# Patient Record
Sex: Female | Born: 1940 | Race: White | Hispanic: No | Smoking: Former smoker
Health system: Southern US, Community
[De-identification: ages and names within clinical notes are randomized; demographics above are authoritative.]

## PROBLEM LIST (undated history)

## (undated) DIAGNOSIS — M7501 Adhesive capsulitis of right shoulder: Secondary | ICD-10-CM

## (undated) DIAGNOSIS — E785 Hyperlipidemia, unspecified: Secondary | ICD-10-CM

## (undated) DIAGNOSIS — J302 Other seasonal allergic rhinitis: Secondary | ICD-10-CM

## (undated) DIAGNOSIS — G4730 Sleep apnea, unspecified: Secondary | ICD-10-CM

## (undated) DIAGNOSIS — I10 Essential (primary) hypertension: Secondary | ICD-10-CM

## (undated) DIAGNOSIS — E119 Type 2 diabetes mellitus without complications: Secondary | ICD-10-CM

## (undated) DIAGNOSIS — M75110 Incomplete rotator cuff tear or rupture of unspecified shoulder, not specified as traumatic: Secondary | ICD-10-CM

## (undated) DIAGNOSIS — E039 Hypothyroidism, unspecified: Secondary | ICD-10-CM

## (undated) DIAGNOSIS — R3915 Urgency of urination: Secondary | ICD-10-CM

## (undated) DIAGNOSIS — I739 Peripheral vascular disease, unspecified: Secondary | ICD-10-CM

## (undated) DIAGNOSIS — M1712 Unilateral primary osteoarthritis, left knee: Secondary | ICD-10-CM

## (undated) DIAGNOSIS — Z973 Presence of spectacles and contact lenses: Secondary | ICD-10-CM

## (undated) DIAGNOSIS — T4145XA Adverse effect of unspecified anesthetic, initial encounter: Secondary | ICD-10-CM

## (undated) DIAGNOSIS — M1990 Unspecified osteoarthritis, unspecified site: Secondary | ICD-10-CM

## (undated) DIAGNOSIS — G629 Polyneuropathy, unspecified: Secondary | ICD-10-CM

## (undated) DIAGNOSIS — K219 Gastro-esophageal reflux disease without esophagitis: Secondary | ICD-10-CM

## (undated) DIAGNOSIS — M8580 Other specified disorders of bone density and structure, unspecified site: Secondary | ICD-10-CM

## (undated) HISTORY — PX: ABDOMINAL HYSTERECTOMY: SHX81

## (undated) HISTORY — PX: NOSE SURGERY: SHX723

## (undated) HISTORY — PX: CARPAL TUNNEL RELEASE: SHX101

## (undated) HISTORY — PX: HEEL SPUR EXCISION: SHX1733

## (undated) HISTORY — DX: Type 2 diabetes mellitus without complications: E11.9

## (undated) HISTORY — DX: Incomplete rotator cuff tear or rupture of unspecified shoulder, not specified as traumatic: M75.110

## (undated) HISTORY — PX: TONSILLECTOMY: SUR1361

## (undated) HISTORY — DX: Adhesive capsulitis of right shoulder: M75.01

## (undated) HISTORY — PX: CERVICAL FUSION: SHX112

## (undated) HISTORY — DX: Unilateral primary osteoarthritis, left knee: M17.12

## (undated) HISTORY — PX: VARICOSE VEIN SURGERY: SHX832

## (undated) HISTORY — PX: KNEE ARTHROSCOPY: SUR90

## (undated) HISTORY — PX: COLONOSCOPY W/ POLYPECTOMY: SHX1380

---

## 2007-11-09 HISTORY — PX: HEMORRHOID SURGERY: SHX153

## 2007-11-09 HISTORY — PX: CHOLECYSTECTOMY: SHX55

## 2010-11-08 HISTORY — PX: EYE SURGERY: SHX253

## 2011-06-14 ENCOUNTER — Other Ambulatory Visit: Payer: Self-pay | Admitting: Neurological Surgery

## 2011-06-14 DIAGNOSIS — R200 Anesthesia of skin: Secondary | ICD-10-CM

## 2011-06-14 DIAGNOSIS — M549 Dorsalgia, unspecified: Secondary | ICD-10-CM

## 2011-06-14 DIAGNOSIS — M79605 Pain in left leg: Secondary | ICD-10-CM

## 2011-06-17 ENCOUNTER — Ambulatory Visit
Admission: RE | Admit: 2011-06-17 | Discharge: 2011-06-17 | Disposition: A | Discharge status: Home / Self Care | Payer: Medicare Other | Source: Ambulatory Visit | Attending: Neurological Surgery | Admitting: Neurological Surgery

## 2011-06-17 DIAGNOSIS — R200 Anesthesia of skin: Secondary | ICD-10-CM

## 2011-06-17 DIAGNOSIS — M549 Dorsalgia, unspecified: Secondary | ICD-10-CM

## 2011-06-17 DIAGNOSIS — M79605 Pain in left leg: Secondary | ICD-10-CM

## 2011-06-17 MED ORDER — DIAZEPAM 2 MG PO TABS: 5.0000 mg | ORAL_TABLET | Freq: Once | ORAL | Status: DC

## 2011-06-17 MED ORDER — ONDANSETRON HCL 4 MG/2ML IJ SOLN
4.0000 mg | Freq: Once | INTRAMUSCULAR | Status: AC
  Administered 2011-06-17: 4 mg via INTRAMUSCULAR

## 2011-06-17 MED ORDER — IOHEXOL 180 MG/ML  SOLN
17.0000 mL | Freq: Once | INTRAMUSCULAR | Status: AC | PRN
  Administered 2011-06-17: 17 mL via INTRATHECAL

## 2011-06-17 MED ORDER — SODIUM CHLORIDE 0.9 % IV SOLN: 4.0000 mg | Freq: Four times a day (QID) | INTRAVENOUS | Status: DC | PRN

## 2011-06-17 MED ORDER — MEPERIDINE HCL 100 MG/ML IJ SOLN
75.0000 mg | Freq: Once | INTRAMUSCULAR | Status: AC
  Administered 2011-06-17: 75 mg via INTRAMUSCULAR

## 2011-11-11 DIAGNOSIS — M542 Cervicalgia: Secondary | ICD-10-CM | POA: Diagnosis not present

## 2011-11-11 DIAGNOSIS — IMO0001 Reserved for inherently not codable concepts without codable children: Secondary | ICD-10-CM | POA: Diagnosis not present

## 2011-11-11 DIAGNOSIS — M5412 Radiculopathy, cervical region: Secondary | ICD-10-CM | POA: Diagnosis not present

## 2011-11-15 DIAGNOSIS — M5412 Radiculopathy, cervical region: Secondary | ICD-10-CM | POA: Diagnosis not present

## 2011-11-15 DIAGNOSIS — IMO0001 Reserved for inherently not codable concepts without codable children: Secondary | ICD-10-CM | POA: Diagnosis not present

## 2011-11-15 DIAGNOSIS — M542 Cervicalgia: Secondary | ICD-10-CM | POA: Diagnosis not present

## 2011-11-16 DIAGNOSIS — M5412 Radiculopathy, cervical region: Secondary | ICD-10-CM | POA: Diagnosis not present

## 2011-11-19 DIAGNOSIS — M259 Joint disorder, unspecified: Secondary | ICD-10-CM | POA: Diagnosis not present

## 2011-11-19 DIAGNOSIS — M4802 Spinal stenosis, cervical region: Secondary | ICD-10-CM | POA: Diagnosis not present

## 2011-11-19 DIAGNOSIS — M5412 Radiculopathy, cervical region: Secondary | ICD-10-CM | POA: Diagnosis not present

## 2011-11-19 DIAGNOSIS — Z981 Arthrodesis status: Secondary | ICD-10-CM | POA: Diagnosis not present

## 2011-11-23 DIAGNOSIS — M5412 Radiculopathy, cervical region: Secondary | ICD-10-CM | POA: Diagnosis not present

## 2011-12-17 DIAGNOSIS — M47817 Spondylosis without myelopathy or radiculopathy, lumbosacral region: Secondary | ICD-10-CM | POA: Diagnosis not present

## 2011-12-17 DIAGNOSIS — M5412 Radiculopathy, cervical region: Secondary | ICD-10-CM | POA: Diagnosis not present

## 2011-12-20 ENCOUNTER — Other Ambulatory Visit: Payer: Self-pay | Admitting: Neurological Surgery

## 2011-12-20 DIAGNOSIS — M5410 Radiculopathy, site unspecified: Secondary | ICD-10-CM

## 2011-12-27 ENCOUNTER — Ambulatory Visit
Admission: RE | Admit: 2011-12-27 | Discharge: 2011-12-27 | Disposition: A | Discharge status: Home / Self Care | Payer: Medicare Other | Source: Ambulatory Visit | Attending: Neurological Surgery | Admitting: Neurological Surgery

## 2011-12-27 DIAGNOSIS — E119 Type 2 diabetes mellitus without complications: Secondary | ICD-10-CM | POA: Diagnosis not present

## 2011-12-27 DIAGNOSIS — M5410 Radiculopathy, site unspecified: Secondary | ICD-10-CM

## 2011-12-27 DIAGNOSIS — M5020 Other cervical disc displacement, unspecified cervical region: Secondary | ICD-10-CM | POA: Diagnosis not present

## 2011-12-27 DIAGNOSIS — M47812 Spondylosis without myelopathy or radiculopathy, cervical region: Secondary | ICD-10-CM | POA: Diagnosis not present

## 2011-12-27 DIAGNOSIS — E785 Hyperlipidemia, unspecified: Secondary | ICD-10-CM | POA: Diagnosis not present

## 2011-12-27 DIAGNOSIS — E079 Disorder of thyroid, unspecified: Secondary | ICD-10-CM | POA: Diagnosis not present

## 2011-12-27 DIAGNOSIS — I10 Essential (primary) hypertension: Secondary | ICD-10-CM | POA: Diagnosis not present

## 2011-12-27 DIAGNOSIS — M5030 Other cervical disc degeneration, unspecified cervical region: Secondary | ICD-10-CM | POA: Diagnosis not present

## 2011-12-27 MED ORDER — ONDANSETRON HCL 4 MG/2ML IJ SOLN: 4.0000 mg | Freq: Four times a day (QID) | INTRAMUSCULAR | Status: DC | PRN

## 2011-12-27 MED ORDER — DIAZEPAM 5 MG PO TABS
5.0000 mg | ORAL_TABLET | Freq: Once | ORAL | Status: AC
  Administered 2011-12-27: 5 mg via ORAL

## 2011-12-27 NOTE — Discharge Instructions (Signed)

## 2012-01-12 DIAGNOSIS — R1013 Epigastric pain: Secondary | ICD-10-CM | POA: Diagnosis not present

## 2012-01-12 DIAGNOSIS — K219 Gastro-esophageal reflux disease without esophagitis: Secondary | ICD-10-CM | POA: Diagnosis not present

## 2012-01-12 DIAGNOSIS — R143 Flatulence: Secondary | ICD-10-CM | POA: Diagnosis not present

## 2012-01-12 DIAGNOSIS — K5900 Constipation, unspecified: Secondary | ICD-10-CM | POA: Diagnosis not present

## 2012-01-12 DIAGNOSIS — R109 Unspecified abdominal pain: Secondary | ICD-10-CM | POA: Diagnosis not present

## 2012-02-08 DIAGNOSIS — R1084 Generalized abdominal pain: Secondary | ICD-10-CM | POA: Diagnosis not present

## 2012-02-08 DIAGNOSIS — R141 Gas pain: Secondary | ICD-10-CM | POA: Diagnosis not present

## 2012-02-08 DIAGNOSIS — R142 Eructation: Secondary | ICD-10-CM | POA: Diagnosis not present

## 2012-02-08 DIAGNOSIS — K219 Gastro-esophageal reflux disease without esophagitis: Secondary | ICD-10-CM | POA: Diagnosis not present

## 2012-02-08 DIAGNOSIS — K589 Irritable bowel syndrome without diarrhea: Secondary | ICD-10-CM | POA: Diagnosis not present

## 2012-02-18 DIAGNOSIS — M779 Enthesopathy, unspecified: Secondary | ICD-10-CM | POA: Diagnosis not present

## 2012-02-18 DIAGNOSIS — I10 Essential (primary) hypertension: Secondary | ICD-10-CM | POA: Diagnosis not present

## 2012-02-21 DIAGNOSIS — M4802 Spinal stenosis, cervical region: Secondary | ICD-10-CM | POA: Diagnosis not present

## 2012-02-23 ENCOUNTER — Encounter (HOSPITAL_COMMUNITY): Payer: Self-pay | Admitting: Pharmacy Technician

## 2012-03-01 ENCOUNTER — Encounter (HOSPITAL_COMMUNITY)
Admission: RE | Admit: 2012-03-01 | Discharge: 2012-03-01 | Disposition: A | Discharge status: Home / Self Care | Payer: Medicare Other | Source: Ambulatory Visit | Attending: Anesthesiology | Admitting: Anesthesiology

## 2012-03-01 ENCOUNTER — Encounter (HOSPITAL_COMMUNITY)
Admission: RE | Admit: 2012-03-01 | Discharge: 2012-03-01 | Disposition: A | Discharge status: Home / Self Care | Payer: Medicare Other | Source: Ambulatory Visit | Attending: Neurological Surgery | Admitting: Neurological Surgery

## 2012-03-01 ENCOUNTER — Encounter (HOSPITAL_COMMUNITY): Payer: Self-pay

## 2012-03-01 DIAGNOSIS — M47812 Spondylosis without myelopathy or radiculopathy, cervical region: Secondary | ICD-10-CM | POA: Diagnosis not present

## 2012-03-01 DIAGNOSIS — Z79899 Other long term (current) drug therapy: Secondary | ICD-10-CM | POA: Diagnosis not present

## 2012-03-01 DIAGNOSIS — E119 Type 2 diabetes mellitus without complications: Secondary | ICD-10-CM | POA: Diagnosis not present

## 2012-03-01 DIAGNOSIS — I10 Essential (primary) hypertension: Secondary | ICD-10-CM | POA: Diagnosis not present

## 2012-03-01 DIAGNOSIS — Z01811 Encounter for preprocedural respiratory examination: Secondary | ICD-10-CM | POA: Diagnosis not present

## 2012-03-01 DIAGNOSIS — M5412 Radiculopathy, cervical region: Secondary | ICD-10-CM | POA: Diagnosis not present

## 2012-03-01 DIAGNOSIS — Z01812 Encounter for preprocedural laboratory examination: Secondary | ICD-10-CM | POA: Diagnosis not present

## 2012-03-01 HISTORY — DX: Other seasonal allergic rhinitis: J30.2

## 2012-03-01 HISTORY — DX: Hypothyroidism, unspecified: E03.9

## 2012-03-01 HISTORY — DX: Sleep apnea, unspecified: G47.30

## 2012-03-01 HISTORY — DX: Polyneuropathy, unspecified: G62.9

## 2012-03-01 HISTORY — DX: Peripheral vascular disease, unspecified: I73.9

## 2012-03-01 HISTORY — DX: Gastro-esophageal reflux disease without esophagitis: K21.9

## 2012-03-01 HISTORY — DX: Unspecified osteoarthritis, unspecified site: M19.90

## 2012-03-01 HISTORY — DX: Essential (primary) hypertension: I10

## 2012-03-01 LAB — CBC
HCT: 42.9 % (ref 36.0–46.0)
Hemoglobin: 13.9 g/dL (ref 12.0–15.0)
MCV: 91.9 fL (ref 78.0–100.0)
RDW: 14.4 % (ref 11.5–15.5)
WBC: 5.5 10*3/uL (ref 4.0–10.5)

## 2012-03-01 LAB — BASIC METABOLIC PANEL
CO2: 28 mEq/L (ref 19–32)
Chloride: 106 mEq/L (ref 96–112)
Creatinine, Ser: 0.91 mg/dL (ref 0.50–1.10)
Potassium: 3.8 mEq/L (ref 3.5–5.1)

## 2012-03-01 LAB — SURGICAL PCR SCREEN: Staphylococcus aureus: NEGATIVE

## 2012-03-01 NOTE — Pre-Procedure Instructions (Signed)
[address] Bulah Lurie  03/01/2012   Your procedure is scheduled on:  Friday May 3  Report to [facility] at 5:30 AM.  Call this number if you have problems the morning of surgery: [phone]   Remember:   Do not eat food:After Midnight.  May have clear liquids: up to 4 Hours before arrival.  Clear liquids include soda, tea, black coffee, apple or grape juice, broth.  Take these medicines the morning of surgery with A SIP OF WATER: Norvasc, Dexilant, Neurontin, Synthroid. May use Patanase.   Do not wear jewelry, make-up or nail polish.  Do not wear lotions, powders, or perfumes. You may wear deodorant.  Do not shave 48 hours prior to surgery.  Do not bring valuables to the hospital.  Contacts, dentures or bridgework may not be worn into surgery.  Leave suitcase in the car. After surgery it may be brought to your room.  For patients admitted to the hospital, checkout time is 11:00 AM the day of discharge.   Patients discharged the day of surgery will not be allowed to drive home.  Name and phone number of your driver: NA  Special Instructions: CHG Shower Use Special Wash: 1/2 bottle night before surgery and 1/2 bottle morning of surgery.   Please read over the following fact sheets that you were given: Pain Booklet, Coughing and Deep Breathing and Surgical Site Infection Prevention

## 2012-03-01 NOTE — Progress Notes (Signed)
Former PCP in [place], [place] was Dr. Titus Dubin. ECHO, stress test ordered by him.   Pulmonologist Dr. Jonette Pesa in [place], [place] for Sleep study.  Will request above records.

## 2012-03-02 ENCOUNTER — Other Ambulatory Visit: Payer: Self-pay | Admitting: Neurological Surgery

## 2012-03-03 NOTE — Consult Note (Signed)
Anesthesia Chart Review:  Patient is a 71 year old female posted for a one level ACDF on 03/10/12.  History includes DM2, hypothyroidism, HTN, former smoker, GERD, OSA, PVD, arthritis, peripheral neuropathy, obesity with BMI 41, multiple surgical procedures including prior c-spine fusion, nasal surgery, and hysterectomy.  PCP has been Dr. Titus Dubin from [place], [place].  Her current address now is listed in [place], [place], however.   Pulmonologist (for OSA) is Dr. Jonette Pesa in [place], [place].  Her last sleep study was in 2010.  She was noted to have a good response to nasal CPAP @ 14cm of water at that time.  PFTs were WNL on 08/05/10.  EKG from 03/01/12 shows NSR with sinus arrhythmia, incomplete right BBB, inferior infarct (age undetermined), possible anterior infarct (age undetermined).  No CV symptoms were documented at her PAT visit.  We received records from Dr. Christa See office and [facility] in [place] today.  She had a nuclear stress test on 05/03/05 that showed no ischemia and EF 70%.  There were no EKGs available from that time.  She does have an EKG from 07/18/09 and 01/13/11 which are probably not significantly changed.      CXR report from 03/01/12 shows: "1. No acute cardiopulmonary abnormality.  2. Evidence of mild L2 compression deformity.  Per CMS PQRS reporting requirements (PQRS Measure 24): Given the patient's age of greater than 50 and the fracture site (hip, distal radius, or spine), the patient should be tested for osteoporosis using DXA, and the appropriate treatment considered based on the DXA results."  This was faxed to Dr. Yetta Barre' office with confirmation.  Labs acceptable.  I reviewed above with Anesthesiologist Dr. Noreene Larsson.  If she remains asymptomatic, then plan to proceed.  Shonna Chock, PA-C

## 2012-03-08 DIAGNOSIS — T8859XA Other complications of anesthesia, initial encounter: Secondary | ICD-10-CM

## 2012-03-08 HISTORY — DX: Other complications of anesthesia, initial encounter: T88.59XA

## 2012-03-09 MED ORDER — DEXAMETHASONE SODIUM PHOSPHATE 10 MG/ML IJ SOLN
10.0000 mg | INTRAMUSCULAR | Status: DC
  Filled 2012-03-09: qty 1

## 2012-03-09 MED ORDER — CEFAZOLIN SODIUM 1-5 GM-% IV SOLN: 1.0000 g | INTRAVENOUS | Status: DC

## 2012-03-09 MED ORDER — CEFAZOLIN SODIUM-DEXTROSE 2-3 GM-% IV SOLR
2.0000 g | INTRAVENOUS | Status: DC
  Filled 2012-03-09: qty 50

## 2012-03-10 ENCOUNTER — Encounter (HOSPITAL_COMMUNITY): Payer: Self-pay | Admitting: Neurological Surgery

## 2012-03-10 ENCOUNTER — Encounter (HOSPITAL_COMMUNITY)
Admission: RE | Disposition: A | Discharge status: Home / Self Care | Payer: Self-pay | Source: Ambulatory Visit | Attending: Neurological Surgery

## 2012-03-10 ENCOUNTER — Inpatient Hospital Stay (HOSPITAL_COMMUNITY)
Admission: RE | Admit: 2012-03-10 | Discharge: 2012-03-11 | DRG: 473 | Disposition: A | Discharge status: Home / Self Care | Payer: Medicare Other | Source: Ambulatory Visit | Attending: Neurological Surgery | Admitting: Neurological Surgery

## 2012-03-10 ENCOUNTER — Encounter (HOSPITAL_COMMUNITY): Payer: Self-pay | Admitting: Vascular Surgery

## 2012-03-10 ENCOUNTER — Inpatient Hospital Stay (HOSPITAL_COMMUNITY): Payer: Medicare Other | Admitting: Vascular Surgery

## 2012-03-10 ENCOUNTER — Encounter (HOSPITAL_COMMUNITY): Payer: Self-pay | Admitting: *Deleted

## 2012-03-10 ENCOUNTER — Inpatient Hospital Stay (HOSPITAL_COMMUNITY): Payer: Medicare Other

## 2012-03-10 DIAGNOSIS — I10 Essential (primary) hypertension: Secondary | ICD-10-CM | POA: Diagnosis present

## 2012-03-10 DIAGNOSIS — Z9849 Cataract extraction status, unspecified eye: Secondary | ICD-10-CM | POA: Diagnosis not present

## 2012-03-10 DIAGNOSIS — E039 Hypothyroidism, unspecified: Secondary | ICD-10-CM | POA: Diagnosis present

## 2012-03-10 DIAGNOSIS — Z981 Arthrodesis status: Secondary | ICD-10-CM | POA: Diagnosis not present

## 2012-03-10 DIAGNOSIS — Z01812 Encounter for preprocedural laboratory examination: Secondary | ICD-10-CM | POA: Diagnosis not present

## 2012-03-10 DIAGNOSIS — Z01810 Encounter for preprocedural cardiovascular examination: Secondary | ICD-10-CM | POA: Diagnosis not present

## 2012-03-10 DIAGNOSIS — M4802 Spinal stenosis, cervical region: Secondary | ICD-10-CM | POA: Diagnosis not present

## 2012-03-10 DIAGNOSIS — M5412 Radiculopathy, cervical region: Secondary | ICD-10-CM | POA: Diagnosis present

## 2012-03-10 DIAGNOSIS — G4730 Sleep apnea, unspecified: Secondary | ICD-10-CM | POA: Diagnosis present

## 2012-03-10 DIAGNOSIS — I739 Peripheral vascular disease, unspecified: Secondary | ICD-10-CM | POA: Diagnosis present

## 2012-03-10 DIAGNOSIS — M542 Cervicalgia: Secondary | ICD-10-CM | POA: Diagnosis not present

## 2012-03-10 DIAGNOSIS — Z87891 Personal history of nicotine dependence: Secondary | ICD-10-CM

## 2012-03-10 DIAGNOSIS — K219 Gastro-esophageal reflux disease without esophagitis: Secondary | ICD-10-CM | POA: Diagnosis present

## 2012-03-10 DIAGNOSIS — E119 Type 2 diabetes mellitus without complications: Secondary | ICD-10-CM | POA: Diagnosis present

## 2012-03-10 DIAGNOSIS — M47812 Spondylosis without myelopathy or radiculopathy, cervical region: Principal | ICD-10-CM | POA: Diagnosis present

## 2012-03-10 DIAGNOSIS — M129 Arthropathy, unspecified: Secondary | ICD-10-CM | POA: Diagnosis present

## 2012-03-10 DIAGNOSIS — Z79899 Other long term (current) drug therapy: Secondary | ICD-10-CM | POA: Diagnosis not present

## 2012-03-10 DIAGNOSIS — M79609 Pain in unspecified limb: Secondary | ICD-10-CM | POA: Diagnosis not present

## 2012-03-10 DIAGNOSIS — M5020 Other cervical disc displacement, unspecified cervical region: Secondary | ICD-10-CM | POA: Diagnosis not present

## 2012-03-10 HISTORY — PX: ANTERIOR CERVICAL DECOMP/DISCECTOMY FUSION: SHX1161

## 2012-03-10 LAB — GLUCOSE, CAPILLARY
Glucose-Capillary: 126 mg/dL — ABNORMAL HIGH (ref 70–99)
Glucose-Capillary: 212 mg/dL — ABNORMAL HIGH (ref 70–99)
Glucose-Capillary: 151 mg/dL — ABNORMAL HIGH (ref 70–99)

## 2012-03-10 LAB — PROTIME-INR: INR: 0.97 (ref 0.00–1.49)

## 2012-03-10 SURGERY — ANTERIOR CERVICAL DECOMPRESSION/DISCECTOMY FUSION 1 LEVEL
Anesthesia: General | Site: Neck | Wound class: Clean

## 2012-03-10 MED ORDER — ONDANSETRON HCL 4 MG/2ML IJ SOLN
INTRAMUSCULAR | Status: DC | PRN
  Administered 2012-03-10: 4 mg via INTRAVENOUS

## 2012-03-10 MED ORDER — ACETAMINOPHEN 325 MG PO TABS: 650.0000 mg | ORAL_TABLET | ORAL | Status: DC | PRN

## 2012-03-10 MED ORDER — BACITRACIN 50000 UNITS IM SOLR
INTRAMUSCULAR | Status: AC
  Filled 2012-03-10: qty 1

## 2012-03-10 MED ORDER — HYDROCODONE-ACETAMINOPHEN 5-325 MG PO TABS
1.0000 | ORAL_TABLET | ORAL | Status: DC | PRN
  Administered 2012-03-10 (×2): 2 via ORAL
  Filled 2012-03-10 (×2): qty 2

## 2012-03-10 MED ORDER — METFORMIN HCL ER 500 MG PO TB24
500.0000 mg | ORAL_TABLET | Freq: Every day | ORAL | Status: DC
  Filled 2012-03-10 (×2): qty 1

## 2012-03-10 MED ORDER — HEMOSTATIC AGENTS (NO CHARGE) OPTIME
TOPICAL | Status: DC | PRN
  Administered 2012-03-10: 1 via TOPICAL

## 2012-03-10 MED ORDER — 0.9 % SODIUM CHLORIDE (POUR BTL) OPTIME
TOPICAL | Status: DC | PRN
  Administered 2012-03-10: 1000 mL

## 2012-03-10 MED ORDER — EPHEDRINE SULFATE 50 MG/ML IJ SOLN
INTRAMUSCULAR | Status: DC | PRN
  Administered 2012-03-10 (×2): 10 mg via INTRAVENOUS
  Administered 2012-03-10: 5 mg via INTRAVENOUS
  Administered 2012-03-10 (×3): 10 mg via INTRAVENOUS

## 2012-03-10 MED ORDER — HYDROMORPHONE HCL PF 1 MG/ML IJ SOLN: 0.2500 mg | INTRAMUSCULAR | Status: DC | PRN

## 2012-03-10 MED ORDER — GABAPENTIN 300 MG PO CAPS
300.0000 mg | ORAL_CAPSULE | Freq: Every day | ORAL | Status: DC
  Filled 2012-03-10: qty 1

## 2012-03-10 MED ORDER — LACTATED RINGERS IV SOLN
INTRAVENOUS | Status: DC | PRN
  Administered 2012-03-10 (×2): via INTRAVENOUS

## 2012-03-10 MED ORDER — SODIUM CHLORIDE 0.9 % IJ SOLN: 3.0000 mL | INTRAMUSCULAR | Status: DC | PRN

## 2012-03-10 MED ORDER — HYDROCHLOROTHIAZIDE 12.5 MG PO CAPS
12.5000 mg | ORAL_CAPSULE | Freq: Every day | ORAL | Status: DC
  Administered 2012-03-10: 12.5 mg via ORAL
  Filled 2012-03-10 (×2): qty 1

## 2012-03-10 MED ORDER — LIDOCAINE HCL (CARDIAC) 20 MG/ML IV SOLN
INTRAVENOUS | Status: DC | PRN
  Administered 2012-03-10: 70 mg via INTRAVENOUS

## 2012-03-10 MED ORDER — MIDAZOLAM HCL 5 MG/5ML IJ SOLN
INTRAMUSCULAR | Status: DC | PRN
  Administered 2012-03-10: 2 mg via INTRAVENOUS

## 2012-03-10 MED ORDER — LIRAGLUTIDE 18 MG/3ML ~~LOC~~ SOLN: 18.0000 mg | Freq: Every day | SUBCUTANEOUS | Status: DC

## 2012-03-10 MED ORDER — DEXAMETHASONE 4 MG PO TABS: 4.0000 mg | ORAL_TABLET | Freq: Four times a day (QID) | ORAL | Status: DC

## 2012-03-10 MED ORDER — THROMBIN 5000 UNITS EX KIT
PACK | CUTANEOUS | Status: DC | PRN
  Administered 2012-03-10 (×2): 5000 [IU] via TOPICAL

## 2012-03-10 MED ORDER — FENTANYL CITRATE 0.05 MG/ML IJ SOLN
INTRAMUSCULAR | Status: DC | PRN
  Administered 2012-03-10 (×2): 100 ug via INTRAVENOUS
  Administered 2012-03-10: 50 ug via INTRAVENOUS

## 2012-03-10 MED ORDER — DEXAMETHASONE SODIUM PHOSPHATE 4 MG/ML IJ SOLN: 4.0000 mg | Freq: Four times a day (QID) | INTRAMUSCULAR | Status: DC

## 2012-03-10 MED ORDER — IRBESARTAN 75 MG PO TABS
75.0000 mg | ORAL_TABLET | Freq: Every day | ORAL | Status: DC
  Administered 2012-03-10: 75 mg via ORAL
  Filled 2012-03-10 (×2): qty 1

## 2012-03-10 MED ORDER — ACETAMINOPHEN 650 MG RE SUPP: 650.0000 mg | RECTAL | Status: DC | PRN

## 2012-03-10 MED ORDER — THROMBIN 5000 UNITS EX SOLR
OROMUCOSAL | Status: DC | PRN
  Administered 2012-03-10: 09:00:00 via TOPICAL

## 2012-03-10 MED ORDER — BUPIVACAINE HCL (PF) 0.25 % IJ SOLN
INTRAMUSCULAR | Status: DC | PRN
  Administered 2012-03-10: 4 mL

## 2012-03-10 MED ORDER — CEFAZOLIN SODIUM 1-5 GM-% IV SOLN
1.0000 g | Freq: Three times a day (TID) | INTRAVENOUS | Status: AC
Start: 2012-03-10 — End: 2012-03-11
  Administered 2012-03-10 – 2012-03-11 (×2): 1 g via INTRAVENOUS
  Filled 2012-03-10 (×2): qty 50

## 2012-03-10 MED ORDER — SODIUM CHLORIDE 0.9 % IV SOLN
INTRAVENOUS | Status: AC
  Filled 2012-03-10: qty 500

## 2012-03-10 MED ORDER — LEVOTHYROXINE SODIUM 100 MCG PO TABS
100.0000 ug | ORAL_TABLET | Freq: Every day | ORAL | Status: DC
  Administered 2012-03-11: 100 ug via ORAL
  Filled 2012-03-10 (×2): qty 1

## 2012-03-10 MED ORDER — PHENOL 1.4 % MT LIQD: 1.0000 | OROMUCOSAL | Status: DC | PRN

## 2012-03-10 MED ORDER — FENOFIBRATE 160 MG PO TABS
160.0000 mg | ORAL_TABLET | Freq: Every day | ORAL | Status: DC
  Administered 2012-03-10: 160 mg via ORAL
  Filled 2012-03-10 (×2): qty 1

## 2012-03-10 MED ORDER — ONDANSETRON HCL 4 MG/2ML IJ SOLN: 4.0000 mg | Freq: Once | INTRAMUSCULAR | Status: DC | PRN

## 2012-03-10 MED ORDER — HYDROMORPHONE HCL PF 1 MG/ML IJ SOLN: 0.5000 mg | INTRAMUSCULAR | Status: DC | PRN

## 2012-03-10 MED ORDER — MENTHOL 3 MG MT LOZG: 1.0000 | LOZENGE | OROMUCOSAL | Status: DC | PRN

## 2012-03-10 MED ORDER — PANTOPRAZOLE SODIUM 40 MG PO TBEC: 40.0000 mg | DELAYED_RELEASE_TABLET | Freq: Every day | ORAL | Status: DC

## 2012-03-10 MED ORDER — ONDANSETRON HCL 4 MG/2ML IJ SOLN: 4.0000 mg | INTRAMUSCULAR | Status: DC | PRN

## 2012-03-10 MED ORDER — SODIUM CHLORIDE 0.9 % IR SOLN
Status: DC | PRN
  Administered 2012-03-10: 09:00:00

## 2012-03-10 MED ORDER — ROCURONIUM BROMIDE 100 MG/10ML IV SOLN
INTRAVENOUS | Status: DC | PRN
  Administered 2012-03-10: 50 mg via INTRAVENOUS

## 2012-03-10 MED ORDER — LIRAGLUTIDE 18 MG/3ML ~~LOC~~ SOLN
1.2000 mg | Freq: Every day | SUBCUTANEOUS | Status: DC
  Administered 2012-03-10: 1.2 mg via SUBCUTANEOUS

## 2012-03-10 MED ORDER — GLYCOPYRROLATE 0.2 MG/ML IJ SOLN
INTRAMUSCULAR | Status: DC | PRN
  Administered 2012-03-10: .7 mg via INTRAVENOUS

## 2012-03-10 MED ORDER — POTASSIUM CHLORIDE IN NACL 20-0.9 MEQ/L-% IV SOLN
INTRAVENOUS | Status: DC
  Administered 2012-03-10: 14:00:00 via INTRAVENOUS
  Filled 2012-03-10 (×3): qty 1000

## 2012-03-10 MED ORDER — PROPOFOL 10 MG/ML IV EMUL
INTRAVENOUS | Status: DC | PRN
  Administered 2012-03-10: 200 mg via INTRAVENOUS

## 2012-03-10 MED ORDER — AMLODIPINE BESYLATE 10 MG PO TABS
10.0000 mg | ORAL_TABLET | Freq: Every day | ORAL | Status: DC
  Filled 2012-03-10: qty 1

## 2012-03-10 MED ORDER — SODIUM CHLORIDE 0.9 % IJ SOLN
3.0000 mL | Freq: Two times a day (BID) | INTRAMUSCULAR | Status: DC
  Administered 2012-03-10 (×2): 3 mL via INTRAVENOUS

## 2012-03-10 MED ORDER — NEOSTIGMINE METHYLSULFATE 1 MG/ML IJ SOLN
INTRAMUSCULAR | Status: DC | PRN
  Administered 2012-03-10: 4 mg via INTRAVENOUS

## 2012-03-10 MED ORDER — NALOXONE HCL 0.4 MG/ML IJ SOLN
INTRAMUSCULAR | Status: DC | PRN
  Administered 2012-03-10: .1 mg via INTRAVENOUS
  Administered 2012-03-10: 0.1 mg via INTRAVENOUS

## 2012-03-10 SURGICAL SUPPLY — 55 items
BAG DECANTER FOR FLEXI CONT (MISCELLANEOUS) ×2 IMPLANT
BENZOIN TINCTURE PRP APPL 2/3 (GAUZE/BANDAGES/DRESSINGS) ×2 IMPLANT
BUR MATCHSTICK NEURO 3.0 LAGG (BURR) ×2 IMPLANT
CANISTER SUCTION 2500CC (MISCELLANEOUS) ×2 IMPLANT
CLOTH BEACON ORANGE TIMEOUT ST (SAFETY) ×2 IMPLANT
CONT SPEC 4OZ CLIKSEAL STRL BL (MISCELLANEOUS) ×2 IMPLANT
CoRoent Cage 9x13x15 ×2 IMPLANT
DRAPE C-ARM 42X72 X-RAY (DRAPES) ×4 IMPLANT
DRAPE LAPAROTOMY 100X72 PEDS (DRAPES) ×2 IMPLANT
DRAPE MICROSCOPE ZEISS OPMI (DRAPES) ×2 IMPLANT
DRAPE POUCH INSTRU U-SHP 10X18 (DRAPES) ×2 IMPLANT
DRESSING TELFA 8X3 (GAUZE/BANDAGES/DRESSINGS) ×2 IMPLANT
DRILL BIT HELIX (BIT) ×2 IMPLANT
DRSG OPSITE 4X5.5 SM (GAUZE/BANDAGES/DRESSINGS) ×2 IMPLANT
DURAPREP 6ML APPLICATOR 50/CS (WOUND CARE) ×2 IMPLANT
ELECT COATED BLADE 2.86 ST (ELECTRODE) ×2 IMPLANT
ELECT REM PT RETURN 9FT ADLT (ELECTROSURGICAL) ×2
ELECTRODE REM PT RTRN 9FT ADLT (ELECTROSURGICAL) ×1 IMPLANT
GAUZE SPONGE 4X4 16PLY XRAY LF (GAUZE/BANDAGES/DRESSINGS) IMPLANT
GLOVE BIO SURGEON STRL SZ8 (GLOVE) ×2 IMPLANT
GLOVE BIOGEL PI IND STRL 6.5 (GLOVE) ×1 IMPLANT
GLOVE BIOGEL PI IND STRL 7.0 (GLOVE) ×1 IMPLANT
GLOVE BIOGEL PI INDICATOR 6.5 (GLOVE) ×1
GLOVE BIOGEL PI INDICATOR 7.0 (GLOVE) ×1
GLOVE ECLIPSE 8.5 STRL (GLOVE) ×2 IMPLANT
GLOVE SS BIOGEL STRL SZ 6.5 (GLOVE) ×2 IMPLANT
GLOVE SUPERSENSE BIOGEL SZ 6.5 (GLOVE) ×2
GLOVE SURG SS PI 6.5 STRL IVOR (GLOVE) ×4 IMPLANT
GOWN BRE IMP SLV AUR LG STRL (GOWN DISPOSABLE) ×4 IMPLANT
GOWN BRE IMP SLV AUR XL STRL (GOWN DISPOSABLE) ×2 IMPLANT
GOWN STRL REIN 2XL LVL4 (GOWN DISPOSABLE) ×2 IMPLANT
HEMOSTAT POWDER KIT SURGIFOAM (HEMOSTASIS) ×2 IMPLANT
KIT BASIN OR (CUSTOM PROCEDURE TRAY) ×2 IMPLANT
KIT ROOM TURNOVER OR (KITS) ×2 IMPLANT
NEEDLE HYPO 25X1 1.5 SAFETY (NEEDLE) ×2 IMPLANT
NEEDLE SPNL 20GX3.5 QUINCKE YW (NEEDLE) ×2 IMPLANT
NS IRRIG 1000ML POUR BTL (IV SOLUTION) ×2 IMPLANT
PACK LAMINECTOMY NEURO (CUSTOM PROCEDURE TRAY) ×2 IMPLANT
PAD ARMBOARD 7.5X6 YLW CONV (MISCELLANEOUS) ×2 IMPLANT
PLATE HELIX-R 24MM (Plate) ×2 IMPLANT
PUTTY ABX ACTIFUSE 1.5ML (Putty) ×2 IMPLANT
RUBBERBAND STERILE (MISCELLANEOUS) ×4 IMPLANT
SCREW 4.0X13 (Screw) ×2 IMPLANT
SCREW 4.0X13MM (Screw) ×2 IMPLANT
SCREW HELIX R (Screw) ×4 IMPLANT
SPONGE GAUZE 4X4 12PLY (GAUZE/BANDAGES/DRESSINGS) ×2 IMPLANT
SPONGE INTESTINAL PEANUT (DISPOSABLE) ×2 IMPLANT
SPONGE SURGIFOAM ABS GEL SZ50 (HEMOSTASIS) ×2 IMPLANT
STRIP CLOSURE SKIN 1/2X4 (GAUZE/BANDAGES/DRESSINGS) ×2 IMPLANT
SUT VIC AB 3-0 SH 8-18 (SUTURE) ×4 IMPLANT
SYR 20ML ECCENTRIC (SYRINGE) IMPLANT
TOWEL OR 17X24 6PK STRL BLUE (TOWEL DISPOSABLE) ×2 IMPLANT
TOWEL OR 17X26 10 PK STRL BLUE (TOWEL DISPOSABLE) ×2 IMPLANT
TRAP SPECIMEN MUCOUS 40CC (MISCELLANEOUS) ×2 IMPLANT
WATER STERILE IRR 1000ML POUR (IV SOLUTION) ×2 IMPLANT

## 2012-03-10 NOTE — Anesthesia Postprocedure Evaluation (Signed)
  Anesthesia Post-op Note  Patient: Megan Horton  Procedure(s) Performed: Procedure(s) (LRB): ANTERIOR CERVICAL DECOMPRESSION/DISCECTOMY FUSION 1 LEVEL (N/A)  Patient Location: PACU  Anesthesia Type: General  Level of Consciousness: awake, alert , oriented and patient cooperative  Airway and Oxygen Therapy: Patient Spontanous Breathing and Patient connected to nasal cannula oxygen  Post-op Pain: mild  Post-op Assessment: Post-op Vital signs reviewed, Patient's Cardiovascular Status Stable, Respiratory Function Stable, Patent Airway, No signs of Nausea or vomiting and Pain level controlled  Post-op Vital Signs: stable  Complications: No apparent anesthesia complications

## 2012-03-10 NOTE — Op Note (Signed)
03/10/2012  9:29 AM  PATIENT:  Megan Horton  71 y.o. 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 Decompressive anterior cervical discectomy C5-6, 2. Anterior cervical arthrodesis C5-6 utilizing a 9 mm peek interbody cage packed with local autograft and morcellized allograft, 3. Anterior cervical plating C5-6 utilizing a NUvasive plate  SURGEON:  Marikay Alar, MD  ASSISTANTS: Dr. Jordan Likes  ANESTHESIA:   General  EBL: 20 ml  Total I/O In: 1000 [I.V.:1000] Out: -   BLOOD ADMINISTERED:none  DRAINS: None   SPECIMEN:  No Specimen  INDICATION FOR PROCEDURE: Patient presented with neck and right arm pain of long duration, unresponsive to medical therapy. CT myelogram showed calcified midline protrusion with probable OPLL and stenosis with cord compression. I recommended a decompressive anterior cervical discectomy fusion plating at C5-6. Patient understood the risks, benefits, and alternatives and potential outcomes and wished to proceed.  PROCEDURE DETAILS: Patient was brought to the operating room placed under general endotracheal anesthesia. Patient was placed in the supine position on the operating room table. The neck was prepped with Duraprep and draped in a sterile fashion.   Three cc of local anesthesia was injected and a transverse incision was made on the right side of the neck.  Dissection was carried down thru the subcutaneous tissue and the platysma was  elevated, opened, and undermined with Metzenbaum scissors.  Dissection was then carried out thru an avascular plane leaving the sternocleidomastoid carotid artery and jugular vein laterally and the trachea and esophagus medially. The ventral aspect of the vertebral column was identified and a localizing x-ray was taken. The C5-6 level was identified. The longus colli muscles were then elevated and the retractor  was placed. The annulus was incised and the disc space entered. Discectomy was performed with micro-curettes and pituitary rongeurs. I then used the high-speed drill to drill the endplates down to the level of the posterior longitudinal ligament. The drill shavings were saved in a mucous trap for later arthrodesis. The operating microscope was draped and brought into the field provided additional magnification, illumination and visualization. Discectomy was continued posteriorly thru the disc space. Posterior longitudinal ligament was opened with a nerve hook laterally on her right side and laterally on her left side, and then removed along with disc herniation and osteophytes, decompressing the spinal canal and thecal sac. There was a large midline calcified segment that was compressing the dura in the midline. We loosened this by undercutting the vertebral body superiorly and inferiorly, and working between it and the dura with a micro-nerve hook. Finally we were able to remove this calcified ligament and disc complex and the dura relaxed. We then continued to remove osteophytic overgrowth and disc material decompressing the neural foramina and exiting nerve roots bilaterally. The scope was angled up and down to help decompress and undercut the vertebral bodies. Once the decompression was completed we could pass a nerve hook circumferentially to assure adequate decompression in the midline and in the neural foramina. So by both visualization and palpation we felt we had an adequate decompression of the neural elements. We then measured the height of the intravertebral disc space and selected a 9 millimeter Peek interbody cage packed with autograft and morcellized allograft. It was then gently positioned in the intravertebral disc space and countersunk. I then used a 24 mm  plate and placed four variable angle screws into the vertebral bodies  and locked them into position. The wound was irrigated with bacitracin  solution, checked for hemostasis which was established and confirmed. Once meticulous hemostasis was achieved, we then proceeded with closure. The platysma was closed with interrupted 3-0 undyed Vicryl suture, the subcuticular layer was closed with interrupted 3-0 undyed Vicryl suture. The skin edges were approximated with steristrips. The drapes were removed. A sterile dressing was applied. The patient was then awakened from general anesthesia and transferred to the recovery room in stable condition. At the end of the procedure all sponge, needle and instrument counts were correct.   PLAN OF CARE: Admit to inpatient   PATIENT DISPOSITION:  PACU - hemodynamically stable.   Delay start of Pharmacological VTE agent (>24hrs) due to surgical blood loss or risk of bleeding:  yes

## 2012-03-10 NOTE — Anesthesia Procedure Notes (Signed)
Procedure Name: Intubation Date/Time: 03/10/2012 7:51 AM Performed by: Rossie Muskrat L Pre-anesthesia Checklist: Patient identified, Patient being monitored, Timeout performed, Emergency Drugs available and Suction available Patient Re-evaluated:Patient Re-evaluated prior to inductionOxygen Delivery Method: Circle system utilized Preoxygenation: Pre-oxygenation with 100% oxygen Intubation Type: IV induction Ventilation: Mask ventilation without difficulty and Oral airway inserted - appropriate to patient size Laryngoscope Size: Miller and 2 Grade View: Grade I Number of attempts: 1 Airway Equipment and Method: Stylet Placement Confirmation: ETT inserted through vocal cords under direct vision,  breath sounds checked- equal and bilateral and positive ETCO2 Secured at: 22 cm Tube secured with: Tape Dental Injury: Teeth and Oropharynx as per pre-operative assessment

## 2012-03-10 NOTE — Progress Notes (Signed)
Pt. Disoriented and swinging arms, slapping at staff, sats 89, on face mask at 10 liters, will follow commands.Marland Kitchen

## 2012-03-10 NOTE — Progress Notes (Signed)
Spoke with Dr. Yetta Barre about Diff and PT ordered that were not done at PAT since orders were put in after PAT appt. Dr. Yetta Barre was okay with Diff not being done but wanted PT done.

## 2012-03-10 NOTE — Anesthesia Preprocedure Evaluation (Addendum)
Anesthesia Evaluation  Patient identified by MRN, date of birth, ID band Patient awake    Reviewed: Allergy & Precautions, H&P , NPO status , Patient's Chart, lab work & pertinent test results  Airway Mallampati: III TM Distance: >3 FB Neck ROM: Full    Dental  (+) Teeth Intact   Pulmonary sleep apnea and Continuous Positive Airway Pressure Ventilation ,          Cardiovascular hypertension, Pt. on medications Rhythm:regular Rate:Normal     Neuro/Psych  Neuromuscular disease    GI/Hepatic GERD-  Medicated,  Endo/Other  Diabetes mellitus-, Well Controlled, Type 2Hypothyroidism   Renal/GU      Musculoskeletal   Abdominal   Peds  Hematology   Anesthesia Other Findings   Reproductive/Obstetrics                         Anesthesia Physical Anesthesia Plan  ASA: III  Anesthesia Plan: General   Post-op Pain Management:    Induction: Intravenous  Airway Management Planned: Oral ETT  Additional Equipment:   Intra-op Plan:   Post-operative Plan: Extubation in OR  Informed Consent: I have reviewed the patients History and Physical, chart, labs and discussed the procedure including the risks, benefits and alternatives for the proposed anesthesia with the patient or authorized representative who has indicated his/her understanding and acceptance.     Plan Discussed with: CRNA, Anesthesiologist and Surgeon  Anesthesia Plan Comments:         Anesthesia Quick Evaluation

## 2012-03-10 NOTE — Transfer of Care (Signed)
Immediate Anesthesia Transfer of Care Note  Patient: Megan Horton  Procedure(s) Performed: Procedure(s) (LRB): ANTERIOR CERVICAL DECOMPRESSION/DISCECTOMY FUSION 1 LEVEL (N/A)  Patient Location: PACU  Anesthesia Type: General  Level of Consciousness: awake, alert  and oriented  Airway & Oxygen Therapy: Patient Spontanous Breathing and Patient connected to face mask oxygen  Post-op Assessment: Report given to PACU RN, Post -op Vital signs reviewed and stable and Patient moving all extremities  Post vital signs: Reviewed and stable  Complications: No apparent anesthesia complications

## 2012-03-10 NOTE — Preoperative (Signed)
Beta Blockers   Reason not to administer Beta Blockers:Not Applicable 

## 2012-03-10 NOTE — Progress Notes (Signed)
UR COMPLETED  

## 2012-03-10 NOTE — Progress Notes (Signed)
pts sats 90 on 10 liter mask, Dr. Katrinka Blazing aware

## 2012-03-10 NOTE — H&P (Signed)
Subjective:   Patient is a 71 y.o. female admitted for ACDF. The patient first presented to me with complaints of neck pain. Onset of symptoms was several months ago. The pain is described as aching and throbbing and occurs intermittently. The pain is rated moderate, and is located at the neck and radiates to the arms. The symptoms have been progressive. Symptoms are exacerbated by extending head backwards, and are relieved by meds.  Previous work up includes MRI of cervical spine, results: spondylosis with stenosis.  Past Medical History  Diagnosis Date  . Hypertension   . Peripheral vascular disease   . Peripheral neuropathy   . Seasonal allergies   . Diabetes mellitus   . Hypothyroidism   . GERD (gastroesophageal reflux disease)   . Arthritis   . Sleep apnea     CPAP, sleep study 3-4 years ago in [place], [place] Dr. Horace Porteous     Past Surgical History  Procedure Date  . Abdominal hysterectomy   . Cholecystectomy 2009  . Hemorrhoid surgery 2009  . Eye surgery 2012    bilat cataract  . Knee arthroscopy     Left x2  . Carpal tunnel release     bilat  . Cervical fusion   . Tonsillectomy   . Varicose vein surgery     Right  . Heel spur excision     bilat  . Nose surgery     No Known Allergies  History  Substance Use Topics  . Smoking status: Former Smoker -- 0.2 packs/day for 16 years    Types: Cigarettes    Quit date: 12/26/1993  . Smokeless tobacco: Never Used  . Alcohol Use: No     very rarely wine    History reviewed. No pertinent family history. Prior to Admission medications   Medication Sig Start Date End Date Taking? Authorizing Provider  amLODipine (NORVASC) 10 MG tablet Take 10 mg by mouth daily.   Yes Historical Provider, MD  Cholecalciferol (VITAMIN D) 2000 UNITS CAPS Take 1 capsule by mouth daily.   Yes Historical Provider, MD  dexlansoprazole (DEXILANT) 60 MG capsule Take 60 mg by mouth daily.   Yes Historical Provider, MD  fenofibrate 160 MG  tablet Take 160 mg by mouth daily.   Yes Historical Provider, MD  gabapentin (NEURONTIN) 300 MG capsule Take 300 mg by mouth daily.   Yes Historical Provider, MD  hydrochlorothiazide (MICROZIDE) 12.5 MG capsule Take 12.5 mg by mouth daily.   Yes Historical Provider, MD  Lactobacillus-Inulin (CULTURELLE) CAPS Take 1 capsule by mouth daily.   Yes Historical Provider, MD  levothyroxine (SYNTHROID, LEVOTHROID) 100 MCG tablet Take 100 mcg by mouth daily.   Yes Historical Provider, MD  Liraglutide (VICTOZA) 18 MG/3ML SOLN Inject 18 mg into the skin daily.   Yes Historical Provider, MD  lovastatin (MEVACOR) 40 MG tablet Take 40 mg by mouth at bedtime.   Yes Historical Provider, MD  meloxicam (MOBIC) 7.5 MG tablet Take 7.5 mg by mouth daily.   Yes Historical Provider, MD  metFORMIN (GLUCOPHAGE-XR) 500 MG 24 hr tablet Take 500 mg by mouth daily with breakfast.   Yes Historical Provider, MD  Multiple Vitamins-Minerals (CENTRUM SILVER PO) Take 1 tablet by mouth daily.   Yes Historical Provider, MD  Olopatadine HCl (PATANASE NA) Place 1 spray into the nose 2 (two) times daily.   Yes Historical Provider, MD  Omega-3 Fatty Acids (FISH OIL) 1000 MG CAPS Take 1 capsule by mouth daily.   Yes Historical Provider, MD  telmisartan (MICARDIS) 80 MG tablet Take 80 mg by mouth daily.   Yes Historical Provider, MD  tetrahydrozoline (VISINE) 0.05 % ophthalmic solution Place 1 drop into both eyes 2 (two) times daily as needed. Allergy eyes   Yes Historical Provider, MD     Review of Systems  Positive ROS: neg  All other systems have been reviewed and were otherwise negative with the exception of those mentioned in the HPI and as above.  Objective: Vital signs in last 24 hours: Temp:  [97.9 F (36.6 C)] 97.9 F (36.6 C) (05/03 0602) Pulse Rate:  [63] 63  (05/03 0602) Resp:  [20] 20  (05/03 0602) BP: (133)/(69) 133/69 mmHg (05/03 0602) SpO2:  [96 %] 96 % (05/03 0602)  General Appearance: Alert, cooperative, no  distress, appears stated age Head: Normocephalic, without obvious abnormality, atraumatic Eyes: PERRL, conjunctiva/corneas clear, EOM's intact, fundi benign, both eyes      Ears: Normal TM's and external ear canals, both ears Throat: Lips, mucosa, and tongue normal; teeth and gums normal Neck: Supple, symmetrical, trachea midline, no adenopathy; thyroid: No enlargement/tenderness/nodules; no carotid bruit or JVD Back: Symmetric, no curvature, ROM normal, no CVA tenderness Lungs: Clear to auscultation bilaterally, respirations unlabored Heart: Regular rate and rhythm, S1 and S2 normal, no murmur, rub or gallop Abdomen: Soft, non-tender, bowel sounds active all four quadrants, no masses, no organomegaly Extremities: Extremities normal, atraumatic, no cyanosis or edema Pulses: 2+ and symmetric all extremities Skin: Skin color, texture, turgor normal, no rashes or lesions  NEUROLOGIC:  Mental status: Alert and oriented x4, no aphasia, good attention span, fund of knowledge and memory  Motor Exam - grossly normal Sensory Exam - grossly normal Reflexes: 1+ Coordination - grossly normal Gait - grossly normal Balance - grossly normal Cranial Nerves: I: smell Not tested  II: visual acuity  OS: nl    OD: nl  II: visual fields Full to confrontation  II: pupils Equal, round, reactive to light  III,VII: ptosis None  III,IV,VI: extraocular muscles  Full ROM  V: mastication Normal  V: facial light touch sensation  Normal  V,VII: corneal reflex  Present  VII: facial muscle function - upper  Normal  VII: facial muscle function - lower Normal  VIII: hearing Not tested  IX: soft palate elevation  Normal  IX,X: gag reflex Present  XI: trapezius strength  5/5  XI: sternocleidomastoid strength 5/5  XI: neck flexion strength  5/5  XII: tongue strength  Normal    Data Review Lab Results  Component Value Date   WBC 5.5 03/01/2012   HGB 13.9 03/01/2012   HCT 42.9 03/01/2012   MCV 91.9 03/01/2012     PLT 172 03/01/2012   Lab Results  Component Value Date   NA 143 03/01/2012   K 3.8 03/01/2012   CL 106 03/01/2012   CO2 28 03/01/2012   BUN 13 03/01/2012   CREATININE 0.91 03/01/2012   GLUCOSE 109* 03/01/2012   No results found for this basename: INR, PROTIME    Assessment:   Cervical neck pain with herniated nucleus pulposus/ spondylosis/ stenosis at C5-6. Patient has failed conservative therapy. Planned surgery : ACDF  Plan:   I explained the condition and procedure to the patient and answered any questions.  Patient wishes to proceed with procedure as planned. Understands risks/ benefits/ and expected or typical outcomes.  Megan Horton S 03/10/2012 7:12 AM

## 2012-03-11 MED ORDER — HYDROCODONE-ACETAMINOPHEN 5-325 MG PO TABS: 1.0000 | ORAL_TABLET | ORAL | Status: AC | PRN

## 2012-03-11 NOTE — Discharge Summary (Signed)
  Physician Discharge Summary  Patient ID: Danyle Boening MRN: [id number] DOB/AGE: February 18, 1941 71 y.o.  Admit date: 03/10/2012 Discharge date: 03/11/2012  Admission Diagnoses: Cervical spondylosis and stenosis with radiculopathy  Discharge Diagnoses: Same Active Problems:  * No active hospital problems. *    Discharged Condition: good  Hospital Course: Patient was admitted to the hospital underwent the aforementioned procedure postoperative patient did very well recovered in the floor on the floor she convalesced well was angling and voiding the posterior day 1 tolerating a regular diet arm pain was significantly improved and is is clean and dry she was having some mild swelling difficulty but still unable to tolerate her diet the  Consults: Significant Diagnostic Studies: Treatments: ACDF Discharge Exam: Blood pressure 109/50, pulse 83, temperature 98.1 F (36.7 C), temperature source Oral, resp. rate 16, SpO2 95.00%. Strength out of 5 wound clean and dry  Disposition: Home   Medication List  As of 03/11/2012  7:09 AM   TAKE these medications         amLODipine 10 MG tablet   Commonly known as: NORVASC   Take 10 mg by mouth daily.      CENTRUM SILVER PO   Take 1 tablet by mouth daily.      CULTURELLE Caps   Take 1 capsule by mouth daily.      dexlansoprazole 60 MG capsule   Commonly known as: DEXILANT   Take 60 mg by mouth daily.      fenofibrate 160 MG tablet   Take 160 mg by mouth daily.      Fish Oil 1000 MG Caps   Take 1 capsule by mouth daily.      gabapentin 300 MG capsule   Commonly known as: NEURONTIN   Take 300 mg by mouth daily.      hydrochlorothiazide 12.5 MG capsule   Commonly known as: MICROZIDE   Take 12.5 mg by mouth daily.      HYDROcodone-acetaminophen 5-325 MG per tablet   Commonly known as: NORCO   Take 1-2 tablets by mouth every 4 (four) hours as needed.      levothyroxine 100 MCG tablet   Commonly known as: SYNTHROID, LEVOTHROID   Take  100 mcg by mouth daily.      lovastatin 40 MG tablet   Commonly known as: MEVACOR   Take 40 mg by mouth at bedtime.      meloxicam 7.5 MG tablet   Commonly known as: MOBIC   Take 7.5 mg by mouth daily.      metFORMIN 500 MG 24 hr tablet   Commonly known as: GLUCOPHAGE-XR   Take 500 mg by mouth daily with breakfast.      PATANASE NA   Place 1 spray into the nose 2 (two) times daily.      telmisartan 80 MG tablet   Commonly known as: MICARDIS   Take 80 mg by mouth daily.      VICTOZA 18 MG/3ML Soln   Generic drug: Liraglutide   Inject 18 mg into the skin daily.      VISINE 0.05 % ophthalmic solution   Generic drug: tetrahydrozoline   Place 1 drop into both eyes 2 (two) times daily as needed. Allergy eyes      Vitamin D 2000 UNITS Caps   Take 1 capsule by mouth daily.             Signed: Lamine Laton P 03/11/2012, 7:09 AM

## 2012-03-11 NOTE — Progress Notes (Signed)
Subjective: Patient reports She's feeling a lot better this morning her pain specialist will do pain in the right knuckle and CT of her little finger.  Objective: Vital signs in last 24 hours: Temp:  [97.5 F (36.4 C)-98.2 F (36.8 C)] 98.1 F (36.7 C) (05/04 0400) Pulse Rate:  [82-96] 83  (05/04 0400) Resp:  [16-28] 16  (05/04 0400) BP: (102-144)/(43-73) 109/50 mmHg (05/04 0400) SpO2:  [91 %-96 %] 95 % (05/04 0400)  Intake/Output from previous day: 05/03 0701 - 05/04 0700 In: 2120 [P.O.:720; I.V.:1400] Out: 25 [Blood:25] Intake/Output this shift:    Strength 5 out of 5 wound clean and dry. Assessment incisional soreness but no swelling.  Lab Results: No results found for this basename: WBC:2,HGB:2,HCT:2,PLT:2 in the last 72 hours BMET No results found for this basename: NA:2,K:2,CL:2,CO2:2,GLUCOSE:2,BUN:2,CREATININE:2,CALCIUM:2 in the last 72 hours  Studies/Results: Dg Cervical Spine 2-3 Views  03/10/2012  *RADIOLOGY REPORT*  Clinical Data: C5-C6 ACDF  CERVICAL SPINE - 2-3 VIEW,DG C-ARM 1-60 MIN  Comparison: CT scan dated December 27, 2011  Findings: There is anterior orthopedic fusion of C5-C6 with normal alignment laterally.  14 seconds of fluoroscopy time were utilized.  IMPRESSION: Anterior fusion of C5-C6.  Original Report Authenticated By: Brandon Melnick, M.D.   Dg C-arm 1-60 Min  03/10/2012  *RADIOLOGY REPORT*  Clinical Data: C5-C6 ACDF  CERVICAL SPINE - 2-3 VIEW,DG C-ARM 1-60 MIN  Comparison: CT scan dated December 27, 2011  Findings: There is anterior orthopedic fusion of C5-C6 with normal alignment laterally.  14 seconds of fluoroscopy time were utilized.  IMPRESSION: Anterior fusion of C5-C6.  Original Report Authenticated By: Brandon Melnick, M.D.    Assessment/Plan: Posterior they want an ACDF done very well discharged home this morning scheduled followup in the proximal one 2 weeks Dr. Yetta Barre.  LOS: 1 day     Christoher Drudge P 03/11/2012, 7:06 AM

## 2012-03-13 ENCOUNTER — Encounter (HOSPITAL_COMMUNITY): Payer: Self-pay | Admitting: Neurological Surgery

## 2012-04-25 ENCOUNTER — Other Ambulatory Visit: Payer: Self-pay | Admitting: Neurological Surgery

## 2012-04-25 DIAGNOSIS — M47812 Spondylosis without myelopathy or radiculopathy, cervical region: Secondary | ICD-10-CM | POA: Diagnosis not present

## 2012-04-29 ENCOUNTER — Ambulatory Visit
Admission: RE | Admit: 2012-04-29 | Discharge: 2012-04-29 | Disposition: A | Discharge status: Home / Self Care | Payer: Medicare Other | Source: Ambulatory Visit | Attending: Neurological Surgery | Admitting: Neurological Surgery

## 2012-04-29 DIAGNOSIS — M19019 Primary osteoarthritis, unspecified shoulder: Secondary | ICD-10-CM | POA: Diagnosis not present

## 2012-04-29 DIAGNOSIS — M67919 Unspecified disorder of synovium and tendon, unspecified shoulder: Secondary | ICD-10-CM | POA: Diagnosis not present

## 2012-04-29 DIAGNOSIS — M25519 Pain in unspecified shoulder: Secondary | ICD-10-CM | POA: Diagnosis not present

## 2012-04-29 DIAGNOSIS — M719 Bursopathy, unspecified: Secondary | ICD-10-CM | POA: Diagnosis not present

## 2012-04-29 DIAGNOSIS — M47812 Spondylosis without myelopathy or radiculopathy, cervical region: Secondary | ICD-10-CM

## 2012-05-04 DIAGNOSIS — M75110 Incomplete rotator cuff tear or rupture of unspecified shoulder, not specified as traumatic: Secondary | ICD-10-CM | POA: Diagnosis not present

## 2012-05-09 DIAGNOSIS — M719 Bursopathy, unspecified: Secondary | ICD-10-CM | POA: Diagnosis not present

## 2012-05-09 DIAGNOSIS — IMO0001 Reserved for inherently not codable concepts without codable children: Secondary | ICD-10-CM | POA: Diagnosis not present

## 2012-05-12 DIAGNOSIS — M67919 Unspecified disorder of synovium and tendon, unspecified shoulder: Secondary | ICD-10-CM | POA: Diagnosis not present

## 2012-05-12 DIAGNOSIS — IMO0001 Reserved for inherently not codable concepts without codable children: Secondary | ICD-10-CM | POA: Diagnosis not present

## 2012-05-16 DIAGNOSIS — M719 Bursopathy, unspecified: Secondary | ICD-10-CM | POA: Diagnosis not present

## 2012-05-16 DIAGNOSIS — IMO0001 Reserved for inherently not codable concepts without codable children: Secondary | ICD-10-CM | POA: Diagnosis not present

## 2012-05-18 DIAGNOSIS — IMO0001 Reserved for inherently not codable concepts without codable children: Secondary | ICD-10-CM | POA: Diagnosis not present

## 2012-05-18 DIAGNOSIS — M67919 Unspecified disorder of synovium and tendon, unspecified shoulder: Secondary | ICD-10-CM | POA: Diagnosis not present

## 2012-05-22 DIAGNOSIS — E079 Disorder of thyroid, unspecified: Secondary | ICD-10-CM | POA: Diagnosis not present

## 2012-05-22 DIAGNOSIS — E119 Type 2 diabetes mellitus without complications: Secondary | ICD-10-CM | POA: Diagnosis not present

## 2012-05-22 DIAGNOSIS — D239 Other benign neoplasm of skin, unspecified: Secondary | ICD-10-CM | POA: Diagnosis not present

## 2012-05-23 DIAGNOSIS — M719 Bursopathy, unspecified: Secondary | ICD-10-CM | POA: Diagnosis not present

## 2012-05-23 DIAGNOSIS — IMO0001 Reserved for inherently not codable concepts without codable children: Secondary | ICD-10-CM | POA: Diagnosis not present

## 2012-05-25 DIAGNOSIS — M67919 Unspecified disorder of synovium and tendon, unspecified shoulder: Secondary | ICD-10-CM | POA: Diagnosis not present

## 2012-05-25 DIAGNOSIS — IMO0001 Reserved for inherently not codable concepts without codable children: Secondary | ICD-10-CM | POA: Diagnosis not present

## 2012-05-26 DIAGNOSIS — L821 Other seborrheic keratosis: Secondary | ICD-10-CM | POA: Diagnosis not present

## 2012-05-26 DIAGNOSIS — L989 Disorder of the skin and subcutaneous tissue, unspecified: Secondary | ICD-10-CM | POA: Diagnosis not present

## 2012-06-08 DIAGNOSIS — M75110 Incomplete rotator cuff tear or rupture of unspecified shoulder, not specified as traumatic: Secondary | ICD-10-CM | POA: Diagnosis not present

## 2012-06-08 DIAGNOSIS — M7500 Adhesive capsulitis of unspecified shoulder: Secondary | ICD-10-CM | POA: Diagnosis not present

## 2012-06-22 ENCOUNTER — Encounter (HOSPITAL_BASED_OUTPATIENT_CLINIC_OR_DEPARTMENT_OTHER)
Admission: RE | Admit: 2012-06-22 | Discharge: 2012-06-22 | Disposition: A | Discharge status: Home / Self Care | Payer: Medicare Other | Source: Ambulatory Visit | Attending: Orthopedic Surgery | Admitting: Orthopedic Surgery

## 2012-06-22 ENCOUNTER — Encounter (HOSPITAL_BASED_OUTPATIENT_CLINIC_OR_DEPARTMENT_OTHER): Payer: Self-pay | Admitting: *Deleted

## 2012-06-22 DIAGNOSIS — G4730 Sleep apnea, unspecified: Secondary | ICD-10-CM | POA: Diagnosis not present

## 2012-06-22 DIAGNOSIS — G609 Hereditary and idiopathic neuropathy, unspecified: Secondary | ICD-10-CM | POA: Diagnosis not present

## 2012-06-22 DIAGNOSIS — M25819 Other specified joint disorders, unspecified shoulder: Secondary | ICD-10-CM | POA: Diagnosis not present

## 2012-06-22 DIAGNOSIS — K219 Gastro-esophageal reflux disease without esophagitis: Secondary | ICD-10-CM | POA: Diagnosis not present

## 2012-06-22 DIAGNOSIS — I10 Essential (primary) hypertension: Secondary | ICD-10-CM | POA: Diagnosis not present

## 2012-06-22 DIAGNOSIS — E039 Hypothyroidism, unspecified: Secondary | ICD-10-CM | POA: Diagnosis not present

## 2012-06-22 DIAGNOSIS — M19019 Primary osteoarthritis, unspecified shoulder: Secondary | ICD-10-CM | POA: Diagnosis not present

## 2012-06-22 DIAGNOSIS — M24119 Other articular cartilage disorders, unspecified shoulder: Secondary | ICD-10-CM | POA: Diagnosis not present

## 2012-06-22 DIAGNOSIS — M66329 Spontaneous rupture of flexor tendons, unspecified upper arm: Secondary | ICD-10-CM | POA: Diagnosis not present

## 2012-06-22 DIAGNOSIS — E119 Type 2 diabetes mellitus without complications: Secondary | ICD-10-CM | POA: Diagnosis not present

## 2012-06-22 DIAGNOSIS — M67919 Unspecified disorder of synovium and tendon, unspecified shoulder: Secondary | ICD-10-CM | POA: Diagnosis not present

## 2012-06-22 DIAGNOSIS — I739 Peripheral vascular disease, unspecified: Secondary | ICD-10-CM | POA: Diagnosis not present

## 2012-06-22 DIAGNOSIS — D1600 Benign neoplasm of scapula and long bones of unspecified upper limb: Secondary | ICD-10-CM | POA: Diagnosis not present

## 2012-06-22 LAB — BASIC METABOLIC PANEL
BUN: 16 mg/dL (ref 6–23)
CO2: 25 mEq/L (ref 19–32)
Chloride: 108 mEq/L (ref 96–112)
GFR calc non Af Amer: 66 mL/min — ABNORMAL LOW (ref >90)
Glucose, Bld: 99 mg/dL (ref 70–99)
Potassium: 4.4 mEq/L (ref 3.5–5.1)
Sodium: 144 mEq/L (ref 135–145)

## 2012-06-22 NOTE — Progress Notes (Signed)
Reviewed case with dr frederick-pt diabetic, htn, sleep apnea-does use cpap-to bring all meds, cpap, overnight bag dos-may need to stay overnight-did let dr Thurston Hole know per request dr Gelene Mink.

## 2012-06-26 ENCOUNTER — Other Ambulatory Visit: Payer: Self-pay | Admitting: Physician Assistant

## 2012-06-26 ENCOUNTER — Encounter: Payer: Self-pay | Admitting: Physician Assistant

## 2012-06-26 DIAGNOSIS — J302 Other seasonal allergic rhinitis: Secondary | ICD-10-CM | POA: Insufficient documentation

## 2012-06-26 DIAGNOSIS — K219 Gastro-esophageal reflux disease without esophagitis: Secondary | ICD-10-CM | POA: Insufficient documentation

## 2012-06-26 DIAGNOSIS — I739 Peripheral vascular disease, unspecified: Secondary | ICD-10-CM | POA: Insufficient documentation

## 2012-06-26 DIAGNOSIS — G4730 Sleep apnea, unspecified: Secondary | ICD-10-CM

## 2012-06-26 DIAGNOSIS — E039 Hypothyroidism, unspecified: Secondary | ICD-10-CM | POA: Insufficient documentation

## 2012-06-26 DIAGNOSIS — M1990 Unspecified osteoarthritis, unspecified site: Secondary | ICD-10-CM | POA: Insufficient documentation

## 2012-06-26 DIAGNOSIS — T4145XA Adverse effect of unspecified anesthetic, initial encounter: Secondary | ICD-10-CM | POA: Insufficient documentation

## 2012-06-26 DIAGNOSIS — M75110 Incomplete rotator cuff tear or rupture of unspecified shoulder, not specified as traumatic: Secondary | ICD-10-CM | POA: Insufficient documentation

## 2012-06-26 DIAGNOSIS — I10 Essential (primary) hypertension: Secondary | ICD-10-CM

## 2012-06-26 DIAGNOSIS — T8859XA Other complications of anesthesia, initial encounter: Secondary | ICD-10-CM | POA: Insufficient documentation

## 2012-06-26 DIAGNOSIS — G629 Polyneuropathy, unspecified: Secondary | ICD-10-CM | POA: Insufficient documentation

## 2012-06-26 DIAGNOSIS — M7501 Adhesive capsulitis of right shoulder: Secondary | ICD-10-CM | POA: Insufficient documentation

## 2012-06-26 DIAGNOSIS — E119 Type 2 diabetes mellitus without complications: Secondary | ICD-10-CM

## 2012-06-26 NOTE — H&P (Signed)
Megan Horton is an 71 y.o. female.   Chief Complaint: right shoulder pain and stiffness HPI: Megan Horton is a 71 year-old seen for follow up evaluation from her significant right shoulder pain with adhesive capsulitis and tendonitis.  She had undergone an MRI of her right shoulder on April 29, 2012 that revealed rotator cuff tendonitis with partial tearing with impingement with superior labral tearing with AC joint DJD.  She underwent a cervical fusion by Dr. David Jones on Mar 10, 2012 which helped her neck pain, but not her shoulder pain.  We tried her in physical therapy in [place], but she made no progress and she said that her pain worsened with this.    Past Medical History  Diagnosis Date  . Hypertension   . Peripheral vascular disease   . Peripheral neuropathy   . Seasonal allergies   . Diabetes mellitus   . Hypothyroidism   . GERD (gastroesophageal reflux disease)   . Arthritis   . Sleep apnea     CPAP, sleep study 3-4 years ago in [place], [place] Dr. Majiar   . Wears glasses   . Complication of anesthesia 5/13    did have some disorientation post cerv fusion due to low sats    Past Surgical History  Procedure Date  . Abdominal hysterectomy   . Cholecystectomy 2009  . Hemorrhoid surgery 2009  . Knee arthroscopy     Left x2  . Carpal tunnel release     bilat  . Cervical fusion   . Tonsillectomy   . Varicose vein surgery     Right  . Heel spur excision     bilat  . Nose surgery   . Anterior cervical decomp/discectomy fusion 03/10/2012    Procedure: ANTERIOR CERVICAL DECOMPRESSION/DISCECTOMY FUSION 1 LEVEL;  Surgeon: David S Jones, MD;  Location: [facility];  Service: Neurosurgery;  Laterality: N/A;  Cervical five-six anterior cervical decompression fusion with plating  . Eye surgery 2012    bilat cataract    No family history on file. Social History:  reports that she quit smoking about 18 years ago. Her smoking use included Cigarettes. She has a 4 pack-year  smoking history. She has never used smokeless tobacco. She reports that she does not drink alcohol or use illicit drugs.  Allergies:  Allergies  Allergen Reactions  . Tylenol (Acetaminophen) Nausea And Vomiting   Current Outpatient Prescriptions on File Prior to Visit  Medication Sig Dispense Refill  . amLODipine (NORVASC) 10 MG tablet Take 10 mg by mouth daily.      . Cholecalciferol (VITAMIN D) 2000 UNITS CAPS Take 1 capsule by mouth daily.      . dexlansoprazole (DEXILANT) 60 MG capsule Take 60 mg by mouth daily.      . fenofibrate 160 MG tablet Take 160 mg by mouth daily.      . gabapentin (NEURONTIN) 300 MG capsule Take 300 mg by mouth daily.      . hydrochlorothiazide (MICROZIDE) 12.5 MG capsule Take 12.5 mg by mouth daily.      . Lactobacillus-Inulin (CULTURELLE) CAPS Take 1 capsule by mouth daily.      . levothyroxine (SYNTHROID, LEVOTHROID) 100 MCG tablet Take 100 mcg by mouth daily.      . Liraglutide (VICTOZA) 18 MG/3ML SOLN Inject 18 mg into the skin daily.      . lovastatin (MEVACOR) 40 MG tablet Take 40 mg by mouth at bedtime.      . meloxicam (MOBIC) 7.5 MG tablet   Take 7.5 mg by mouth daily.      . metFORMIN (GLUCOPHAGE-XR) 500 MG 24 hr tablet Take 500 mg by mouth daily with breakfast.      . Multiple Vitamins-Minerals (CENTRUM SILVER PO) Take 1 tablet by mouth daily.      . Olopatadine HCl (PATANASE NA) Place 1 spray into the nose 2 (two) times daily.      . Omega-3 Fatty Acids (FISH OIL) 1000 MG CAPS Take 1 capsule by mouth daily.      . telmisartan (MICARDIS) 80 MG tablet Take 80 mg by mouth daily.      . tetrahydrozoline (VISINE) 0.05 % ophthalmic solution Place 1 drop into both eyes 2 (two) times daily as needed. Allergy eyes         (Not in a hospital admission)  No results found for this or any previous visit (from the past 48 hour(s)). No results found.  Review of Systems  Constitutional: Negative.   HENT: Negative.   Eyes: Negative.   Respiratory:  Negative.   Cardiovascular: Negative.   Gastrointestinal: Negative.   Genitourinary: Negative.   Musculoskeletal:       Right shoulder pain  Skin: Negative.   Neurological: Negative.   Endo/Heme/Allergies: Negative.   Psychiatric/Behavioral: Negative.     There were no vitals taken for this visit. Physical Exam  Constitutional: She is oriented to person, place, and time. She appears well-developed and well-nourished.  HENT:  Head: Normocephalic and atraumatic.  Mouth/Throat: Oropharynx is clear and moist.  Eyes: Conjunctivae and EOM are normal. Pupils are equal, round, and reactive to light.  Neck: Neck supple.  Cardiovascular: Normal rate and regular rhythm.   Respiratory: Effort normal.  GI: Soft.  Genitourinary:       Not pertinent to current symptomatology therefore not examined.  Musculoskeletal:       Examination of her right shoulder reveals forward flexion of 100 with pain, abduction of 90 with pain, internal and external rotation of 50 degrees with pain and mild weakness.  No instability.  Examination of her left shoulder reveals full range of motion without pain, swelling, weakness or instability.  Vascular exam: Pulses are 2+ and symmetric.   Neurological: She is alert and oriented to person, place, and time.  Skin: Skin is warm and dry.  Psychiatric: She has a normal mood and affect. Judgment and thought content normal.     Assessment Patient Active Problem List  Diagnosis  . Hypertension  . Peripheral vascular disease  . Peripheral neuropathy  . Seasonal allergies  . Diabetes mellitus  . Hypothyroidism  . GERD (gastroesophageal reflux disease)  . Arthritis  . Sleep apnea  . Complication of anesthesia  . Adhesive capsulitis of right shoulder  . Partial tear of rotator cuff    Plan I have talked to her about this in detail.  I would recommend with her significant pain and lack of response to conservative care that we proceed with right shoulder exam under  anesthesia, manipulation, arthroscopic lysis of adhesions with debridement of her rotator cuff and labral tear with subacromial decompression and DCE, followed by intensive physical therapy.  Risks, complications and benefits of the surgery have been described to her in detail and she understands this completely.  We will plan on setting her up for this when she is ready to proceed.  She has been cleared preoperatively by Dr. Alan Dean, her family physician in [place].    Zyair Rhein J 06/26/2012, 4:20 PM    

## 2012-06-27 ENCOUNTER — Ambulatory Visit (HOSPITAL_BASED_OUTPATIENT_CLINIC_OR_DEPARTMENT_OTHER)
Admission: RE | Admit: 2012-06-27 | Discharge: 2012-06-28 | Disposition: A | Discharge status: Home / Self Care | Payer: Medicare Other | Source: Ambulatory Visit | Attending: Orthopedic Surgery | Admitting: Orthopedic Surgery

## 2012-06-27 ENCOUNTER — Encounter (HOSPITAL_BASED_OUTPATIENT_CLINIC_OR_DEPARTMENT_OTHER): Payer: Self-pay | Admitting: *Deleted

## 2012-06-27 ENCOUNTER — Encounter (HOSPITAL_BASED_OUTPATIENT_CLINIC_OR_DEPARTMENT_OTHER): Payer: Self-pay | Admitting: Certified Registered Nurse Anesthetist

## 2012-06-27 ENCOUNTER — Encounter (HOSPITAL_BASED_OUTPATIENT_CLINIC_OR_DEPARTMENT_OTHER)
Admission: RE | Disposition: A | Discharge status: Home / Self Care | Payer: Self-pay | Source: Ambulatory Visit | Attending: Orthopedic Surgery

## 2012-06-27 ENCOUNTER — Ambulatory Visit (HOSPITAL_BASED_OUTPATIENT_CLINIC_OR_DEPARTMENT_OTHER): Payer: Medicare Other | Admitting: Certified Registered Nurse Anesthetist

## 2012-06-27 DIAGNOSIS — M24119 Other articular cartilage disorders, unspecified shoulder: Secondary | ICD-10-CM | POA: Diagnosis not present

## 2012-06-27 DIAGNOSIS — E039 Hypothyroidism, unspecified: Secondary | ICD-10-CM | POA: Diagnosis present

## 2012-06-27 DIAGNOSIS — M75110 Incomplete rotator cuff tear or rupture of unspecified shoulder, not specified as traumatic: Secondary | ICD-10-CM | POA: Diagnosis present

## 2012-06-27 DIAGNOSIS — M19019 Primary osteoarthritis, unspecified shoulder: Secondary | ICD-10-CM | POA: Insufficient documentation

## 2012-06-27 DIAGNOSIS — I739 Peripheral vascular disease, unspecified: Secondary | ICD-10-CM | POA: Diagnosis present

## 2012-06-27 DIAGNOSIS — G629 Polyneuropathy, unspecified: Secondary | ICD-10-CM | POA: Diagnosis present

## 2012-06-27 DIAGNOSIS — M719 Bursopathy, unspecified: Secondary | ICD-10-CM | POA: Insufficient documentation

## 2012-06-27 DIAGNOSIS — G8918 Other acute postprocedural pain: Secondary | ICD-10-CM | POA: Diagnosis not present

## 2012-06-27 DIAGNOSIS — M7501 Adhesive capsulitis of right shoulder: Secondary | ICD-10-CM | POA: Diagnosis present

## 2012-06-27 DIAGNOSIS — E119 Type 2 diabetes mellitus without complications: Secondary | ICD-10-CM | POA: Diagnosis present

## 2012-06-27 DIAGNOSIS — D1600 Benign neoplasm of scapula and long bones of unspecified upper limb: Secondary | ICD-10-CM | POA: Diagnosis not present

## 2012-06-27 DIAGNOSIS — I10 Essential (primary) hypertension: Secondary | ICD-10-CM | POA: Diagnosis present

## 2012-06-27 DIAGNOSIS — M67919 Unspecified disorder of synovium and tendon, unspecified shoulder: Secondary | ICD-10-CM | POA: Insufficient documentation

## 2012-06-27 DIAGNOSIS — M25519 Pain in unspecified shoulder: Secondary | ICD-10-CM | POA: Diagnosis not present

## 2012-06-27 DIAGNOSIS — G4730 Sleep apnea, unspecified: Secondary | ICD-10-CM | POA: Diagnosis present

## 2012-06-27 DIAGNOSIS — M66329 Spontaneous rupture of flexor tendons, unspecified upper arm: Secondary | ICD-10-CM | POA: Insufficient documentation

## 2012-06-27 DIAGNOSIS — M25819 Other specified joint disorders, unspecified shoulder: Secondary | ICD-10-CM | POA: Insufficient documentation

## 2012-06-27 DIAGNOSIS — G609 Hereditary and idiopathic neuropathy, unspecified: Secondary | ICD-10-CM | POA: Insufficient documentation

## 2012-06-27 DIAGNOSIS — K219 Gastro-esophageal reflux disease without esophagitis: Secondary | ICD-10-CM | POA: Diagnosis present

## 2012-06-27 DIAGNOSIS — M75120 Complete rotator cuff tear or rupture of unspecified shoulder, not specified as traumatic: Secondary | ICD-10-CM | POA: Diagnosis not present

## 2012-06-27 HISTORY — DX: Presence of spectacles and contact lenses: Z97.3

## 2012-06-27 HISTORY — PX: SHOULDER ARTHROSCOPY: SHX128

## 2012-06-27 HISTORY — DX: Adverse effect of unspecified anesthetic, initial encounter: T41.45XA

## 2012-06-27 LAB — GLUCOSE, CAPILLARY: Glucose-Capillary: 172 mg/dL — ABNORMAL HIGH (ref 70–99)

## 2012-06-27 LAB — POCT HEMOGLOBIN-HEMACUE: Hemoglobin: 13.4 g/dL (ref 12.0–15.0)

## 2012-06-27 SURGERY — ARTHROSCOPY, SHOULDER
Anesthesia: General | Site: Shoulder | Laterality: Right | Wound class: Clean

## 2012-06-27 MED ORDER — SIMVASTATIN 20 MG PO TABS: 20.0000 mg | ORAL_TABLET | Freq: Every day | ORAL | Status: DC

## 2012-06-27 MED ORDER — LIRAGLUTIDE 18 MG/3ML ~~LOC~~ SOLN: 18.0000 mg | Freq: Every day | SUBCUTANEOUS | Status: DC

## 2012-06-27 MED ORDER — METFORMIN HCL ER 500 MG PO TB24: 500.0000 mg | ORAL_TABLET | Freq: Every day | ORAL | Status: DC

## 2012-06-27 MED ORDER — DEXAMETHASONE SODIUM PHOSPHATE 4 MG/ML IJ SOLN
INTRAMUSCULAR | Status: DC | PRN
  Administered 2012-06-27: 4 mg via INTRAVENOUS

## 2012-06-27 MED ORDER — LACTATED RINGERS IV SOLN
INTRAVENOUS | Status: DC
  Administered 2012-06-27 (×2): via INTRAVENOUS

## 2012-06-27 MED ORDER — VITAMIN D 50 MCG (2000 UT) PO CAPS: 1.0000 | ORAL_CAPSULE | Freq: Every day | ORAL | Status: DC

## 2012-06-27 MED ORDER — OLOPATADINE HCL 0.6 % NA SOLN
2.0000 | Freq: Two times a day (BID) | NASAL | Status: DC
  Administered 2012-06-27: 2 via NASAL

## 2012-06-27 MED ORDER — OXYCODONE HCL 5 MG PO TABS: 5.0000 mg | ORAL_TABLET | Freq: Once | ORAL | Status: AC | PRN

## 2012-06-27 MED ORDER — FENTANYL CITRATE 0.05 MG/ML IJ SOLN
50.0000 ug | INTRAMUSCULAR | Status: DC | PRN
  Administered 2012-06-27: 50 ug via INTRAVENOUS

## 2012-06-27 MED ORDER — IRBESARTAN 300 MG PO TABS: 300.0000 mg | ORAL_TABLET | Freq: Every day | ORAL | Status: DC

## 2012-06-27 MED ORDER — GABAPENTIN 300 MG PO CAPS: 300.0000 mg | ORAL_CAPSULE | Freq: Every day | ORAL | Status: DC

## 2012-06-27 MED ORDER — LACTATED RINGERS IV SOLN
INTRAVENOUS | Status: DC
  Administered 2012-06-27: 11:00:00 via INTRAVENOUS

## 2012-06-27 MED ORDER — OXYCODONE HCL 5 MG PO TABS: 5.0000 mg | ORAL_TABLET | ORAL | Status: DC | PRN

## 2012-06-27 MED ORDER — HYDROMORPHONE HCL PF 1 MG/ML IJ SOLN
0.5000 mg | INTRAMUSCULAR | Status: DC | PRN
Start: 2012-06-27 — End: 2012-06-28

## 2012-06-27 MED ORDER — METOCLOPRAMIDE HCL 5 MG PO TABS: 5.0000 mg | ORAL_TABLET | Freq: Three times a day (TID) | ORAL | Status: DC | PRN

## 2012-06-27 MED ORDER — AMLODIPINE BESYLATE 10 MG PO TABS: 10.0000 mg | ORAL_TABLET | Freq: Every day | ORAL | Status: DC

## 2012-06-27 MED ORDER — LIDOCAINE HCL (CARDIAC) 20 MG/ML IV SOLN
INTRAVENOUS | Status: DC | PRN
  Administered 2012-06-27: 30 mg via INTRAVENOUS

## 2012-06-27 MED ORDER — LEVOTHYROXINE SODIUM 100 MCG PO TABS
100.0000 ug | ORAL_TABLET | Freq: Every day | ORAL | Status: DC
  Administered 2012-06-27: 100 ug via ORAL

## 2012-06-27 MED ORDER — HYDROCHLOROTHIAZIDE 12.5 MG PO CAPS: 12.5000 mg | ORAL_CAPSULE | Freq: Every day | ORAL | Status: DC

## 2012-06-27 MED ORDER — CEFAZOLIN SODIUM-DEXTROSE 2-3 GM-% IV SOLR
2.0000 g | Freq: Four times a day (QID) | INTRAVENOUS | Status: AC
Start: 2012-06-28 — End: 2012-06-28
  Administered 2012-06-28 (×2): 2 g via INTRAVENOUS

## 2012-06-27 MED ORDER — CENTRUM SILVER PO TABS: 1.0000 | ORAL_TABLET | Freq: Every day | ORAL | Status: DC

## 2012-06-27 MED ORDER — OXYCODONE HCL 5 MG/5ML PO SOLN: 5.0000 mg | Freq: Once | ORAL | Status: AC | PRN

## 2012-06-27 MED ORDER — ONDANSETRON HCL 4 MG PO TABS: 4.0000 mg | ORAL_TABLET | Freq: Four times a day (QID) | ORAL | Status: DC | PRN

## 2012-06-27 MED ORDER — ONDANSETRON HCL 4 MG/2ML IJ SOLN: 4.0000 mg | Freq: Four times a day (QID) | INTRAMUSCULAR | Status: DC | PRN

## 2012-06-27 MED ORDER — METOCLOPRAMIDE HCL 5 MG/ML IJ SOLN: 5.0000 mg | Freq: Three times a day (TID) | INTRAMUSCULAR | Status: DC | PRN

## 2012-06-27 MED ORDER — SUCCINYLCHOLINE CHLORIDE 20 MG/ML IJ SOLN
INTRAMUSCULAR | Status: DC | PRN
  Administered 2012-06-27: 100 mg via INTRAVENOUS

## 2012-06-27 MED ORDER — BUPIVACAINE HCL (PF) 0.25 % IJ SOLN
INTRAMUSCULAR | Status: DC | PRN
  Administered 2012-06-27: 15 mL

## 2012-06-27 MED ORDER — INSULIN ASPART 100 UNIT/ML ~~LOC~~ SOLN
0.0000 [IU] | Freq: Every day | SUBCUTANEOUS | Status: DC
  Administered 2012-06-27: 2 [IU] via SUBCUTANEOUS

## 2012-06-27 MED ORDER — INSULIN ASPART 100 UNIT/ML ~~LOC~~ SOLN
0.0000 [IU] | Freq: Three times a day (TID) | SUBCUTANEOUS | Status: DC
  Administered 2012-06-27: 3 [IU] via SUBCUTANEOUS

## 2012-06-27 MED ORDER — SODIUM CHLORIDE 0.9 % IV SOLN
INTRAVENOUS | Status: DC
  Administered 2012-06-27: 15:00:00 via INTRAVENOUS

## 2012-06-27 MED ORDER — ONDANSETRON HCL 4 MG/2ML IJ SOLN
INTRAMUSCULAR | Status: DC | PRN
  Administered 2012-06-27: 4 mg via INTRAVENOUS

## 2012-06-27 MED ORDER — DOCUSATE SODIUM 100 MG PO CAPS
100.0000 mg | ORAL_CAPSULE | Freq: Two times a day (BID) | ORAL | Status: DC
  Administered 2012-06-27 (×2): 100 mg via ORAL

## 2012-06-27 MED ORDER — PROPOFOL 10 MG/ML IV EMUL
INTRAVENOUS | Status: DC | PRN
  Administered 2012-06-27: 20 mg via INTRAVENOUS
  Administered 2012-06-27: 200 mg via INTRAVENOUS

## 2012-06-27 MED ORDER — METOCLOPRAMIDE HCL 5 MG/ML IJ SOLN: 10.0000 mg | Freq: Once | INTRAMUSCULAR | Status: AC | PRN

## 2012-06-27 MED ORDER — OXYCODONE HCL 5 MG PO TABS: ORAL_TABLET | ORAL | Status: DC

## 2012-06-27 MED ORDER — CHLORHEXIDINE GLUCONATE 4 % EX LIQD: 60.0000 mL | Freq: Once | CUTANEOUS | Status: DC

## 2012-06-27 MED ORDER — CEFAZOLIN SODIUM-DEXTROSE 2-3 GM-% IV SOLR
2.0000 g | Freq: Four times a day (QID) | INTRAVENOUS | Status: DC
  Administered 2012-06-27: 2 g via INTRAVENOUS

## 2012-06-27 MED ORDER — CEFAZOLIN SODIUM-DEXTROSE 2-3 GM-% IV SOLR
2.0000 g | INTRAVENOUS | Status: AC
  Administered 2012-06-27: 3 g via INTRAVENOUS

## 2012-06-27 MED ORDER — BISACODYL 5 MG PO TBEC: 10.0000 mg | DELAYED_RELEASE_TABLET | Freq: Every day | ORAL | Status: DC

## 2012-06-27 MED ORDER — MIDAZOLAM HCL 2 MG/2ML IJ SOLN
1.0000 mg | INTRAMUSCULAR | Status: DC | PRN
  Administered 2012-06-27: 2 mg via INTRAVENOUS

## 2012-06-27 MED ORDER — HYDROMORPHONE HCL PF 1 MG/ML IJ SOLN: 0.2500 mg | INTRAMUSCULAR | Status: DC | PRN

## 2012-06-27 MED ORDER — NAPHAZOLINE HCL 0.1 % OP SOLN: 1.0000 [drp] | Freq: Four times a day (QID) | OPHTHALMIC | Status: DC | PRN

## 2012-06-27 MED ORDER — PANTOPRAZOLE SODIUM 40 MG PO TBEC: 40.0000 mg | DELAYED_RELEASE_TABLET | Freq: Every day | ORAL | Status: DC

## 2012-06-27 MED ORDER — FENOFIBRATE 160 MG PO TABS
160.0000 mg | ORAL_TABLET | Freq: Every day | ORAL | Status: DC
  Administered 2012-06-27: 160 mg via ORAL

## 2012-06-27 SURGICAL SUPPLY — 72 items
BENZOIN TINCTURE PRP APPL 2/3 (GAUZE/BANDAGES/DRESSINGS) IMPLANT
BLADE CUDA 5.5 (BLADE) IMPLANT
BLADE CUTTER GATOR 3.5 (BLADE) ×2 IMPLANT
BLADE GREAT WHITE 4.2 (BLADE) IMPLANT
BLADE SURG 15 STRL LF DISP TIS (BLADE) IMPLANT
BLADE SURG 15 STRL SS (BLADE)
BLADE SURG ROTATE 9660 (MISCELLANEOUS) IMPLANT
BNDG COHESIVE 4X5 TAN STRL (GAUZE/BANDAGES/DRESSINGS) ×2 IMPLANT
BUR OVAL 6.0 (BURR) ×2 IMPLANT
CANISTER OMNI JUG 16 LITER (MISCELLANEOUS) ×2 IMPLANT
CANISTER SUCTION 2500CC (MISCELLANEOUS) IMPLANT
CANNULA TWIST IN 8.25X7CM (CANNULA) IMPLANT
DECANTER SPIKE VIAL GLASS SM (MISCELLANEOUS) IMPLANT
DRAPE SHOULDER BEACH CHAIR (DRAPES) ×2 IMPLANT
DRAPE U-SHAPE 47X51 STRL (DRAPES) ×4 IMPLANT
DRSG PAD ABDOMINAL 8X10 ST (GAUZE/BANDAGES/DRESSINGS) ×2 IMPLANT
DURAPREP 26ML APPLICATOR (WOUND CARE) ×2 IMPLANT
ELECT REM PT RETURN 9FT ADLT (ELECTROSURGICAL) ×2
ELECTRODE REM PT RTRN 9FT ADLT (ELECTROSURGICAL) ×1 IMPLANT
GAUZE XEROFORM 1X8 LF (GAUZE/BANDAGES/DRESSINGS) ×2 IMPLANT
GLOVE BIO SURGEON STRL SZ 6.5 (GLOVE) ×2 IMPLANT
GLOVE BIO SURGEON STRL SZ7 (GLOVE) ×2 IMPLANT
GLOVE BIOGEL PI IND STRL 7.0 (GLOVE) ×2 IMPLANT
GLOVE BIOGEL PI IND STRL 7.5 (GLOVE) ×1 IMPLANT
GLOVE BIOGEL PI INDICATOR 7.0 (GLOVE) ×2
GLOVE BIOGEL PI INDICATOR 7.5 (GLOVE) ×1
GLOVE SS BIOGEL STRL SZ 7.5 (GLOVE) ×1 IMPLANT
GLOVE SUPERSENSE BIOGEL SZ 7.5 (GLOVE) ×1
GOWN PREVENTION PLUS XLARGE (GOWN DISPOSABLE) ×2 IMPLANT
NDL SAFETY ECLIPSE 18X1.5 (NEEDLE) IMPLANT
NDL SUT 6 .5 CRC .975X.05 MAYO (NEEDLE) IMPLANT
NEEDLE 1/2 CIR CATGUT .05X1.09 (NEEDLE) IMPLANT
NEEDLE HYPO 18GX1.5 SHARP (NEEDLE)
NEEDLE MAYO TAPER (NEEDLE)
NEEDLE SCORPION MULTI FIRE (NEEDLE) IMPLANT
PACK ARTHROSCOPY DSU (CUSTOM PROCEDURE TRAY) ×2 IMPLANT
PACK BASIN DAY SURGERY FS (CUSTOM PROCEDURE TRAY) ×2 IMPLANT
PAD ALCOHOL SWAB (MISCELLANEOUS) ×4 IMPLANT
PENCIL BUTTON HOLSTER BLD 10FT (ELECTRODE) IMPLANT
SET ARTHROSCOPY TUBING (MISCELLANEOUS) ×1
SET ARTHROSCOPY TUBING LN (MISCELLANEOUS) ×1 IMPLANT
SHEET MEDIUM DRAPE 40X70 STRL (DRAPES) ×2 IMPLANT
SLEEVE SCD COMPRESS KNEE MED (MISCELLANEOUS) ×2 IMPLANT
SLING ARM FOAM STRAP LRG (SOFTGOODS) IMPLANT
SLING ARM FOAM STRAP MED (SOFTGOODS) IMPLANT
SLING ARM FOAM STRAP XLG (SOFTGOODS) IMPLANT
SLING ARM IMMOBILIZER MED (SOFTGOODS) IMPLANT
SLING ULTRA III MED (ORTHOPEDIC SUPPLIES) IMPLANT
SPONGE GAUZE 4X4 12PLY (GAUZE/BANDAGES/DRESSINGS) ×2 IMPLANT
SPONGE LAP 4X18 X RAY DECT (DISPOSABLE) IMPLANT
STRIP CLOSURE SKIN 1/2X4 (GAUZE/BANDAGES/DRESSINGS) IMPLANT
SUCTION FRAZIER TIP 10 FR DISP (SUCTIONS) IMPLANT
SUT ETHILON 3 0 PS 1 (SUTURE) ×2 IMPLANT
SUT FIBERWIRE #2 38 T-5 BLUE (SUTURE)
SUT PDS AB 2-0 CT2 27 (SUTURE) IMPLANT
SUT PROLENE 3 0 PS 2 (SUTURE) IMPLANT
SUT TIGER TAPE 7 IN WHITE (SUTURE) IMPLANT
SUT VIC AB 0 SH 27 (SUTURE) IMPLANT
SUT VIC AB 2-0 PS2 27 (SUTURE) IMPLANT
SUT VIC AB 2-0 SH 27 (SUTURE)
SUT VIC AB 2-0 SH 27XBRD (SUTURE) IMPLANT
SUTURE FIBERWR #2 38 T-5 BLUE (SUTURE) IMPLANT
SYR 20CC LL (SYRINGE) IMPLANT
SYR 5ML LL (SYRINGE) IMPLANT
SYR BULB 3OZ (MISCELLANEOUS) IMPLANT
TAPE FIBER 2MM 7IN #2 BLUE (SUTURE) IMPLANT
TAPE HYPAFIX 6X30 (GAUZE/BANDAGES/DRESSINGS) IMPLANT
TAPE STRIPS DRAPE STRL (GAUZE/BANDAGES/DRESSINGS) ×2 IMPLANT
TOWEL OR 17X24 6PK STRL BLUE (TOWEL DISPOSABLE) ×2 IMPLANT
TUBE CONNECTING 20X1/4 (TUBING) ×2 IMPLANT
WAND STAR VAC 90 (SURGICAL WAND) ×2 IMPLANT
WATER STERILE IRR 1000ML POUR (IV SOLUTION) ×2 IMPLANT

## 2012-06-27 NOTE — Progress Notes (Signed)
Assisted Dr. Frederick with right, ultrasound guided, interscalene  block. Side rails up, monitors on throughout procedure. See vital signs in flow sheet. Tolerated Procedure well. 

## 2012-06-27 NOTE — Transfer of Care (Signed)
Immediate Anesthesia Transfer of Care Note  Patient: Megan Horton  Procedure(s) Performed: Procedure(s) (LRB): ARTHROSCOPY SHOULDER (Right)  Patient Location: PACU  Anesthesia Type: General and Regional  Level of Consciousness: awake, alert , oriented and patient cooperative  Airway & Oxygen Therapy: Patient Spontanous Breathing and Patient connected to face mask oxygen  Post-op Assessment: Report given to PACU RN and Post -op Vital signs reviewed and stable  Post vital signs: Reviewed and stable  Complications: No apparent anesthesia complications

## 2012-06-27 NOTE — Anesthesia Preprocedure Evaluation (Signed)
Anesthesia Evaluation  Patient identified by MRN, date of birth, ID band Patient awake    Reviewed: Allergy & Precautions, H&P , NPO status , Patient's Chart, lab work & pertinent test results, reviewed documented beta blocker date and time   History of Anesthesia Complications (+) Emergence Delirium  Airway Mallampati: II TM Distance: >3 FB Neck ROM: full    Dental   Pulmonary sleep apnea and Continuous Positive Airway Pressure Ventilation ,  breath sounds clear to auscultation        Cardiovascular hypertension, Pt. on medications negative cardio ROS  Rhythm:regular     Neuro/Psych  Neuromuscular disease negative psych ROS   GI/Hepatic Neg liver ROS, GERD-  Medicated and Controlled,  Endo/Other  Oral Hypoglycemic AgentsHypothyroidism Morbid obesity  Renal/GU negative Renal ROS  negative genitourinary   Musculoskeletal   Abdominal   Peds  Hematology negative hematology ROS (+)   Anesthesia Other Findings See surgeon's H&P   Reproductive/Obstetrics negative OB ROS                           Anesthesia Physical Anesthesia Plan  ASA: III  Anesthesia Plan: General   Post-op Pain Management:    Induction: Intravenous  Airway Management Planned: Oral ETT  Additional Equipment:   Intra-op Plan:   Post-operative Plan: Extubation in OR  Informed Consent: I have reviewed the patients History and Physical, chart, labs and discussed the procedure including the risks, benefits and alternatives for the proposed anesthesia with the patient or authorized representative who has indicated his/her understanding and acceptance.   Dental Advisory Given  Plan Discussed with: CRNA and Surgeon  Anesthesia Plan Comments:         Anesthesia Quick Evaluation

## 2012-06-27 NOTE — H&P (View-Only) (Signed)
Megan Horton is an 71 y.o. female.   Chief Complaint: right shoulder pain and stiffness HPI: Megan Horton is a 71 year-old seen for follow up evaluation from her significant right shoulder pain with adhesive capsulitis and tendonitis.  She had undergone an MRI of her right shoulder on April 29, 2012 that revealed rotator cuff tendonitis with partial tearing with impingement with superior labral tearing with AC joint DJD.  She underwent a cervical fusion by Dr. Marikay Alar on Mar 10, 2012 which helped her neck pain, but not her shoulder pain.  We tried her in physical therapy in [place], but she made no progress and she said that her pain worsened with this.    Past Medical History  Diagnosis Date  . Hypertension   . Peripheral vascular disease   . Peripheral neuropathy   . Seasonal allergies   . Diabetes mellitus   . Hypothyroidism   . GERD (gastroesophageal reflux disease)   . Arthritis   . Sleep apnea     CPAP, sleep study 3-4 years ago in [place], [place] Dr. Horace Porteous   . Wears glasses   . Complication of anesthesia 5/13    did have some disorientation post cerv fusion due to low sats    Past Surgical History  Procedure Date  . Abdominal hysterectomy   . Cholecystectomy 2009  . Hemorrhoid surgery 2009  . Knee arthroscopy     Left x2  . Carpal tunnel release     bilat  . Cervical fusion   . Tonsillectomy   . Varicose vein surgery     Right  . Heel spur excision     bilat  . Nose surgery   . Anterior cervical decomp/discectomy fusion 03/10/2012    Procedure: ANTERIOR CERVICAL DECOMPRESSION/DISCECTOMY FUSION 1 LEVEL;  Surgeon: Tia Alert, MD;  Location: [facility];  Service: Neurosurgery;  Laterality: N/A;  Cervical five-six anterior cervical decompression fusion with plating  . Eye surgery 2012    bilat cataract    No family history on file. Social History:  reports that she quit smoking about 18 years ago. Her smoking use included Cigarettes. She has a 4 pack-year  smoking history. She has never used smokeless tobacco. She reports that she does not drink alcohol or use illicit drugs.  Allergies:  Allergies  Allergen Reactions  . Tylenol (Acetaminophen) Nausea And Vomiting   Current Outpatient Prescriptions on File Prior to Visit  Medication Sig Dispense Refill  . amLODipine (NORVASC) 10 MG tablet Take 10 mg by mouth daily.      . Cholecalciferol (VITAMIN D) 2000 UNITS CAPS Take 1 capsule by mouth daily.      Marland Kitchen dexlansoprazole (DEXILANT) 60 MG capsule Take 60 mg by mouth daily.      . fenofibrate 160 MG tablet Take 160 mg by mouth daily.      Marland Kitchen gabapentin (NEURONTIN) 300 MG capsule Take 300 mg by mouth daily.      . hydrochlorothiazide (MICROZIDE) 12.5 MG capsule Take 12.5 mg by mouth daily.      . Lactobacillus-Inulin (CULTURELLE) CAPS Take 1 capsule by mouth daily.      Marland Kitchen levothyroxine (SYNTHROID, LEVOTHROID) 100 MCG tablet Take 100 mcg by mouth daily.      . Liraglutide (VICTOZA) 18 MG/3ML SOLN Inject 18 mg into the skin daily.      Marland Kitchen lovastatin (MEVACOR) 40 MG tablet Take 40 mg by mouth at bedtime.      . meloxicam (MOBIC) 7.5 MG tablet  Take 7.5 mg by mouth daily.      . metFORMIN (GLUCOPHAGE-XR) 500 MG 24 hr tablet Take 500 mg by mouth daily with breakfast.      . Multiple Vitamins-Minerals (CENTRUM SILVER PO) Take 1 tablet by mouth daily.      . Olopatadine HCl (PATANASE NA) Place 1 spray into the nose 2 (two) times daily.      . Omega-3 Fatty Acids (FISH OIL) 1000 MG CAPS Take 1 capsule by mouth daily.      Marland Kitchen telmisartan (MICARDIS) 80 MG tablet Take 80 mg by mouth daily.      Marland Kitchen tetrahydrozoline (VISINE) 0.05 % ophthalmic solution Place 1 drop into both eyes 2 (two) times daily as needed. Allergy eyes         (Not in a hospital admission)  No results found for this or any previous visit (from the past 48 hour(s)). No results found.  Review of Systems  Constitutional: Negative.   HENT: Negative.   Eyes: Negative.   Respiratory:  Negative.   Cardiovascular: Negative.   Gastrointestinal: Negative.   Genitourinary: Negative.   Musculoskeletal:       Right shoulder pain  Skin: Negative.   Neurological: Negative.   Endo/Heme/Allergies: Negative.   Psychiatric/Behavioral: Negative.     There were no vitals taken for this visit. Physical Exam  Constitutional: She is oriented to person, place, and time. She appears well-developed and well-nourished.  HENT:  Head: Normocephalic and atraumatic.  Mouth/Throat: Oropharynx is clear and moist.  Eyes: Conjunctivae and EOM are normal. Pupils are equal, round, and reactive to light.  Neck: Neck supple.  Cardiovascular: Normal rate and regular rhythm.   Respiratory: Effort normal.  GI: Soft.  Genitourinary:       Not pertinent to current symptomatology therefore not examined.  Musculoskeletal:       Examination of her right shoulder reveals forward flexion of 100 with pain, abduction of 90 with pain, internal and external rotation of 50 degrees with pain and mild weakness.  No instability.  Examination of her left shoulder reveals full range of motion without pain, swelling, weakness or instability.  Vascular exam: Pulses are 2+ and symmetric.   Neurological: She is alert and oriented to person, place, and time.  Skin: Skin is warm and dry.  Psychiatric: She has a normal mood and affect. Judgment and thought content normal.     Assessment Patient Active Problem List  Diagnosis  . Hypertension  . Peripheral vascular disease  . Peripheral neuropathy  . Seasonal allergies  . Diabetes mellitus  . Hypothyroidism  . GERD (gastroesophageal reflux disease)  . Arthritis  . Sleep apnea  . Complication of anesthesia  . Adhesive capsulitis of right shoulder  . Partial tear of rotator cuff    Plan I have talked to her about this in detail.  I would recommend with her significant pain and lack of response to conservative care that we proceed with right shoulder exam under  anesthesia, manipulation, arthroscopic lysis of adhesions with debridement of her rotator cuff and labral tear with subacromial decompression and DCE, followed by intensive physical therapy.  Risks, complications and benefits of the surgery have been described to her in detail and she understands this completely.  We will plan on setting her up for this when she is ready to proceed.  She has been cleared preoperatively by Dr. Dustin Flock, her family physician in [place].    Megan Horton J 06/26/2012, 4:20 PM

## 2012-06-27 NOTE — Anesthesia Procedure Notes (Addendum)
Anesthesia Regional Block:  Interscalene brachial plexus block  Pre-Anesthetic Checklist: ,, timeout performed, Correct Patient, Correct Site, Correct Laterality, Correct Procedure, Correct Position, site marked, Risks and benefits discussed,  Surgical consent,  Pre-op evaluation,  At surgeon's request and post-op pain management  Laterality: Right  Prep: chloraprep       Needles:   Needle Type: Other   (Arrow Echogenic)   Needle Length: 9cm  Needle Gauge: 21    Additional Needles:  Procedures: ultrasound guided Interscalene brachial plexus block Narrative:  Start time: 06/27/2012 11:33 AM End time: 06/27/2012 11:40 AM Injection made incrementally with aspirations every 5 mL.  Performed by: Personally  Anesthesiologist: Aldona Lento, MD  Additional Notes: Ultrasound guidance used to: id relevant anatomy, confirm needle position, local anesthetic spread, avoidance of vascular puncture. Picture saved. No complications. Block performed personally by Janetta Hora. Gelene Mink, MD    Interscalene brachial plexus block Procedure Name: Intubation Date/Time: 06/27/2012 12:21 PM Performed by: Leslie Jester D Pre-anesthesia Checklist: Patient identified, Emergency Drugs available, Suction available and Patient being monitored Patient Re-evaluated:Patient Re-evaluated prior to inductionOxygen Delivery Method: Circle System Utilized Preoxygenation: Pre-oxygenation with 100% oxygen Intubation Type: IV induction Ventilation: Mask ventilation without difficulty Tube type: Glide rite Number of attempts: 1 Airway Equipment and Method: stylet and LTA kit utilized Placement Confirmation: ETT inserted through vocal cords under direct vision,  positive ETCO2 and breath sounds checked- equal and bilateral Secured at: 21 cm Tube secured with: Tape Dental Injury: Teeth and Oropharynx as per pre-operative assessment

## 2012-06-27 NOTE — Interval H&P Note (Signed)
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 After consideration of risks, benefits and other options for treatment, the patient has consented to  Procedure(s) (LRB): ARTHROSCOPY SHOULDER (Right) as a surgical intervention .  The patient's history has been reviewed, patient examined, no change in status, stable for surgery.  I have reviewed the patient's chart and labs.  Questions were answered to the patient's satisfaction.     Salvatore Marvel A

## 2012-06-27 NOTE — Brief Op Note (Signed)
06/27/2012  1:08 PM  PATIENT:  [name]  71 y.o. 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  POST-OPERATIVE DIAGNOSIS:  Right shoulder partial RTC, labrum tears with impingement and AC jt DJD  PROCEDURE:  Procedure(s) (LRB): ARTHROSCOPY SHOULDER (Right) with debridement of partial RTC, labrum tears with SAD, DCE  SURGEON:  Surgeon(s) and Role:    * Nilda Simmer, MD - Primary  PHYSICIAN ASSISTANT: Julien Girt PA-C  ASSISTANTS: Kirstin Shepperson PA-C   ANESTHESIA:   general  EBL:  Total I/O In: 1000 [I.V.:1000] Out: -   BLOOD ADMINISTERED:none  DRAINS: none   LOCAL MEDICATIONS USED:  NONE  SPECIMEN:  No Specimen  DISPOSITION OF SPECIMEN:  N/A  COUNTS:  YES  TOURNIQUET:  * No tourniquets in log *  DICTATION: .Note written in EPIC  PLAN OF CARE: Admit for overnight observation  PATIENT DISPOSITION:  PACU - hemodynamically stable.   Delay start of Pharmacological VTE agent (>24hrs) due to surgical blood loss or risk of bleeding: not applicable

## 2012-06-27 NOTE — Anesthesia Postprocedure Evaluation (Signed)
Anesthesia Post Note  Patient: Megan Horton  Procedure(s) Performed: Procedure(s) (LRB): ARTHROSCOPY SHOULDER (Right)  Anesthesia type: General  Patient location: PACU  Post pain: Pain level controlled  Post assessment: Patient's Cardiovascular Status Stable  Last Vitals:  Filed Vitals:   06/27/12 1500  BP: 145/73  Pulse: 84  Temp: 36.7 C  Resp: 18    Post vital signs: Reviewed and stable  Level of consciousness: alert  Complications: No apparent anesthesia complications

## 2012-06-28 ENCOUNTER — Encounter (HOSPITAL_BASED_OUTPATIENT_CLINIC_OR_DEPARTMENT_OTHER): Payer: Self-pay | Admitting: Orthopedic Surgery

## 2012-06-28 NOTE — Op Note (Signed)
NAMERASHEEMA, TRULUCK NO.:  [id number]  MEDICAL RECORD NO.:  [id number]  LOCATION:                                 FACILITY:  PHYSICIAN:  Kailin Leu A. Thurston Hole, M.D. DATE OF BIRTH:  10-Apr-1941  DATE OF PROCEDURE:  06/27/2012 DATE OF DISCHARGE:                              OPERATIVE REPORT   PREOPERATIVE DIAGNOSES: 1. Right shoulder partial rotator cuff tear with partial labrum tear     with partial biceps tendon tear. 2. Right shoulder impingement. 3. Right shoulder acromioclavicular joint degenerative joint disease     and spurring.  POSTOPERATIVE DIAGNOSES: 1. Right shoulder partial rotator cuff tear with partial labrum tear     with partial biceps tendon tear. 2. Right shoulder impingement. 3. Right shoulder acromioclavicular joint degenerative joint disease     and spurring.  PROCEDURE: 1. Right shoulder EUA followed by arthroscopic debridement of partial     rotator cuff tear, partial labrum tear, partial biceps tendon tear. 2. Right shoulder subacromial decompression. 3. Right shoulder distal clavicle excision.  SURGEON:  Elana Alm. Thurston Hole, M.D.  ASSISTANT:  Kirstin Shepperson, PA-C.  ANESTHESIA:  General.  OPERATIVE TIME:  45 minutes.  COMPLICATIONS:  None.  INDICATION FOR PROCEDURE:  Ms. Neuwirth is a 71 year old woman who has had significant right shoulder pain for the past 5-6 months, increasing in nature, with exam and MRI documenting a rotator cuff tendonitis versus partial tear with partial labrum tear with impingement and AC joint arthropathy.  She has failed conservative care and is now to undergo arthroscopy.  DESCRIPTION:  Ms. Selvy was brought to the operating room on June 27, 2012, after an interscalene block was placed in the Holding by Anesthesia.  She was placed on operative table in supine position.  She received Ancef 2 g IV preoperatively for prophylaxis.  After being placed under general anesthesia, her right shoulder was  examined.  She had full range of motion.  Shoulder was stable on ligamentous exam.  She was then placed in beach-chair position.  Her shoulder and arm was prepped using sterile DuraPrep and draped using sterile technique.  Time- out of procedure was called and correct right shoulder was identified. Initially, through a posterior arthroscopic portal, the arthroscope with a pump attached was placed into an anterior portal and an arthroscopic probe was placed.  On initial inspection, the articular cartilage in the glenohumeral joint showed 25% grade 3 chondromalacia, which was debrided.  She had partial tearing of the anterior, superior, and posterior labrum 25%, which was debrided.  The anterior-inferior labrum and anterior-inferior glenohumeral ligament complex was intact.  Biceps tendon and anchor was partially torn and this was partially debrided, but it was otherwise well attached.  Biceps tendon showed partial tearing 25%, which was debrided.  Rotator cuff showed significant partial tearing 50% of supraspinatus and infraspinatus and this was debrided, but it was otherwise well attached, and the rest of the rotator cuff was intact.  Inferior capsular recess free of pathology. Subacromial space was entered and a lateral arthroscopic portal was made.  Moderately thickened bursitis was resected.  The rotator cuff was frayed and partially torn 25% on the bursal surface,  which was debrided, but it was otherwise well attached.  Impingement was noted and a subacromial decompression was carried out by removing 6-8 mm of the undersurface of the anterior, anterolateral, anteromedial acromion, and CA ligament release carried out as well.  The [facility] joint showed significant spurring and degenerative changes.  The distal 8-10 mm of clavicle was resected with a 6-mm bur.  After this was done, the shoulder could be brought through a satisfactory range of motion with no impingement on the rotator cuff.   At this point, I felt that all pathology had been satisfactorily addressed.  Instruments removed. Portals closed with 3-0 nylon suture.  Sterile dressings were applied and a sling, and the patient awakened and taken to recovery room in stable condition.  FOLLOWUP CARE:  Ms. Kohl will be followed overnight [facility] for IV pain control, neurovascular monitoring, and oxygenation monitoring.  She will be discharged tomorrow on oxycodone for pain.  See me back in the office in a week for sutures out and follow up.     Setsuko Robins A. Thurston Hole, M.D.     RAW/MEDQ  D:  06/27/2012  T:  06/28/2012  Job:  [id number]

## 2012-06-29 DIAGNOSIS — M67919 Unspecified disorder of synovium and tendon, unspecified shoulder: Secondary | ICD-10-CM | POA: Diagnosis not present

## 2012-06-29 DIAGNOSIS — M75110 Incomplete rotator cuff tear or rupture of unspecified shoulder, not specified as traumatic: Secondary | ICD-10-CM | POA: Diagnosis not present

## 2012-07-03 ENCOUNTER — Encounter (HOSPITAL_BASED_OUTPATIENT_CLINIC_OR_DEPARTMENT_OTHER): Payer: Self-pay

## 2012-07-04 DIAGNOSIS — M47812 Spondylosis without myelopathy or radiculopathy, cervical region: Secondary | ICD-10-CM | POA: Diagnosis not present

## 2012-07-05 DIAGNOSIS — M75110 Incomplete rotator cuff tear or rupture of unspecified shoulder, not specified as traumatic: Secondary | ICD-10-CM | POA: Diagnosis not present

## 2012-07-05 DIAGNOSIS — K219 Gastro-esophageal reflux disease without esophagitis: Secondary | ICD-10-CM | POA: Diagnosis not present

## 2012-07-05 DIAGNOSIS — R1013 Epigastric pain: Secondary | ICD-10-CM | POA: Diagnosis not present

## 2012-07-05 DIAGNOSIS — M719 Bursopathy, unspecified: Secondary | ICD-10-CM | POA: Diagnosis not present

## 2012-07-19 DIAGNOSIS — M75110 Incomplete rotator cuff tear or rupture of unspecified shoulder, not specified as traumatic: Secondary | ICD-10-CM | POA: Diagnosis not present

## 2012-07-19 DIAGNOSIS — M67919 Unspecified disorder of synovium and tendon, unspecified shoulder: Secondary | ICD-10-CM | POA: Diagnosis not present

## 2012-07-26 DIAGNOSIS — R1013 Epigastric pain: Secondary | ICD-10-CM | POA: Diagnosis not present

## 2012-07-26 DIAGNOSIS — K219 Gastro-esophageal reflux disease without esophagitis: Secondary | ICD-10-CM | POA: Diagnosis not present

## 2012-07-27 DIAGNOSIS — M67919 Unspecified disorder of synovium and tendon, unspecified shoulder: Secondary | ICD-10-CM | POA: Diagnosis not present

## 2012-07-27 DIAGNOSIS — M75110 Incomplete rotator cuff tear or rupture of unspecified shoulder, not specified as traumatic: Secondary | ICD-10-CM | POA: Diagnosis not present

## 2012-08-03 DIAGNOSIS — IMO0001 Reserved for inherently not codable concepts without codable children: Secondary | ICD-10-CM | POA: Diagnosis not present

## 2012-08-03 DIAGNOSIS — M67919 Unspecified disorder of synovium and tendon, unspecified shoulder: Secondary | ICD-10-CM | POA: Diagnosis not present

## 2012-08-03 DIAGNOSIS — M75110 Incomplete rotator cuff tear or rupture of unspecified shoulder, not specified as traumatic: Secondary | ICD-10-CM | POA: Diagnosis not present

## 2012-08-03 DIAGNOSIS — M719 Bursopathy, unspecified: Secondary | ICD-10-CM | POA: Diagnosis not present

## 2012-08-10 DIAGNOSIS — M75110 Incomplete rotator cuff tear or rupture of unspecified shoulder, not specified as traumatic: Secondary | ICD-10-CM | POA: Diagnosis not present

## 2012-08-10 DIAGNOSIS — M67919 Unspecified disorder of synovium and tendon, unspecified shoulder: Secondary | ICD-10-CM | POA: Diagnosis not present

## 2012-08-10 DIAGNOSIS — IMO0001 Reserved for inherently not codable concepts without codable children: Secondary | ICD-10-CM | POA: Diagnosis not present

## 2012-08-17 DIAGNOSIS — M67919 Unspecified disorder of synovium and tendon, unspecified shoulder: Secondary | ICD-10-CM | POA: Diagnosis not present

## 2012-08-17 DIAGNOSIS — IMO0001 Reserved for inherently not codable concepts without codable children: Secondary | ICD-10-CM | POA: Diagnosis not present

## 2012-08-17 DIAGNOSIS — M75110 Incomplete rotator cuff tear or rupture of unspecified shoulder, not specified as traumatic: Secondary | ICD-10-CM | POA: Diagnosis not present

## 2012-08-22 DIAGNOSIS — M1710 Unilateral primary osteoarthritis, unspecified knee: Secondary | ICD-10-CM | POA: Diagnosis not present

## 2012-08-25 DIAGNOSIS — M75110 Incomplete rotator cuff tear or rupture of unspecified shoulder, not specified as traumatic: Secondary | ICD-10-CM | POA: Diagnosis not present

## 2012-08-25 DIAGNOSIS — IMO0001 Reserved for inherently not codable concepts without codable children: Secondary | ICD-10-CM | POA: Diagnosis not present

## 2012-08-25 DIAGNOSIS — M67919 Unspecified disorder of synovium and tendon, unspecified shoulder: Secondary | ICD-10-CM | POA: Diagnosis not present

## 2012-08-28 DIAGNOSIS — E079 Disorder of thyroid, unspecified: Secondary | ICD-10-CM | POA: Diagnosis not present

## 2012-08-28 DIAGNOSIS — Z23 Encounter for immunization: Secondary | ICD-10-CM | POA: Diagnosis not present

## 2012-08-28 DIAGNOSIS — G4730 Sleep apnea, unspecified: Secondary | ICD-10-CM | POA: Diagnosis not present

## 2012-08-28 DIAGNOSIS — E119 Type 2 diabetes mellitus without complications: Secondary | ICD-10-CM | POA: Diagnosis not present

## 2012-08-30 DIAGNOSIS — M67919 Unspecified disorder of synovium and tendon, unspecified shoulder: Secondary | ICD-10-CM | POA: Diagnosis not present

## 2012-08-30 DIAGNOSIS — M719 Bursopathy, unspecified: Secondary | ICD-10-CM | POA: Diagnosis not present

## 2012-08-30 DIAGNOSIS — M75110 Incomplete rotator cuff tear or rupture of unspecified shoulder, not specified as traumatic: Secondary | ICD-10-CM | POA: Diagnosis not present

## 2012-08-30 DIAGNOSIS — IMO0001 Reserved for inherently not codable concepts without codable children: Secondary | ICD-10-CM | POA: Diagnosis not present

## 2012-09-06 DIAGNOSIS — M75110 Incomplete rotator cuff tear or rupture of unspecified shoulder, not specified as traumatic: Secondary | ICD-10-CM | POA: Diagnosis not present

## 2012-09-06 DIAGNOSIS — IMO0001 Reserved for inherently not codable concepts without codable children: Secondary | ICD-10-CM | POA: Diagnosis not present

## 2012-09-06 DIAGNOSIS — M67919 Unspecified disorder of synovium and tendon, unspecified shoulder: Secondary | ICD-10-CM | POA: Diagnosis not present

## 2012-09-13 DIAGNOSIS — M75110 Incomplete rotator cuff tear or rupture of unspecified shoulder, not specified as traumatic: Secondary | ICD-10-CM | POA: Diagnosis not present

## 2012-09-13 DIAGNOSIS — M67919 Unspecified disorder of synovium and tendon, unspecified shoulder: Secondary | ICD-10-CM | POA: Diagnosis not present

## 2012-09-13 DIAGNOSIS — IMO0001 Reserved for inherently not codable concepts without codable children: Secondary | ICD-10-CM | POA: Diagnosis not present

## 2012-09-18 DIAGNOSIS — IMO0001 Reserved for inherently not codable concepts without codable children: Secondary | ICD-10-CM | POA: Diagnosis not present

## 2012-09-18 DIAGNOSIS — M75110 Incomplete rotator cuff tear or rupture of unspecified shoulder, not specified as traumatic: Secondary | ICD-10-CM | POA: Diagnosis not present

## 2012-09-18 DIAGNOSIS — M719 Bursopathy, unspecified: Secondary | ICD-10-CM | POA: Diagnosis not present

## 2012-09-26 DIAGNOSIS — IMO0001 Reserved for inherently not codable concepts without codable children: Secondary | ICD-10-CM | POA: Diagnosis not present

## 2012-09-26 DIAGNOSIS — M75110 Incomplete rotator cuff tear or rupture of unspecified shoulder, not specified as traumatic: Secondary | ICD-10-CM | POA: Diagnosis not present

## 2012-09-26 DIAGNOSIS — M67919 Unspecified disorder of synovium and tendon, unspecified shoulder: Secondary | ICD-10-CM | POA: Diagnosis not present

## 2012-10-03 DIAGNOSIS — M47812 Spondylosis without myelopathy or radiculopathy, cervical region: Secondary | ICD-10-CM | POA: Diagnosis not present

## 2012-10-13 DIAGNOSIS — E119 Type 2 diabetes mellitus without complications: Secondary | ICD-10-CM | POA: Diagnosis not present

## 2012-10-13 DIAGNOSIS — R51 Headache: Secondary | ICD-10-CM | POA: Diagnosis not present

## 2012-10-23 DIAGNOSIS — S43429A Sprain of unspecified rotator cuff capsule, initial encounter: Secondary | ICD-10-CM | POA: Diagnosis not present

## 2012-11-07 DIAGNOSIS — R1084 Generalized abdominal pain: Secondary | ICD-10-CM | POA: Diagnosis not present

## 2012-11-07 DIAGNOSIS — K219 Gastro-esophageal reflux disease without esophagitis: Secondary | ICD-10-CM | POA: Diagnosis not present

## 2012-11-07 DIAGNOSIS — K589 Irritable bowel syndrome without diarrhea: Secondary | ICD-10-CM | POA: Diagnosis not present

## 2012-11-14 DIAGNOSIS — E785 Hyperlipidemia, unspecified: Secondary | ICD-10-CM | POA: Diagnosis not present

## 2012-11-14 DIAGNOSIS — I10 Essential (primary) hypertension: Secondary | ICD-10-CM | POA: Diagnosis not present

## 2012-11-14 DIAGNOSIS — E1149 Type 2 diabetes mellitus with other diabetic neurological complication: Secondary | ICD-10-CM | POA: Diagnosis not present

## 2012-11-14 DIAGNOSIS — G609 Hereditary and idiopathic neuropathy, unspecified: Secondary | ICD-10-CM | POA: Diagnosis not present

## 2012-11-30 DIAGNOSIS — J019 Acute sinusitis, unspecified: Secondary | ICD-10-CM | POA: Diagnosis not present

## 2012-12-25 DIAGNOSIS — Z0000 Encounter for general adult medical examination without abnormal findings: Secondary | ICD-10-CM | POA: Diagnosis not present

## 2012-12-25 DIAGNOSIS — E1149 Type 2 diabetes mellitus with other diabetic neurological complication: Secondary | ICD-10-CM | POA: Diagnosis not present

## 2012-12-25 DIAGNOSIS — E039 Hypothyroidism, unspecified: Secondary | ICD-10-CM | POA: Diagnosis not present

## 2012-12-25 DIAGNOSIS — E559 Vitamin D deficiency, unspecified: Secondary | ICD-10-CM | POA: Diagnosis not present

## 2012-12-25 DIAGNOSIS — Z13820 Encounter for screening for osteoporosis: Secondary | ICD-10-CM | POA: Diagnosis not present

## 2012-12-25 DIAGNOSIS — I10 Essential (primary) hypertension: Secondary | ICD-10-CM | POA: Diagnosis not present

## 2012-12-25 DIAGNOSIS — E785 Hyperlipidemia, unspecified: Secondary | ICD-10-CM | POA: Diagnosis not present

## 2012-12-27 DIAGNOSIS — Z1231 Encounter for screening mammogram for malignant neoplasm of breast: Secondary | ICD-10-CM | POA: Diagnosis not present

## 2012-12-29 DIAGNOSIS — M47812 Spondylosis without myelopathy or radiculopathy, cervical region: Secondary | ICD-10-CM | POA: Diagnosis not present

## 2013-01-02 DIAGNOSIS — E1149 Type 2 diabetes mellitus with other diabetic neurological complication: Secondary | ICD-10-CM | POA: Diagnosis not present

## 2013-01-02 DIAGNOSIS — E785 Hyperlipidemia, unspecified: Secondary | ICD-10-CM | POA: Diagnosis not present

## 2013-01-02 DIAGNOSIS — G609 Hereditary and idiopathic neuropathy, unspecified: Secondary | ICD-10-CM | POA: Diagnosis not present

## 2013-01-02 DIAGNOSIS — I10 Essential (primary) hypertension: Secondary | ICD-10-CM | POA: Diagnosis not present

## 2013-01-08 DIAGNOSIS — M62838 Other muscle spasm: Secondary | ICD-10-CM | POA: Diagnosis not present

## 2013-01-08 DIAGNOSIS — G4489 Other headache syndrome: Secondary | ICD-10-CM | POA: Diagnosis not present

## 2013-01-08 DIAGNOSIS — M542 Cervicalgia: Secondary | ICD-10-CM | POA: Diagnosis not present

## 2013-01-08 DIAGNOSIS — M531 Cervicobrachial syndrome: Secondary | ICD-10-CM | POA: Diagnosis not present

## 2013-01-16 DIAGNOSIS — M47812 Spondylosis without myelopathy or radiculopathy, cervical region: Secondary | ICD-10-CM | POA: Diagnosis not present

## 2013-01-16 DIAGNOSIS — G5620 Lesion of ulnar nerve, unspecified upper limb: Secondary | ICD-10-CM | POA: Diagnosis not present

## 2013-01-30 DIAGNOSIS — H02839 Dermatochalasis of unspecified eye, unspecified eyelid: Secondary | ICD-10-CM | POA: Diagnosis not present

## 2013-01-30 DIAGNOSIS — E119 Type 2 diabetes mellitus without complications: Secondary | ICD-10-CM | POA: Diagnosis not present

## 2013-01-30 DIAGNOSIS — H539 Unspecified visual disturbance: Secondary | ICD-10-CM | POA: Diagnosis not present

## 2013-01-30 DIAGNOSIS — H43399 Other vitreous opacities, unspecified eye: Secondary | ICD-10-CM | POA: Diagnosis not present

## 2013-02-06 DIAGNOSIS — G5620 Lesion of ulnar nerve, unspecified upper limb: Secondary | ICD-10-CM | POA: Diagnosis not present

## 2013-03-28 DIAGNOSIS — E039 Hypothyroidism, unspecified: Secondary | ICD-10-CM | POA: Diagnosis not present

## 2013-03-28 DIAGNOSIS — E1149 Type 2 diabetes mellitus with other diabetic neurological complication: Secondary | ICD-10-CM | POA: Diagnosis not present

## 2013-03-28 DIAGNOSIS — I10 Essential (primary) hypertension: Secondary | ICD-10-CM | POA: Diagnosis not present

## 2013-04-05 DIAGNOSIS — E1149 Type 2 diabetes mellitus with other diabetic neurological complication: Secondary | ICD-10-CM | POA: Diagnosis not present

## 2013-04-05 DIAGNOSIS — G609 Hereditary and idiopathic neuropathy, unspecified: Secondary | ICD-10-CM | POA: Diagnosis not present

## 2013-04-05 DIAGNOSIS — I10 Essential (primary) hypertension: Secondary | ICD-10-CM | POA: Diagnosis not present

## 2013-04-05 DIAGNOSIS — E785 Hyperlipidemia, unspecified: Secondary | ICD-10-CM | POA: Diagnosis not present

## 2013-04-06 DIAGNOSIS — M531 Cervicobrachial syndrome: Secondary | ICD-10-CM | POA: Diagnosis not present

## 2013-04-06 DIAGNOSIS — G4489 Other headache syndrome: Secondary | ICD-10-CM | POA: Diagnosis not present

## 2013-04-06 DIAGNOSIS — M62838 Other muscle spasm: Secondary | ICD-10-CM | POA: Diagnosis not present

## 2013-04-06 DIAGNOSIS — M542 Cervicalgia: Secondary | ICD-10-CM | POA: Diagnosis not present

## 2013-04-10 DIAGNOSIS — M542 Cervicalgia: Secondary | ICD-10-CM | POA: Diagnosis not present

## 2013-04-12 DIAGNOSIS — M542 Cervicalgia: Secondary | ICD-10-CM | POA: Diagnosis not present

## 2013-04-18 DIAGNOSIS — M542 Cervicalgia: Secondary | ICD-10-CM | POA: Diagnosis not present

## 2013-04-22 DIAGNOSIS — G589 Mononeuropathy, unspecified: Secondary | ICD-10-CM | POA: Diagnosis not present

## 2013-04-22 DIAGNOSIS — E119 Type 2 diabetes mellitus without complications: Secondary | ICD-10-CM | POA: Diagnosis not present

## 2013-04-22 DIAGNOSIS — E079 Disorder of thyroid, unspecified: Secondary | ICD-10-CM | POA: Diagnosis not present

## 2013-04-22 DIAGNOSIS — Z886 Allergy status to analgesic agent status: Secondary | ICD-10-CM | POA: Diagnosis not present

## 2013-04-22 DIAGNOSIS — I10 Essential (primary) hypertension: Secondary | ICD-10-CM | POA: Diagnosis not present

## 2013-04-22 DIAGNOSIS — M47817 Spondylosis without myelopathy or radiculopathy, lumbosacral region: Secondary | ICD-10-CM | POA: Diagnosis not present

## 2013-04-22 DIAGNOSIS — M949 Disorder of cartilage, unspecified: Secondary | ICD-10-CM | POA: Diagnosis not present

## 2013-04-22 DIAGNOSIS — M8448XA Pathological fracture, other site, initial encounter for fracture: Secondary | ICD-10-CM | POA: Diagnosis not present

## 2013-04-22 DIAGNOSIS — Z79899 Other long term (current) drug therapy: Secondary | ICD-10-CM | POA: Diagnosis not present

## 2013-04-22 DIAGNOSIS — IMO0002 Reserved for concepts with insufficient information to code with codable children: Secondary | ICD-10-CM | POA: Diagnosis not present

## 2013-04-22 DIAGNOSIS — E785 Hyperlipidemia, unspecified: Secondary | ICD-10-CM | POA: Diagnosis not present

## 2013-04-22 DIAGNOSIS — S32009A Unspecified fracture of unspecified lumbar vertebra, initial encounter for closed fracture: Secondary | ICD-10-CM | POA: Diagnosis not present

## 2013-05-03 DIAGNOSIS — M25569 Pain in unspecified knee: Secondary | ICD-10-CM | POA: Diagnosis not present

## 2013-05-09 DIAGNOSIS — M1710 Unilateral primary osteoarthritis, unspecified knee: Secondary | ICD-10-CM | POA: Diagnosis not present

## 2013-05-09 DIAGNOSIS — Z01818 Encounter for other preprocedural examination: Secondary | ICD-10-CM | POA: Diagnosis not present

## 2013-05-10 DIAGNOSIS — M47817 Spondylosis without myelopathy or radiculopathy, lumbosacral region: Secondary | ICD-10-CM | POA: Diagnosis not present

## 2013-05-10 DIAGNOSIS — IMO0002 Reserved for concepts with insufficient information to code with codable children: Secondary | ICD-10-CM | POA: Diagnosis not present

## 2013-05-31 DIAGNOSIS — M531 Cervicobrachial syndrome: Secondary | ICD-10-CM | POA: Diagnosis not present

## 2013-05-31 DIAGNOSIS — M542 Cervicalgia: Secondary | ICD-10-CM | POA: Diagnosis not present

## 2013-05-31 DIAGNOSIS — G4489 Other headache syndrome: Secondary | ICD-10-CM | POA: Diagnosis not present

## 2013-06-19 ENCOUNTER — Encounter (HOSPITAL_COMMUNITY): Payer: Self-pay | Admitting: Pharmacy Technician

## 2013-06-20 ENCOUNTER — Other Ambulatory Visit: Payer: Self-pay | Admitting: Physician Assistant

## 2013-06-20 ENCOUNTER — Encounter: Payer: Self-pay | Admitting: Physician Assistant

## 2013-06-20 DIAGNOSIS — M1712 Unilateral primary osteoarthritis, left knee: Secondary | ICD-10-CM

## 2013-06-20 DIAGNOSIS — M25569 Pain in unspecified knee: Secondary | ICD-10-CM | POA: Diagnosis not present

## 2013-06-20 NOTE — H&P (Signed)
TOTAL KNEE ADMISSION H&P  Patient is being admitted for left total knee arthroplasty.  Subjective:  Chief Complaint:left knee pain.  HPI: Megan Horton, 72 y.o. female, has a history of pain and functional disability in the left knee due to arthritis and has failed non-surgical conservative treatments for greater than 12 weeks to includeNSAID's and/or analgesics, corticosteriod injections, flexibility and strengthening excercises, supervised PT with diminished ADL's post treatment, use of assistive devices and activity modification.  Onset of symptoms was gradual, starting >10 years ago with gradually worsening course since that time. The patient noted prior procedures on the knee to include  arthroscopy and menisectomy on the left knee(s).  Patient currently rates pain in the left knee(s) at 10 out of 10 with activity. Patient has night pain, worsening of pain with activity and weight bearing, pain that interferes with activities of daily living, crepitus and joint swelling.  Patient has evidence of subchondral sclerosis, periarticular osteophytes and joint space narrowing by imaging studies. There is no active infection.  Patient Active Problem List   Diagnosis Date Noted  . Left knee DJD   . Hypertension   . Peripheral vascular disease   . Peripheral neuropathy   . Seasonal allergies   . Diabetes mellitus   . Hypothyroidism   . GERD (gastroesophageal reflux disease)   . Arthritis   . Sleep apnea   . Complication of anesthesia   . Adhesive capsulitis of right shoulder   . Partial tear of rotator cuff    Past Medical History  Diagnosis Date  . Hypertension   . Peripheral vascular disease   . Peripheral neuropathy   . Seasonal allergies   . Diabetes mellitus   . Hypothyroidism   . GERD (gastroesophageal reflux disease)   . Arthritis   . Sleep apnea     CPAP, sleep study 3-4 years ago in [place], [place] Dr. Horace Porteous   . Wears glasses   . Adhesive capsulitis of right shoulder    . Partial tear of rotator cuff(726.13)   . Complication of anesthesia 5/13    did have some disorientation post cerv fusion due to low sats  . Left knee DJD     Past Surgical History  Procedure Laterality Date  . Abdominal hysterectomy    . Cholecystectomy  2009  . Hemorrhoid surgery  2009  . Knee arthroscopy      Left x2  . Carpal tunnel release      bilat  . Cervical fusion    . Tonsillectomy    . Varicose vein surgery      Right  . Heel spur excision      bilat  . Nose surgery    . Anterior cervical decomp/discectomy fusion  03/10/2012    Procedure: ANTERIOR CERVICAL DECOMPRESSION/DISCECTOMY FUSION 1 LEVEL;  Surgeon: Tia Alert, MD;  Location: [facility];  Service: Neurosurgery;  Laterality: N/A;  Cervical five-six anterior cervical decompression fusion with plating  . Eye surgery  2012    bilat cataract  . Shoulder arthroscopy  06/27/2012    Procedure: ARTHROSCOPY SHOULDER;  Surgeon: Nilda Simmer, MD;  Location: [facility];  Service: Orthopedics;  Laterality: Right;  right shoulder arthroscopy debridement extensive, distal clavulectomy, with resect adhesions with maniplulation     (Not in a hospital admission) Allergies  Allergen Reactions  . Adhesive [Tape] Other (See Comments)    REACTION: blister  . Tylenol [Acetaminophen] Nausea And Vomiting    History  Substance Use Topics  .  Smoking status: Former Smoker -- 0.25 packs/day for 16 years    Types: Cigarettes    Quit date: 12/26/1993  . Smokeless tobacco: Never Used  . Alcohol Use: No     Comment: very rarely wine    Family History  Problem Relation Age of Onset  . Heart disease Mother   . Heart disease Father   . Heart disease Brother   . Heart attack Father   . Heart attack Brother   . Heart attack Sister   . Hypertension Mother   . Hypertension Father   . Hypertension Sister   . Stroke Mother   . Stroke Brother      Review of Systems  Constitutional: Negative.   HENT:  Negative.   Eyes: Negative.   Respiratory: Positive for cough. Negative for hemoptysis, sputum production, shortness of breath and wheezing.   Cardiovascular: Negative.   Gastrointestinal: Negative.   Genitourinary: Negative.   Musculoskeletal: Positive for back pain and joint pain.  Skin: Negative.   Neurological: Negative.   Endo/Heme/Allergies: Negative.   Psychiatric/Behavioral: Negative.     Objective:  Physical Exam  Constitutional: She is oriented to person, place, and time. She appears well-developed and well-nourished.  HENT:  Head: Normocephalic and atraumatic.  Eyes: Conjunctivae and EOM are normal. Pupils are equal, round, and reactive to light.  Neck: Neck supple.  Cardiovascular: Normal rate, regular rhythm and normal heart sounds.   Respiratory: Effort normal. No respiratory distress. She has no wheezes. She has no rales.  Congested cough     Chronic sinus per patient  GI: Soft.  Genitourinary:  Not pertinent to current symptomatology therefore not examined.  Musculoskeletal:  Examination of her left knee reveals pain medially and laterally.  1+ synovitis.  1+ crepitation.  Range of motion -5 to 100 degrees.  Knee is stable with normal patella tracking and pain.  Examination of her right knee reveals full range of motion without pain, swelling, weakness or instability.  Lumbar exam reveals diffuse lumbar pain radiating into her left leg.  Neurologic exam: Straight leg raising positive on the left at 70 degrees, negative on the right.  Motor and sensory examination is within normal limits.  Neurological: She is alert and oriented to person, place, and time.  Skin: Skin is warm and dry.  Psychiatric: She has a normal mood and affect. Her behavior is normal.    Vit Last recorded: 08/13 1500   BP: 136/77 Pulse: 74  Temp: 98 F (36.7 C)    Height: 5\' 5"  (1.651 m) SpO2: 96  Weight: 107.956 kg (238 lb)   al signs in last 24 hours:   Labs:   Estimated body  mass index is 39.61 kg/(m^2) as calculated from the following:   Height as of this encounter: 5\' 5"  (1.651 m).   Weight as of this encounter: 107.956 kg (238 lb).   Imaging Review Plain radiographs demonstrate severe degenerative joint disease of the left knee(s). The overall alignment ismild varus. The bone quality appears to be good for age and reported activity level.  Assessment/Plan:  End stage arthritis, left knee   The patient history, physical examination, clinical judgment of the provider and imaging studies are consistent with end stage degenerative joint disease of the left knee(s) and total knee arthroplasty is deemed medically necessary. The treatment options including medical management, injection therapy arthroscopy and arthroplasty were discussed at length. The risks and benefits of total knee arthroplasty were presented and reviewed. The risks due to aseptic  loosening, infection, stiffness, patella tracking problems, thromboembolic complications and other imponderables were discussed. The patient acknowledged the explanation, agreed to proceed with the plan and consent was signed. Patient is being admitted for inpatient treatment for surgery, pain control, PT, OT, prophylactic antibiotics, VTE prophylaxis, progressive ambulation and ADL's and discharge planning. The patient is planning to be discharged to skilled nursing facility [facility]  Soyla Bainter A. Gwinda Passe Physician Assistant [facility] [phone]  06/20/2013, 3:28 PM

## 2013-06-22 NOTE — Pre-Procedure Instructions (Signed)
Megan Horton  06/22/2013   Your procedure is scheduled on:  Monday July 02, 2013.  Report to [facility] at 9:00 AM.  Call this number if you have problems the morning of surgery: [phone]   Remember:   Do not eat food or drink liquids after midnight.   Take these medicines the morning of surgery with A SIP OF WATER: Amlodipine (Norvasc), Dexlansoprazole (Dexilant), Levothyroxine (Synthroid)  Do NOT take any diabetic medications the morning of your surgery   Do not wear jewelry, make-up or nail polish.  Do not wear lotions, powders, or perfumes. You may wear deodorant.  Do not shave 48 hours prior to surgery.   Do not bring valuables to the hospital.  [facility] is not responsible for any belongings or valuables.  Contacts, dentures or bridgework may not be worn into surgery.  Leave suitcase in the car. After surgery it may be brought to your room.  For patients admitted to the hospital, checkout time is 11:00 AM the day of discharge.   Patients discharged the day of surgery will not be allowed to drive home.  Name and phone number of your driver: Family/Friend  Special Instructions: Shower using CHG 2 nights before surgery and the night before surgery.  If you shower the day of surgery use CHG.  Use special wash - you have one bottle of CHG for all showers.  You should use approximately 1/3 of the bottle for each shower.   Please read over the following fact sheets that you were given: Pain Booklet, Coughing and Deep Breathing, Blood Transfusion Information, Total Joint Packet, MRSA Information and Surgical Site Infection Prevention

## 2013-06-25 ENCOUNTER — Ambulatory Visit (HOSPITAL_COMMUNITY)
Admission: RE | Admit: 2013-06-25 | Discharge: 2013-06-25 | Disposition: A | Discharge status: Home / Self Care | Payer: Medicare Other | Source: Ambulatory Visit | Attending: Physician Assistant | Admitting: Physician Assistant

## 2013-06-25 ENCOUNTER — Encounter (HOSPITAL_COMMUNITY): Payer: Self-pay

## 2013-06-25 ENCOUNTER — Encounter (HOSPITAL_COMMUNITY)
Admission: RE | Admit: 2013-06-25 | Discharge: 2013-06-25 | Disposition: A | Discharge status: Home / Self Care | Payer: Medicare Other | Source: Ambulatory Visit | Attending: Orthopedic Surgery | Admitting: Orthopedic Surgery

## 2013-06-25 DIAGNOSIS — Z01818 Encounter for other preprocedural examination: Secondary | ICD-10-CM | POA: Insufficient documentation

## 2013-06-25 DIAGNOSIS — M549 Dorsalgia, unspecified: Secondary | ICD-10-CM | POA: Diagnosis not present

## 2013-06-25 DIAGNOSIS — Z01812 Encounter for preprocedural laboratory examination: Secondary | ICD-10-CM | POA: Diagnosis not present

## 2013-06-25 DIAGNOSIS — Z0183 Encounter for blood typing: Secondary | ICD-10-CM | POA: Insufficient documentation

## 2013-06-25 HISTORY — DX: Urgency of urination: R39.15

## 2013-06-25 HISTORY — DX: Hyperlipidemia, unspecified: E78.5

## 2013-06-25 LAB — COMPREHENSIVE METABOLIC PANEL
AST: 37 U/L (ref 0–37)
BUN: 9 mg/dL (ref 6–23)
CO2: 28 mEq/L (ref 19–32)
Chloride: 104 mEq/L (ref 96–112)
Creatinine, Ser: 0.71 mg/dL (ref 0.50–1.10)
GFR calc Af Amer: >90 mL/min (ref >90)
GFR calc non Af Amer: 85 mL/min — ABNORMAL LOW (ref >90)
Glucose, Bld: 87 mg/dL (ref 70–99)
Total Bilirubin: 0.4 mg/dL (ref 0.3–1.2)

## 2013-06-25 LAB — PROTIME-INR: INR: 0.93 (ref 0.00–1.49)

## 2013-06-25 LAB — CBC WITH DIFFERENTIAL/PLATELET
Basophils Relative: 1 % (ref 0–1)
Eosinophils Relative: 6 % — ABNORMAL HIGH (ref 0–5)
HCT: 42.3 % (ref 36.0–46.0)
Lymphs Abs: 1.3 10*3/uL (ref 0.7–4.0)
MCH: 30.2 pg (ref 26.0–34.0)
MCV: 91.2 fL (ref 78.0–100.0)
Monocytes Relative: 10 % (ref 3–12)
Neutro Abs: 4 10*3/uL (ref 1.7–7.7)
RBC: 4.64 MIL/uL (ref 3.87–5.11)
WBC: 6.4 10*3/uL (ref 4.0–10.5)

## 2013-06-25 LAB — URINALYSIS, ROUTINE W REFLEX MICROSCOPIC
Glucose, UA: NEGATIVE mg/dL
Ketones, ur: NEGATIVE mg/dL
Leukocytes, UA: NEGATIVE
pH: 5.5 (ref 5.0–8.0)

## 2013-06-25 LAB — ABO/RH: ABO/RH(D): O POS

## 2013-06-25 NOTE — Progress Notes (Signed)
Patient informed Nurse that she had a stress test several years ago in [place] (> 5 years) and denied having a cardiac cath. Patient does have sleep apnea and wears a CPAP nightly. Patient instructed to bring CPAP mask the DOS. Patient verbalized understanding. PCP is Dr. Thayer Headings and patient informed Nurse that she had a EKG performed at his office this year. Will request records from PCP. Office # [phone] fax [phone].

## 2013-06-27 ENCOUNTER — Other Ambulatory Visit: Payer: Self-pay | Admitting: Neurological Surgery

## 2013-06-27 DIAGNOSIS — M549 Dorsalgia, unspecified: Secondary | ICD-10-CM

## 2013-06-27 LAB — URINE CULTURE: Culture: NO GROWTH

## 2013-06-29 ENCOUNTER — Ambulatory Visit
Admission: RE | Admit: 2013-06-29 | Discharge: 2013-06-29 | Disposition: A | Discharge status: Home / Self Care | Payer: Medicare Other | Source: Ambulatory Visit | Attending: Neurological Surgery | Admitting: Neurological Surgery

## 2013-06-29 VITALS — BP 148/68 | HR 67

## 2013-06-29 DIAGNOSIS — M47817 Spondylosis without myelopathy or radiculopathy, lumbosacral region: Secondary | ICD-10-CM | POA: Diagnosis not present

## 2013-06-29 DIAGNOSIS — M549 Dorsalgia, unspecified: Secondary | ICD-10-CM

## 2013-06-29 DIAGNOSIS — M545 Low back pain: Secondary | ICD-10-CM | POA: Diagnosis not present

## 2013-06-29 MED ORDER — METHYLPREDNISOLONE ACETATE 40 MG/ML INJ SUSP (RADIOLOG
120.0000 mg | Freq: Once | INTRAMUSCULAR | Status: AC
  Administered 2013-06-29: 120 mg via EPIDURAL

## 2013-06-29 MED ORDER — IOHEXOL 180 MG/ML  SOLN
1.0000 mL | Freq: Once | INTRAMUSCULAR | Status: AC | PRN
  Administered 2013-06-29: 1 mL via EPIDURAL

## 2013-07-01 MED ORDER — POVIDONE-IODINE 7.5 % EX SOLN
Freq: Once | CUTANEOUS | Status: DC
  Filled 2013-07-01: qty 118

## 2013-07-01 MED ORDER — LACTATED RINGERS IV SOLN
INTRAVENOUS | Status: DC
  Administered 2013-07-02: 10:00:00 via INTRAVENOUS

## 2013-07-01 MED ORDER — CHLORHEXIDINE GLUCONATE 4 % EX LIQD: 60.0000 mL | Freq: Once | CUTANEOUS | Status: DC

## 2013-07-02 ENCOUNTER — Inpatient Hospital Stay (HOSPITAL_COMMUNITY)
Admission: RE | Admit: 2013-07-02 | Discharge: 2013-07-05 | DRG: 470 | Disposition: A | Discharge status: Skilled Nursing Facility | Payer: Medicare Other | Source: Ambulatory Visit | Attending: Orthopedic Surgery | Admitting: Orthopedic Surgery

## 2013-07-02 ENCOUNTER — Encounter (HOSPITAL_COMMUNITY): Payer: Self-pay | Admitting: Certified Registered Nurse Anesthetist

## 2013-07-02 ENCOUNTER — Inpatient Hospital Stay (HOSPITAL_COMMUNITY): Payer: Medicare Other | Admitting: Certified Registered Nurse Anesthetist

## 2013-07-02 ENCOUNTER — Encounter (HOSPITAL_COMMUNITY)
Admission: RE | Disposition: A | Discharge status: Skilled Nursing Facility | Payer: Self-pay | Source: Ambulatory Visit | Attending: Orthopedic Surgery

## 2013-07-02 DIAGNOSIS — Z96659 Presence of unspecified artificial knee joint: Secondary | ICD-10-CM | POA: Diagnosis not present

## 2013-07-02 DIAGNOSIS — T8859XA Other complications of anesthesia, initial encounter: Secondary | ICD-10-CM

## 2013-07-02 DIAGNOSIS — M1990 Unspecified osteoarthritis, unspecified site: Secondary | ICD-10-CM | POA: Diagnosis present

## 2013-07-02 DIAGNOSIS — I739 Peripheral vascular disease, unspecified: Secondary | ICD-10-CM | POA: Diagnosis present

## 2013-07-02 DIAGNOSIS — R269 Unspecified abnormalities of gait and mobility: Secondary | ICD-10-CM | POA: Diagnosis not present

## 2013-07-02 DIAGNOSIS — Z471 Aftercare following joint replacement surgery: Secondary | ICD-10-CM | POA: Diagnosis not present

## 2013-07-02 DIAGNOSIS — T4145XA Adverse effect of unspecified anesthetic, initial encounter: Secondary | ICD-10-CM

## 2013-07-02 DIAGNOSIS — D696 Thrombocytopenia, unspecified: Secondary | ICD-10-CM | POA: Diagnosis not present

## 2013-07-02 DIAGNOSIS — M75110 Incomplete rotator cuff tear or rupture of unspecified shoulder, not specified as traumatic: Secondary | ICD-10-CM | POA: Diagnosis present

## 2013-07-02 DIAGNOSIS — E119 Type 2 diabetes mellitus without complications: Secondary | ICD-10-CM | POA: Diagnosis not present

## 2013-07-02 DIAGNOSIS — M25569 Pain in unspecified knee: Secondary | ICD-10-CM | POA: Diagnosis not present

## 2013-07-02 DIAGNOSIS — M1712 Unilateral primary osteoarthritis, left knee: Secondary | ICD-10-CM | POA: Diagnosis present

## 2013-07-02 DIAGNOSIS — K219 Gastro-esophageal reflux disease without esophagitis: Secondary | ICD-10-CM | POA: Diagnosis present

## 2013-07-02 DIAGNOSIS — E785 Hyperlipidemia, unspecified: Secondary | ICD-10-CM | POA: Diagnosis not present

## 2013-07-02 DIAGNOSIS — E039 Hypothyroidism, unspecified: Secondary | ICD-10-CM | POA: Diagnosis present

## 2013-07-02 DIAGNOSIS — IMO0002 Reserved for concepts with insufficient information to code with codable children: Secondary | ICD-10-CM | POA: Diagnosis not present

## 2013-07-02 DIAGNOSIS — M1710 Unilateral primary osteoarthritis, unspecified knee: Secondary | ICD-10-CM | POA: Diagnosis not present

## 2013-07-02 DIAGNOSIS — G8918 Other acute postprocedural pain: Secondary | ICD-10-CM | POA: Diagnosis not present

## 2013-07-02 DIAGNOSIS — I10 Essential (primary) hypertension: Secondary | ICD-10-CM | POA: Diagnosis present

## 2013-07-02 DIAGNOSIS — M6281 Muscle weakness (generalized): Secondary | ICD-10-CM | POA: Diagnosis not present

## 2013-07-02 DIAGNOSIS — K2100 Gastro-esophageal reflux disease with esophagitis, without bleeding: Secondary | ICD-10-CM | POA: Diagnosis not present

## 2013-07-02 DIAGNOSIS — G4730 Sleep apnea, unspecified: Secondary | ICD-10-CM | POA: Diagnosis present

## 2013-07-02 DIAGNOSIS — Z6841 Body Mass Index (BMI) 40.0 and over, adult: Secondary | ICD-10-CM

## 2013-07-02 DIAGNOSIS — G609 Hereditary and idiopathic neuropathy, unspecified: Secondary | ICD-10-CM | POA: Diagnosis present

## 2013-07-02 DIAGNOSIS — Z87891 Personal history of nicotine dependence: Secondary | ICD-10-CM

## 2013-07-02 DIAGNOSIS — G629 Polyneuropathy, unspecified: Secondary | ICD-10-CM | POA: Diagnosis present

## 2013-07-02 HISTORY — PX: TOTAL KNEE ARTHROPLASTY: SHX125

## 2013-07-02 SURGERY — ARTHROPLASTY, KNEE, TOTAL
Anesthesia: General | Site: Knee | Laterality: Left | Wound class: Clean

## 2013-07-02 MED ORDER — LIDOCAINE HCL 1 % IJ SOLN
INTRAMUSCULAR | Status: DC | PRN
  Administered 2013-07-02: 2 mL

## 2013-07-02 MED ORDER — SIMVASTATIN 20 MG PO TABS
20.0000 mg | ORAL_TABLET | Freq: Every day | ORAL | Status: DC
  Administered 2013-07-02 – 2013-07-04 (×3): 20 mg via ORAL
  Filled 2013-07-02 (×4): qty 1

## 2013-07-02 MED ORDER — BUPIVACAINE-EPINEPHRINE 0.25% -1:200000 IJ SOLN
INTRAMUSCULAR | Status: DC | PRN
  Administered 2013-07-02: 30 mL

## 2013-07-02 MED ORDER — OXYCODONE HCL 5 MG PO TABS
5.0000 mg | ORAL_TABLET | ORAL | Status: DC | PRN
  Administered 2013-07-02 – 2013-07-03 (×4): 10 mg via ORAL
  Filled 2013-07-02 (×4): qty 2

## 2013-07-02 MED ORDER — DIPHENHYDRAMINE HCL 12.5 MG/5ML PO ELIX: 12.5000 mg | ORAL_SOLUTION | ORAL | Status: DC | PRN

## 2013-07-02 MED ORDER — OXYCODONE HCL 5 MG/5ML PO SOLN: 5.0000 mg | Freq: Once | ORAL | Status: DC | PRN

## 2013-07-02 MED ORDER — DEXAMETHASONE 6 MG PO TABS
10.0000 mg | ORAL_TABLET | Freq: Three times a day (TID) | ORAL | Status: AC
  Administered 2013-07-02 – 2013-07-03 (×2): 10 mg via ORAL
  Filled 2013-07-02 (×3): qty 1

## 2013-07-02 MED ORDER — ZOLPIDEM TARTRATE 5 MG PO TABS: 5.0000 mg | ORAL_TABLET | Freq: Every evening | ORAL | Status: DC | PRN

## 2013-07-02 MED ORDER — CENTRUM SILVER PO TABS: 1.0000 | ORAL_TABLET | Freq: Every day | ORAL | Status: DC

## 2013-07-02 MED ORDER — PANTOPRAZOLE SODIUM 40 MG PO TBEC
80.0000 mg | DELAYED_RELEASE_TABLET | Freq: Every day | ORAL | Status: DC
  Administered 2013-07-03 – 2013-07-05 (×3): 80 mg via ORAL
  Filled 2013-07-02 (×3): qty 2

## 2013-07-02 MED ORDER — MIDAZOLAM HCL 2 MG/2ML IJ SOLN
INTRAMUSCULAR | Status: AC
  Administered 2013-07-02: 1 mg
  Filled 2013-07-02: qty 2

## 2013-07-02 MED ORDER — ACETAMINOPHEN 650 MG RE SUPP: 650.0000 mg | Freq: Four times a day (QID) | RECTAL | Status: DC | PRN

## 2013-07-02 MED ORDER — ARTIFICIAL TEARS OP OINT
TOPICAL_OINTMENT | OPHTHALMIC | Status: DC | PRN
  Administered 2013-07-02: 1 via OPHTHALMIC

## 2013-07-02 MED ORDER — HYDROMORPHONE HCL PF 1 MG/ML IJ SOLN
INTRAMUSCULAR | Status: AC
  Filled 2013-07-02: qty 1

## 2013-07-02 MED ORDER — ROCURONIUM BROMIDE 100 MG/10ML IV SOLN
INTRAVENOUS | Status: DC | PRN
  Administered 2013-07-02: 50 mg via INTRAVENOUS

## 2013-07-02 MED ORDER — ROPIVACAINE HCL 5 MG/ML IJ SOLN
INTRAMUSCULAR | Status: DC | PRN
  Administered 2013-07-02: 25 mL

## 2013-07-02 MED ORDER — GABAPENTIN 300 MG PO CAPS
300.0000 mg | ORAL_CAPSULE | Freq: Every day | ORAL | Status: DC
  Administered 2013-07-02 – 2013-07-04 (×3): 300 mg via ORAL
  Filled 2013-07-02 (×4): qty 1

## 2013-07-02 MED ORDER — LEVOTHYROXINE SODIUM 100 MCG PO TABS
100.0000 ug | ORAL_TABLET | Freq: Every day | ORAL | Status: DC
  Administered 2013-07-03 – 2013-07-05 (×3): 100 ug via ORAL
  Filled 2013-07-02 (×4): qty 1

## 2013-07-02 MED ORDER — CULTURELLE PO CAPS: 1.0000 | ORAL_CAPSULE | Freq: Every day | ORAL | Status: DC

## 2013-07-02 MED ORDER — ONDANSETRON HCL 4 MG/2ML IJ SOLN: 4.0000 mg | Freq: Four times a day (QID) | INTRAMUSCULAR | Status: DC | PRN

## 2013-07-02 MED ORDER — FAMOTIDINE 20 MG PO TABS
20.0000 mg | ORAL_TABLET | Freq: Every day | ORAL | Status: DC
  Administered 2013-07-02 – 2013-07-04 (×3): 20 mg via ORAL
  Filled 2013-07-02 (×4): qty 1

## 2013-07-02 MED ORDER — BISACODYL 5 MG PO TBEC
10.0000 mg | DELAYED_RELEASE_TABLET | Freq: Every day | ORAL | Status: DC
  Administered 2013-07-02 – 2013-07-04 (×3): 10 mg via ORAL
  Filled 2013-07-02 (×3): qty 2

## 2013-07-02 MED ORDER — BUPIVACAINE-EPINEPHRINE PF 0.25-1:200000 % IJ SOLN
INTRAMUSCULAR | Status: AC
  Filled 2013-07-02: qty 30

## 2013-07-02 MED ORDER — CEFAZOLIN SODIUM-DEXTROSE 2-3 GM-% IV SOLR
2.0000 g | Freq: Once | INTRAVENOUS | Status: AC
  Administered 2013-07-02: 2 g via INTRAVENOUS

## 2013-07-02 MED ORDER — DOCUSATE SODIUM 100 MG PO CAPS
100.0000 mg | ORAL_CAPSULE | Freq: Two times a day (BID) | ORAL | Status: DC
  Administered 2013-07-02 – 2013-07-04 (×5): 100 mg via ORAL
  Filled 2013-07-02 (×7): qty 1

## 2013-07-02 MED ORDER — OXYCODONE HCL 5 MG PO TABS
5.0000 mg | ORAL_TABLET | Freq: Once | ORAL | Status: DC | PRN
Start: 2013-07-02 — End: 2013-07-02

## 2013-07-02 MED ORDER — AMLODIPINE BESYLATE 5 MG PO TABS
5.0000 mg | ORAL_TABLET | Freq: Every day | ORAL | Status: DC
  Administered 2013-07-03 – 2013-07-05 (×3): 5 mg via ORAL
  Filled 2013-07-02 (×3): qty 1

## 2013-07-02 MED ORDER — VITAMIN D 50 MCG (2000 UT) PO CAPS: 1.0000 | ORAL_CAPSULE | Freq: Every day | ORAL | Status: DC

## 2013-07-02 MED ORDER — METOCLOPRAMIDE HCL 5 MG/ML IJ SOLN: 10.0000 mg | Freq: Once | INTRAMUSCULAR | Status: DC | PRN

## 2013-07-02 MED ORDER — METOCLOPRAMIDE HCL 5 MG/ML IJ SOLN
INTRAMUSCULAR | Status: DC | PRN
  Administered 2013-07-02: 10 mg via INTRAVENOUS

## 2013-07-02 MED ORDER — ALUM & MAG HYDROXIDE-SIMETH 200-200-20 MG/5ML PO SUSP: 30.0000 mL | ORAL | Status: DC | PRN

## 2013-07-02 MED ORDER — MENTHOL 3 MG MT LOZG: 1.0000 | LOZENGE | OROMUCOSAL | Status: DC | PRN

## 2013-07-02 MED ORDER — SODIUM CHLORIDE 0.9 % IR SOLN
Status: DC | PRN
  Administered 2013-07-02: 1000 mL

## 2013-07-02 MED ORDER — PHENOL 1.4 % MT LIQD: 1.0000 | OROMUCOSAL | Status: DC | PRN

## 2013-07-02 MED ORDER — IRBESARTAN 300 MG PO TABS
300.0000 mg | ORAL_TABLET | Freq: Every day | ORAL | Status: DC
  Administered 2013-07-03 – 2013-07-05 (×3): 300 mg via ORAL
  Filled 2013-07-02 (×3): qty 1

## 2013-07-02 MED ORDER — HYDROMORPHONE HCL PF 1 MG/ML IJ SOLN: 1.0000 mg | INTRAMUSCULAR | Status: DC | PRN

## 2013-07-02 MED ORDER — CELECOXIB 200 MG PO CAPS
200.0000 mg | ORAL_CAPSULE | Freq: Two times a day (BID) | ORAL | Status: DC
  Administered 2013-07-02 – 2013-07-05 (×6): 200 mg via ORAL
  Filled 2013-07-02 (×7): qty 1

## 2013-07-02 MED ORDER — NAPHAZOLINE HCL 0.1 % OP SOLN
1.0000 [drp] | Freq: Four times a day (QID) | OPHTHALMIC | Status: DC | PRN
  Filled 2013-07-02: qty 15

## 2013-07-02 MED ORDER — METOCLOPRAMIDE HCL 10 MG PO TABS: 5.0000 mg | ORAL_TABLET | Freq: Three times a day (TID) | ORAL | Status: DC | PRN

## 2013-07-02 MED ORDER — LACTATED RINGERS IV SOLN
INTRAVENOUS | Status: DC | PRN
  Administered 2013-07-02 (×2): via INTRAVENOUS

## 2013-07-02 MED ORDER — ONDANSETRON HCL 4 MG/2ML IJ SOLN
INTRAMUSCULAR | Status: DC | PRN
  Administered 2013-07-02: 4 mg via INTRAVENOUS

## 2013-07-02 MED ORDER — INSULIN ASPART 100 UNIT/ML ~~LOC~~ SOLN
0.0000 [IU] | Freq: Every day | SUBCUTANEOUS | Status: DC
  Administered 2013-07-03: 2 [IU] via SUBCUTANEOUS

## 2013-07-02 MED ORDER — MIDAZOLAM HCL 5 MG/ML IJ SOLN: 1.0000 mg | Freq: Once | INTRAMUSCULAR | Status: AC

## 2013-07-02 MED ORDER — HYDROMORPHONE HCL PF 1 MG/ML IJ SOLN
0.2500 mg | INTRAMUSCULAR | Status: DC | PRN
  Administered 2013-07-02 (×2): 0.5 mg via INTRAVENOUS

## 2013-07-02 MED ORDER — ASPIRIN EC 325 MG PO TBEC
325.0000 mg | DELAYED_RELEASE_TABLET | Freq: Every day | ORAL | Status: DC
  Administered 2013-07-03 – 2013-07-05 (×3): 325 mg via ORAL
  Filled 2013-07-02 (×4): qty 1

## 2013-07-02 MED ORDER — INSULIN ASPART 100 UNIT/ML ~~LOC~~ SOLN
0.0000 [IU] | Freq: Three times a day (TID) | SUBCUTANEOUS | Status: DC
  Administered 2013-07-03 (×2): 5 [IU] via SUBCUTANEOUS
  Administered 2013-07-03 – 2013-07-04 (×2): 3 [IU] via SUBCUTANEOUS
  Administered 2013-07-04: 2 [IU] via SUBCUTANEOUS

## 2013-07-02 MED ORDER — LIDOCAINE HCL (CARDIAC) 20 MG/ML IV SOLN
INTRAVENOUS | Status: DC | PRN
  Administered 2013-07-02: 100 mg via INTRAVENOUS

## 2013-07-02 MED ORDER — SODIUM CHLORIDE 0.9 % IR SOLN
Status: DC | PRN
  Administered 2013-07-02: 3000 mL

## 2013-07-02 MED ORDER — FENTANYL CITRATE 0.05 MG/ML IJ SOLN
INTRAMUSCULAR | Status: AC
  Administered 2013-07-02: 50 ug via INTRAVENOUS
  Filled 2013-07-02: qty 2

## 2013-07-02 MED ORDER — POTASSIUM CHLORIDE IN NACL 20-0.9 MEQ/L-% IV SOLN
INTRAVENOUS | Status: DC
  Administered 2013-07-02 – 2013-07-03 (×2): via INTRAVENOUS
  Filled 2013-07-02 (×8): qty 1000

## 2013-07-02 MED ORDER — FENTANYL CITRATE 0.05 MG/ML IJ SOLN
INTRAMUSCULAR | Status: DC | PRN
  Administered 2013-07-02: 100 ug via INTRAVENOUS
  Administered 2013-07-02: 50 ug via INTRAVENOUS

## 2013-07-02 MED ORDER — CEFAZOLIN SODIUM-DEXTROSE 2-3 GM-% IV SOLR
INTRAVENOUS | Status: AC
  Filled 2013-07-02: qty 50

## 2013-07-02 MED ORDER — METOCLOPRAMIDE HCL 5 MG/ML IJ SOLN: 5.0000 mg | Freq: Three times a day (TID) | INTRAMUSCULAR | Status: DC | PRN

## 2013-07-02 MED ORDER — NEOSTIGMINE METHYLSULFATE 1 MG/ML IJ SOLN
INTRAMUSCULAR | Status: DC | PRN
  Administered 2013-07-02: 5 mg via INTRAVENOUS

## 2013-07-02 MED ORDER — BACID PO TABS
1.0000 | ORAL_TABLET | Freq: Every day | ORAL | Status: DC
  Administered 2013-07-03 – 2013-07-05 (×3): 1 via ORAL
  Filled 2013-07-02 (×3): qty 1

## 2013-07-02 MED ORDER — ACETAMINOPHEN 325 MG PO TABS: 650.0000 mg | ORAL_TABLET | Freq: Four times a day (QID) | ORAL | Status: DC | PRN

## 2013-07-02 MED ORDER — CEFAZOLIN SODIUM-DEXTROSE 2-3 GM-% IV SOLR
2.0000 g | Freq: Four times a day (QID) | INTRAVENOUS | Status: AC
  Administered 2013-07-02 – 2013-07-03 (×2): 2 g via INTRAVENOUS
  Filled 2013-07-02 (×2): qty 50

## 2013-07-02 MED ORDER — OLOPATADINE HCL 0.6 % NA SOLN: 1.0000 | Freq: Every day | NASAL | Status: DC | PRN

## 2013-07-02 MED ORDER — ADULT MULTIVITAMIN W/MINERALS CH
1.0000 | ORAL_TABLET | Freq: Every day | ORAL | Status: DC
  Administered 2013-07-03 – 2013-07-05 (×3): 1 via ORAL
  Filled 2013-07-02 (×3): qty 1

## 2013-07-02 MED ORDER — DEXAMETHASONE SODIUM PHOSPHATE 10 MG/ML IJ SOLN
10.0000 mg | Freq: Three times a day (TID) | INTRAMUSCULAR | Status: AC
  Administered 2013-07-03: 10 mg via INTRAVENOUS
  Filled 2013-07-02 (×3): qty 1

## 2013-07-02 MED ORDER — METFORMIN HCL 500 MG PO TABS
500.0000 mg | ORAL_TABLET | Freq: Every day | ORAL | Status: DC
  Administered 2013-07-04 – 2013-07-05 (×2): 500 mg via ORAL
  Filled 2013-07-02 (×3): qty 1

## 2013-07-02 MED ORDER — MIDAZOLAM HCL 5 MG/5ML IJ SOLN
INTRAMUSCULAR | Status: DC | PRN
  Administered 2013-07-02: 1 mg via INTRAVENOUS

## 2013-07-02 MED ORDER — ONDANSETRON HCL 4 MG PO TABS: 4.0000 mg | ORAL_TABLET | Freq: Four times a day (QID) | ORAL | Status: DC | PRN

## 2013-07-02 MED ORDER — GLYCOPYRROLATE 0.2 MG/ML IJ SOLN
INTRAMUSCULAR | Status: DC | PRN
  Administered 2013-07-02: .8 mg via INTRAVENOUS

## 2013-07-02 MED ORDER — CHLORZOXAZONE 500 MG PO TABS
500.0000 mg | ORAL_TABLET | Freq: Three times a day (TID) | ORAL | Status: DC | PRN
  Filled 2013-07-02: qty 1

## 2013-07-02 MED ORDER — INSULIN ASPART 100 UNIT/ML ~~LOC~~ SOLN
4.0000 [IU] | Freq: Three times a day (TID) | SUBCUTANEOUS | Status: DC
  Administered 2013-07-03 – 2013-07-05 (×6): 4 [IU] via SUBCUTANEOUS

## 2013-07-02 MED ORDER — VITAMIN D3 25 MCG (1000 UNIT) PO TABS
2000.0000 [IU] | ORAL_TABLET | Freq: Every day | ORAL | Status: DC
  Administered 2013-07-03 – 2013-07-05 (×3): 2000 [IU] via ORAL
  Filled 2013-07-02 (×3): qty 2

## 2013-07-02 MED ORDER — FENTANYL CITRATE 0.05 MG/ML IJ SOLN: 50.0000 ug | Freq: Once | INTRAMUSCULAR | Status: AC

## 2013-07-02 MED ORDER — PROPOFOL 10 MG/ML IV BOLUS
INTRAVENOUS | Status: DC | PRN
  Administered 2013-07-02: 200 mg via INTRAVENOUS
  Administered 2013-07-02: 40 mg via INTRAVENOUS
  Administered 2013-07-02: 20 mg via INTRAVENOUS

## 2013-07-02 SURGICAL SUPPLY — 69 items
BANDAGE ESMARK 6X9 LF (GAUZE/BANDAGES/DRESSINGS) ×1 IMPLANT
BLADE SAGITTAL 25.0X1.19X90 (BLADE) ×2 IMPLANT
BLADE SAW SGTL 11.0X1.19X90.0M (BLADE) IMPLANT
BLADE SAW SGTL 13.0X1.19X90.0M (BLADE) ×2 IMPLANT
BLADE SURG 10 STRL SS (BLADE) ×10 IMPLANT
BNDG ELASTIC 6X15 VLCR STRL LF (GAUZE/BANDAGES/DRESSINGS) ×2 IMPLANT
BNDG ESMARK 6X9 LF (GAUZE/BANDAGES/DRESSINGS) ×2
BOWL SMART MIX CTS (DISPOSABLE) ×2 IMPLANT
CAPT RP KNEE ×2 IMPLANT
CEMENT HV SMART SET (Cement) ×4 IMPLANT
CLOTH BEACON ORANGE TIMEOUT ST (SAFETY) ×2 IMPLANT
CLSR STERI-STRIP ANTIMIC 1/2X4 (GAUZE/BANDAGES/DRESSINGS) ×2 IMPLANT
CO AXIAL FAN SPRAY TIP SOFT SH (MISCELLANEOUS) ×2 IMPLANT
COVER SURGICAL LIGHT HANDLE (MISCELLANEOUS) ×2 IMPLANT
CUFF TOURNIQUET SINGLE 34IN LL (TOURNIQUET CUFF) ×2 IMPLANT
CUFF TOURNIQUET SINGLE 44IN (TOURNIQUET CUFF) IMPLANT
DRAPE EXTREMITY T 121X128X90 (DRAPE) ×2 IMPLANT
DRAPE INCISE IOBAN 66X45 STRL (DRAPES) ×4 IMPLANT
DRAPE PROXIMA HALF (DRAPES) ×2 IMPLANT
DRAPE U-SHAPE 47X51 STRL (DRAPES) ×2 IMPLANT
DRSG ADAPTIC 3X8 NADH LF (GAUZE/BANDAGES/DRESSINGS) ×2 IMPLANT
DRSG PAD ABDOMINAL 8X10 ST (GAUZE/BANDAGES/DRESSINGS) ×4 IMPLANT
DURAPREP 26ML APPLICATOR (WOUND CARE) ×2 IMPLANT
ELECT CAUTERY BLADE 6.4 (BLADE) ×2 IMPLANT
ELECT REM PT RETURN 9FT ADLT (ELECTROSURGICAL) ×2
ELECTRODE REM PT RTRN 9FT ADLT (ELECTROSURGICAL) ×1 IMPLANT
EVACUATOR 1/8 PVC DRAIN (DRAIN) ×2 IMPLANT
FACESHIELD LNG OPTICON STERILE (SAFETY) ×2 IMPLANT
GLOVE BIO SURGEON STRL SZ7 (GLOVE) ×4 IMPLANT
GLOVE BIOGEL PI IND STRL 6.5 (GLOVE) ×2 IMPLANT
GLOVE BIOGEL PI IND STRL 7.0 (GLOVE) ×1 IMPLANT
GLOVE BIOGEL PI IND STRL 7.5 (GLOVE) ×1 IMPLANT
GLOVE BIOGEL PI INDICATOR 6.5 (GLOVE) ×2
GLOVE BIOGEL PI INDICATOR 7.0 (GLOVE) ×1
GLOVE BIOGEL PI INDICATOR 7.5 (GLOVE) ×1
GLOVE SS BIOGEL STRL SZ 7.5 (GLOVE) ×2 IMPLANT
GLOVE SUPERSENSE BIOGEL SZ 7.5 (GLOVE) ×2
GLOVE SURG SS PI 6.5 STRL IVOR (GLOVE) ×4 IMPLANT
GOWN PREVENTION PLUS XLARGE (GOWN DISPOSABLE) ×4 IMPLANT
GOWN STRL NON-REIN LRG LVL3 (GOWN DISPOSABLE) ×2 IMPLANT
HANDPIECE INTERPULSE COAX TIP (DISPOSABLE) ×1
HOOD PEEL AWAY FACE SHEILD DIS (HOOD) ×4 IMPLANT
IMMOBILIZER KNEE 22 UNIV (SOFTGOODS) ×2 IMPLANT
KIT BASIN OR (CUSTOM PROCEDURE TRAY) ×2 IMPLANT
KIT ROOM TURNOVER OR (KITS) ×2 IMPLANT
MANIFOLD NEPTUNE II (INSTRUMENTS) ×2 IMPLANT
NS IRRIG 1000ML POUR BTL (IV SOLUTION) ×2 IMPLANT
PACK TOTAL JOINT (CUSTOM PROCEDURE TRAY) ×2 IMPLANT
PAD ARMBOARD 7.5X6 YLW CONV (MISCELLANEOUS) ×4 IMPLANT
PAD CAST 4YDX4 CTTN HI CHSV (CAST SUPPLIES) ×1 IMPLANT
PADDING CAST COTTON 4X4 STRL (CAST SUPPLIES) ×1
PADDING CAST COTTON 6X4 STRL (CAST SUPPLIES) ×2 IMPLANT
POSITIONER HEAD PRONE TRACH (MISCELLANEOUS) ×2 IMPLANT
RUBBERBAND STERILE (MISCELLANEOUS) ×2 IMPLANT
SET HNDPC FAN SPRY TIP SCT (DISPOSABLE) ×1 IMPLANT
SPONGE GAUZE 4X4 12PLY (GAUZE/BANDAGES/DRESSINGS) ×2 IMPLANT
STRIP CLOSURE SKIN 1/2X4 (GAUZE/BANDAGES/DRESSINGS) ×2 IMPLANT
SUCTION FRAZIER TIP 10 FR DISP (SUCTIONS) ×2 IMPLANT
SUT ETHIBOND NAB CT1 #1 30IN (SUTURE) ×4 IMPLANT
SUT MNCRL AB 3-0 PS2 18 (SUTURE) ×2 IMPLANT
SUT VIC AB 0 CT1 27 (SUTURE) ×2
SUT VIC AB 0 CT1 27XBRD ANBCTR (SUTURE) ×2 IMPLANT
SUT VIC AB 2-0 CT1 27 (SUTURE) ×2
SUT VIC AB 2-0 CT1 TAPERPNT 27 (SUTURE) ×2 IMPLANT
SYR 30ML SLIP (SYRINGE) ×2 IMPLANT
TOWEL OR 17X24 6PK STRL BLUE (TOWEL DISPOSABLE) ×2 IMPLANT
TOWEL OR 17X26 10 PK STRL BLUE (TOWEL DISPOSABLE) ×2 IMPLANT
TRAY FOLEY CATH 16FR SILVER (SET/KITS/TRAYS/PACK) ×2 IMPLANT
WATER STERILE IRR 1000ML POUR (IV SOLUTION) ×4 IMPLANT

## 2013-07-02 NOTE — Anesthesia Postprocedure Evaluation (Signed)
  Anesthesia Post-op Note  Patient: Megan Horton  Procedure(s) Performed: Procedure(s): TOTAL KNEE ARTHROPLASTY- left (Left)  Patient Location: PACU  Anesthesia Type:GA combined with regional for post-op pain  Level of Consciousness: awake and alert   Airway and Oxygen Therapy: Patient Spontanous Breathing  Post-op Pain: mild  Post-op Assessment: Post-op Vital signs reviewed  Post-op Vital Signs: stable  Complications: No apparent anesthesia complications

## 2013-07-02 NOTE — Anesthesia Preprocedure Evaluation (Signed)
Anesthesia Evaluation  Patient identified by MRN, date of birth, ID band Patient awake    Reviewed: Allergy & Precautions, H&P , NPO status , Patient's Chart, lab work & pertinent test results, reviewed documented beta blocker date and time   History of Anesthesia Complications (+) Emergence Delirium  Airway Mallampati: II TM Distance: >3 FB Neck ROM: full    Dental   Pulmonary sleep apnea and Continuous Positive Airway Pressure Ventilation ,  breath sounds clear to auscultation        Cardiovascular hypertension, On Medications + Peripheral Vascular Disease Rhythm:regular     Neuro/Psych  Neuromuscular disease negative neurological ROS  negative psych ROS   GI/Hepatic Neg liver ROS, GERD-  Medicated and Controlled,  Endo/Other  diabetes, Oral Hypoglycemic AgentsHypothyroidism Morbid obesity  Renal/GU negative Renal ROS  negative genitourinary   Musculoskeletal   Abdominal   Peds  Hematology negative hematology ROS (+)   Anesthesia Other Findings See surgeon's H&P   Reproductive/Obstetrics negative OB ROS                           Anesthesia Physical Anesthesia Plan  ASA: III  Anesthesia Plan: General   Post-op Pain Management:    Induction: Intravenous  Airway Management Planned: Oral ETT  Additional Equipment:   Intra-op Plan:   Post-operative Plan: Extubation in OR  Informed Consent: I have reviewed the patients History and Physical, chart, labs and discussed the procedure including the risks, benefits and alternatives for the proposed anesthesia with the patient or authorized representative who has indicated his/her understanding and acceptance.   Dental Advisory Given  Plan Discussed with: CRNA and Surgeon  Anesthesia Plan Comments:         Anesthesia Quick Evaluation

## 2013-07-02 NOTE — Progress Notes (Signed)
07/02/13 2211  BiPAP/CPAP/SIPAP  BiPAP/CPAP/SIPAP Pt Type Adult  Mask Type Nasal mask  Mask Size Medium  IPAP (Max =20)  EPAP (Min = 5)  Oxygen Percent 28 %  Flow Rate 2 lpm  BiPAP/CPAP/SIPAP CPAP  Patient Home Equipment No (Except for the mask and tubing)  Auto Titrate Yes  BiPAP/CPAP /SiPAP Vitals  Pulse Rate 62  Resp 18  SpO2 98 %  Patient placed on CPAP Auto Mode with max IPAP 20 and  Min EPAP 5. She is using her home tubing  And home mask. 2L oxygen is bled in for comfort and hypoxemia. She tolerates the CPAP very well at this time. RT will continue to monitor.

## 2013-07-02 NOTE — Anesthesia Procedure Notes (Signed)
Anesthesia Regional Block:  Femoral nerve block  Pre-Anesthetic Checklist: ,, timeout performed, Correct Patient, Correct Site, Correct Laterality, Correct Procedure, Correct Position, site marked, Risks and benefits discussed,  Surgical consent,  Pre-op evaluation,  At surgeon's request and post-op pain management  Laterality: Left  Prep: chloraprep       Needles:   Needle Type: Other     Needle Length: 9cm  Needle Gauge: 21    Additional Needles:  Procedures: ultrasound guided (picture in chart) Femoral nerve block Narrative:  Start time: 07/02/2013 10:06 AM End time: 07/02/2013 10:13 AM Injection made incrementally with aspirations every 5 mL.  Performed by: Personally  Anesthesiologist: C. Lacy Taglieri MD  Additional Notes: Ultrasound guidance used to: id relevant anatomy, confirm needle position, local anesthetic spread, avoidance of vascular puncture. Picture saved. No complications. Block performed personally by Janetta Hora. Gelene Mink, MD    Femoral nerve block

## 2013-07-02 NOTE — Preoperative (Signed)
Beta Blockers   Reason not to administer Beta Blockers:Not Applicable 

## 2013-07-02 NOTE — Transfer of Care (Signed)
Immediate Anesthesia Transfer of Care Note  Patient: Megan Horton  Procedure(s) Performed: Procedure(s): TOTAL KNEE ARTHROPLASTY- left (Left)  Patient Location: PACU  Anesthesia Type:General  Level of Consciousness: awake and patient cooperative  Airway & Oxygen Therapy: Patient Spontanous Breathing and Patient connected to nasal cannula oxygen  Post-op Assessment: Report given to PACU RN, Post -op Vital signs reviewed and stable and Patient moving all extremities X 4  Post vital signs: Reviewed and stable  Complications: No apparent anesthesia complications

## 2013-07-02 NOTE — Interval H&P Note (Signed)
History and Physical Interval Note:  07/02/2013 11:10 AM  Megan Horton  has presented today for surgery, with the diagnosis of DJD LEFT KNEE  The various methods of treatment have been discussed with the patient and family. After consideration of risks, benefits and other options for treatment, the patient has consented to  Procedure(s): TOTAL KNEE ARTHROPLASTY- left (Left) as a surgical intervention .  The patient's history has been reviewed, patient examined, no change in status, stable for surgery.  I have reviewed the patient's chart and labs.  Questions were answered to the patient's satisfaction.     Salvatore Marvel A

## 2013-07-02 NOTE — H&P (View-Only) (Signed)
TOTAL KNEE ADMISSION H&P  Patient is being admitted for left total knee arthroplasty.  Subjective:  Chief Complaint:left knee pain.  HPI: Megan Horton, 72 y.o. female, has a history of pain and functional disability in the left knee due to arthritis and has failed non-surgical conservative treatments for greater than 12 weeks to includeNSAID's and/or analgesics, corticosteriod injections, flexibility and strengthening excercises, supervised PT with diminished ADL's post treatment, use of assistive devices and activity modification.  Onset of symptoms was gradual, starting >10 years ago with gradually worsening course since that time. The patient noted prior procedures on the knee to include  arthroscopy and menisectomy on the left knee(s).  Patient currently rates pain in the left knee(s) at 10 out of 10 with activity. Patient has night pain, worsening of pain with activity and weight bearing, pain that interferes with activities of daily living, crepitus and joint swelling.  Patient has evidence of subchondral sclerosis, periarticular osteophytes and joint space narrowing by imaging studies. There is no active infection.  Patient Active Problem List   Diagnosis Date Noted  . Left knee DJD   . Hypertension   . Peripheral vascular disease   . Peripheral neuropathy   . Seasonal allergies   . Diabetes mellitus   . Hypothyroidism   . GERD (gastroesophageal reflux disease)   . Arthritis   . Sleep apnea   . Complication of anesthesia   . Adhesive capsulitis of right shoulder   . Partial tear of rotator cuff    Past Medical History  Diagnosis Date  . Hypertension   . Peripheral vascular disease   . Peripheral neuropathy   . Seasonal allergies   . Diabetes mellitus   . Hypothyroidism   . GERD (gastroesophageal reflux disease)   . Arthritis   . Sleep apnea     CPAP, sleep study 3-4 years ago in [place], [place] Dr. Majiar   . Wears glasses   . Adhesive capsulitis of right shoulder    . Partial tear of rotator cuff(726.13)   . Complication of anesthesia 5/13    did have some disorientation post cerv fusion due to low sats  . Left knee DJD     Past Surgical History  Procedure Laterality Date  . Abdominal hysterectomy    . Cholecystectomy  2009  . Hemorrhoid surgery  2009  . Knee arthroscopy      Left x2  . Carpal tunnel release      bilat  . Cervical fusion    . Tonsillectomy    . Varicose vein surgery      Right  . Heel spur excision      bilat  . Nose surgery    . Anterior cervical decomp/discectomy fusion  03/10/2012    Procedure: ANTERIOR CERVICAL DECOMPRESSION/DISCECTOMY FUSION 1 LEVEL;  Surgeon: David S Jones, MD;  Location: [facility];  Service: Neurosurgery;  Laterality: N/A;  Cervical five-six anterior cervical decompression fusion with plating  . Eye surgery  2012    bilat cataract  . Shoulder arthroscopy  06/27/2012    Procedure: ARTHROSCOPY SHOULDER;  Surgeon: Robert A Wainer, MD;  Location: [facility];  Service: Orthopedics;  Laterality: Right;  right shoulder arthroscopy debridement extensive, distal clavulectomy, with resect adhesions with maniplulation     (Not in a hospital admission) Allergies  Allergen Reactions  . Adhesive [Tape] Other (See Comments)    REACTION: blister  . Tylenol [Acetaminophen] Nausea And Vomiting    History  Substance Use Topics  .   Smoking status: Former Smoker -- 0.25 packs/day for 16 years    Types: Cigarettes    Quit date: 12/26/1993  . Smokeless tobacco: Never Used  . Alcohol Use: No     Comment: very rarely wine    Family History  Problem Relation Age of Onset  . Heart disease Mother   . Heart disease Father   . Heart disease Brother   . Heart attack Father   . Heart attack Brother   . Heart attack Sister   . Hypertension Mother   . Hypertension Father   . Hypertension Sister   . Stroke Mother   . Stroke Brother      Review of Systems  Constitutional: Negative.   HENT:  Negative.   Eyes: Negative.   Respiratory: Positive for cough. Negative for hemoptysis, sputum production, shortness of breath and wheezing.   Cardiovascular: Negative.   Gastrointestinal: Negative.   Genitourinary: Negative.   Musculoskeletal: Positive for back pain and joint pain.  Skin: Negative.   Neurological: Negative.   Endo/Heme/Allergies: Negative.   Psychiatric/Behavioral: Negative.     Objective:  Physical Exam  Constitutional: She is oriented to person, place, and time. She appears well-developed and well-nourished.  HENT:  Head: Normocephalic and atraumatic.  Eyes: Conjunctivae and EOM are normal. Pupils are equal, round, and reactive to light.  Neck: Neck supple.  Cardiovascular: Normal rate, regular rhythm and normal heart sounds.   Respiratory: Effort normal. No respiratory distress. She has no wheezes. She has no rales.  Congested cough     Chronic sinus per patient  GI: Soft.  Genitourinary:  Not pertinent to current symptomatology therefore not examined.  Musculoskeletal:  Examination of her left knee reveals pain medially and laterally.  1+ synovitis.  1+ crepitation.  Range of motion -5 to 100 degrees.  Knee is stable with normal patella tracking and pain.  Examination of her right knee reveals full range of motion without pain, swelling, weakness or instability.  Lumbar exam reveals diffuse lumbar pain radiating into her left leg.  Neurologic exam: Straight leg raising positive on the left at 70 degrees, negative on the right.  Motor and sensory examination is within normal limits.  Neurological: She is alert and oriented to person, place, and time.  Skin: Skin is warm and dry.  Psychiatric: She has a normal mood and affect. Her behavior is normal.    Vit Last recorded: 08/13 1500   BP: 136/77 Pulse: 74  Temp: 98 F (36.7 C)    Height: 5' 5" (1.651 m) SpO2: 96  Weight: 107.956 kg (238 lb)   al signs in last 24 hours:   Labs:   Estimated body  mass index is 39.61 kg/(m^2) as calculated from the following:   Height as of this encounter: 5' 5" (1.651 m).   Weight as of this encounter: 107.956 kg (238 lb).   Imaging Review Plain radiographs demonstrate severe degenerative joint disease of the left knee(s). The overall alignment ismild varus. The bone quality appears to be good for age and reported activity level.  Assessment/Plan:  End stage arthritis, left knee   The patient history, physical examination, clinical judgment of the provider and imaging studies are consistent with end stage degenerative joint disease of the left knee(s) and total knee arthroplasty is deemed medically necessary. The treatment options including medical management, injection therapy arthroscopy and arthroplasty were discussed at length. The risks and benefits of total knee arthroplasty were presented and reviewed. The risks due to aseptic   loosening, infection, stiffness, patella tracking problems, thromboembolic complications and other imponderables were discussed. The patient acknowledged the explanation, agreed to proceed with the plan and consent was signed. Patient is being admitted for inpatient treatment for surgery, pain control, PT, OT, prophylactic antibiotics, VTE prophylaxis, progressive ambulation and ADL's and discharge planning. The patient is planning to be discharged to skilled nursing facility [facility]  Maddison Kilner A. Soley Harriss, PA-C Physician Assistant [facility] [phone]  06/20/2013, 3:28 PM   

## 2013-07-02 NOTE — Plan of Care (Signed)
Problem: Consults Goal: Diagnosis- Total Joint Replacement Primary Total Knee Left     

## 2013-07-02 NOTE — Op Note (Signed)
MRN:     [id number] DOB/AGE:    1941/10/22 / 72 y.o.       OPERATIVE REPORT    DATE OF PROCEDURE:  07/02/2013       PREOPERATIVE DIAGNOSIS:   DJD LEFT KNEE      Estimated body mass index is 40.66 kg/(m^2) as calculated from the following:   Height as of 06/25/13: 5\' 4"  (1.626 m).   Weight as of 06/27/12: 107.502 kg (237 lb).                                                        POSTOPERATIVE DIAGNOSIS:   DJD LEFT KNEE                                                                      PROCEDURE:  Procedure(s): TOTAL KNEE ARTHROPLASTY- left Using Depuy Sigma RP implants #3 Femur, #3Tibia, 15mm sigma RP bearing, 32 Patella     SURGEON: Latisha Lasch A    ASSISTANT:  Kirstin Shepperson PA-C   (Present and scrubbed throughout the case, critical for assistance with exposure, retraction, instrumentation, and closure.)         ANESTHESIA: GET with Femoral Nerve Block  DRAINS: foley, 2 medium hemovac in knee   TOURNIQUET TIME:   COMPLICATIONS:  None     SPECIMENS: None   INDICATIONS FOR PROCEDURE: The patient has  DJD LEFT KNEE, varus deformities, XR shows bone on bone arthritis. Patient has failed all conservative measures including anti-inflammatory medicines, narcotics, attempts at  exercise and weight loss, cortisone injections and viscosupplementation.  Risks and benefits of surgery have been discussed, questions answered.   DESCRIPTION OF PROCEDURE: The patient identified by armband, received  right femoral nerve block and IV antibiotics, in the holding area at [facility]. Patient taken to the operating room, appropriate anesthetic  monitors were attached General endotracheal anesthesia induced with  the patient in supine position, Foley catheter was inserted. Tourniquet  applied high to the operative thigh. Lateral post and foot positioner  applied to the table, the lower extremity was then prepped and draped  in usual sterile fashion from the ankle to the  tourniquet. Time-out procedure was performed. The limb was wrapped with an Esmarch bandage and the tourniquet inflated to 365 mmHg. We began the operation by making the anterior midline incision starting at handbreadth above the patella going over the patella 1 cm medial to and  4 cm distal to the tibial tubercle. Small bleeders in the skin and the  subcutaneous tissue identified and cauterized. Transverse retinaculum was incised and reflected medially and a medial parapatellar arthrotomy was accomplished. the patella was everted and theprepatellar fat pad resected. The superficial medial collateral  ligament was then elevated from anterior to posterior along the proximal  flare of the tibia and anterior half of the menisci resected. The knee was hyperflexed exposing bone on bone arthritis. Peripheral and notch osteophytes as well as the cruciate ligaments were then resected. We continued to  work our way around posteriorly along the proximal tibia, and externally  rotated the tibia subluxing it out from underneath the femur. A McHale  retractor was placed through the notch and a lateral Hohmann retractor  placed, and we then drilled through the proximal tibia in line with the  axis of the tibia followed by an intramedullary guide rod and 2-degree  posterior slope cutting guide. The tibial cutting guide was pinned into place  allowing resection of 6 mm of bone medially and about 8 mm of bone  laterally because of her varus deformity. Satisfied with the tibial resection, we then  entered the distal femur 2 mm anterior to the PCL origin with the  intramedullary guide rod and applied the distal femoral cutting guide  set at 11mm, with 5 degrees of valgus. This was pinned along the  epicondylar axis. At this point, the distal femoral cut was accomplished without difficulty. We then sized for a #3 femoral component and pinned the guide in 3 degrees of external rotation.The chamfer cutting guide was pinned  into place. The anterior, posterior, and chamfer cuts were accomplished without difficulty followed by  the Sigma RP box cutting guide and the box cut. We also removed posterior osteophytes from the posterior femoral condyles. At this  time, the knee was brought into full extension. We checked our  extension and flexion gaps and found them symmetric at 15mm.  The patella thickness measured at 21 mm. We set the cutting guide at 13 and removed the posterior 8 mm  of the patella sized for 32 button and drilled the lollipop. The knee  was then once again hyperflexed exposing the proximal tibia. We sized for a #3 tibial base plate, applied the smokestack and the conical reamer followed by the the Delta fin keel punch. We then hammered into place the Sigma RP trial femoral component, inserted a 15-mm trial bearing, trial patellar button, and took the knee through range of motion from 0-130 degrees. No thumb pressure was required for patellar  tracking. At this point, all trial components were removed, a double batch of DePuy HV cement  was mixed and applied to all bony metallic mating surfaces except for the posterior condyles of the femur itself. In order, we  hammered into place the tibial tray and removed excess cement, the femoral component and removed excess cement, a 15-mm Sigma RP bearing  was inserted, and the knee brought to full extension with compression.  The patellar button was clamped into place, and excess cement  removed. While the cement cured the wound was irrigated out with normal saline solution pulse lavage, and medium Hemovac drains were placed.. Ligament stability and patellar tracking were checked and found to be excellent. The tourniquet was then released and hemostasis was obtained with cautery. The parapatellar arthrotomy was closed with  #1 ethibond suture. The subcutaneous tissue with 0 and 2-0 undyed  Vicryl suture, and 4-0 Monocryl.. A dressing of Xeroform,  4 x 4, dressing  sponges, Webril, and Ace wrap applied. Needle and sponge count were correct times 2.The patient awakened, extubated, and taken to recovery room without difficulty. Vascular status was normal, pulses 2+ and symmetric.   Nicholos Aloisi A 07/02/2013, 12:41 PM

## 2013-07-02 NOTE — Progress Notes (Signed)
Orthopedic Tech Progress Note Patient Details:  Megan Horton [birth year] [id number]  CPM Left Knee CPM Left Knee: On Left Knee Flexion (Degrees): 60 Left Knee Extension (Degrees): 0 Additional Comments: ohf will be provided when one becomes available;rn notified   Nikki Dom 07/02/2013, 1:57 PM

## 2013-07-03 ENCOUNTER — Encounter (HOSPITAL_COMMUNITY): Payer: Self-pay | Admitting: Orthopedic Surgery

## 2013-07-03 LAB — GLUCOSE, CAPILLARY
Glucose-Capillary: 212 mg/dL — ABNORMAL HIGH (ref 70–99)
Glucose-Capillary: 221 mg/dL — ABNORMAL HIGH (ref 70–99)

## 2013-07-03 LAB — CBC
MCH: 29.9 pg (ref 26.0–34.0)
MCHC: 32.6 g/dL (ref 30.0–36.0)
MCV: 91.8 fL (ref 78.0–100.0)
Platelets: 134 10*3/uL — ABNORMAL LOW (ref 150–400)
RDW: 14.6 % (ref 11.5–15.5)

## 2013-07-03 LAB — BASIC METABOLIC PANEL
CO2: 28 mEq/L (ref 19–32)
Calcium: 8.6 mg/dL (ref 8.4–10.5)
Creatinine, Ser: 0.73 mg/dL (ref 0.50–1.10)
GFR calc Af Amer: >90 mL/min (ref >90)
GFR calc non Af Amer: 85 mL/min — ABNORMAL LOW (ref >90)
Sodium: 144 mEq/L (ref 135–145)

## 2013-07-03 LAB — HEMOGLOBIN A1C: Hgb A1c MFr Bld: 6.1 % — ABNORMAL HIGH (ref <5.7)

## 2013-07-03 MED ORDER — LIRAGLUTIDE 18 MG/3ML ~~LOC~~ SOPN
1.2000 mg | PEN_INJECTOR | Freq: Every day | SUBCUTANEOUS | Status: DC
  Administered 2013-07-03 – 2013-07-04 (×2): 1.2 mg via SUBCUTANEOUS

## 2013-07-03 MED ORDER — SODIUM CHLORIDE 0.9 % IV BOLUS (SEPSIS)
500.0000 mL | Freq: Once | INTRAVENOUS | Status: AC
  Administered 2013-07-03: 500 mL via INTRAVENOUS

## 2013-07-03 NOTE — Progress Notes (Signed)
Physical Therapy Evaluation Patient Details Name: Megan Horton MRN: [id number] DOB: [birth year] Today's Date: 07/03/2013 Time: 1020-1054 PT Time Calculation (min): 34 min  PT Assessment / Plan / Recommendation History of Present Illness  s/p LTKA; pt lives alone  Clinical Impression  Pt is s/p TKA resulting in the deficits listed below (see PT Problem List).  Pt will benefit from skilled PT to increase their independence and safety with mobility to allow discharge to the venue listed below.       PT Assessment  Patient needs continued PT services    Follow Up Recommendations  SNF    Does the patient have the potential to tolerate intense rehabilitation      Barriers to Discharge Decreased caregiver support Plans for short-term SNF stay for rehab to maximize independence and safety prior to dc home alone    Equipment Recommendations  Rolling walker with 5" wheels;3in1 (PT)    Recommendations for Other Services     Frequency 7X/week    Precautions / Restrictions Precautions Precautions: Knee Required Braces or Orthoses: Knee Immobilizer - Left Knee Immobilizer - Left: Discontinue once straight leg raise with < 10 degree lag Restrictions LLE Weight Bearing: Weight bearing as tolerated   Pertinent Vitals/Pain Pain Left knee; Pt did not rate Positioned in recliner for optimal L knee extension      Mobility  Bed Mobility Bed Mobility: Supine to Sit;Sitting - Scoot to Edge of Bed Supine to Sit: 4: Min guard Sitting - Scoot to [place] of Bed: 4: Min guard Details for Bed Mobility Assistance: Overall smooth transition; Used KI Transfers Transfers: Sit to Stand;Stand to [employer] to Stand: 4: Min guard;From bed Stand to Sit: 4: Min guard;With armrests;To chair/3-in-1 Details for Transfer Assistance: Cues for hand placement and safety Ambulation/Gait Ambulation/Gait Assistance: 4: Min guard Ambulation Distance (Feet): 75 Feet Assistive device: Rolling walker Ambulation/Gait  Assistance Details: Cues for gait sequence and to activate quad for L stance stability Gait Pattern: Step-to pattern    Exercises Total Joint Exercises Quad Sets: AROM;Left;10 reps Straight Leg Raises: AROM;Left;5 reps   PT Diagnosis: Difficulty walking;Acute pain  PT Problem List: Decreased strength;Decreased activity tolerance;Decreased mobility;Decreased knowledge of use of DME;Decreased knowledge of precautions;Pain PT Treatment Interventions: DME instruction;Gait training;Stair training;Functional mobility training;Therapeutic activities;Therapeutic exercise;Patient/family education     PT Goals(Current goals can be found in the care plan section) Acute Rehab PT Goals Patient Stated Goal: Get back to being independent PT Goal Formulation: With patient Time For Goal Achievement: 07/10/13 Potential to Achieve Goals: Good  Visit Information  Last PT Received On: 07/03/13 Assistance Needed: +1 History of Present Illness: s/p LTKA; pt lives alone       Prior Functioning  Home Living Family/patient expects to be discharged to:: Skilled nursing facility [facility]) Living Arrangements: Alone Prior Function Level of Independence: Independent 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 Communication Communication: No difficulties    Cognition  Cognition Arousal/Alertness: Awake/alert Behavior During Therapy: WFL for tasks assessed/performed Overall Cognitive Status: Within Functional Limits for tasks assessed    Extremity/Trunk Assessment Upper Extremity Assessment Upper Extremity Assessment: Overall WFL for tasks assessed Lower Extremity Assessment Lower Extremity Assessment: LLE deficits/detail LLE Deficits / Details: Excellent Quad set and straight leg raise with minimal lag   Balance    End of Session PT - End of Session Equipment Utilized During Treatment: Gait belt;Left knee immobilizer Activity Tolerance: Patient tolerated treatment well Patient left: in  chair;with call bell/phone within reach Nurse Communication: Mobility  status  GP     Van Clines [facility] [place], [place] [phone]  07/03/2013, 12:49 PM

## 2013-07-03 NOTE — Progress Notes (Signed)
Subjective: 1 Day Post-Op Procedure(s) (LRB): TOTAL KNEE ARTHROPLASTY- left (Left) Patient reports pain as 5 on 0-10 scale.    Objective: Vital signs in last 24 hours: Temp:  [97.6 F (36.4 C)-98.8 F (37.1 C)] 98.8 F (37.1 C) (08/26 0622) Pulse Rate:  [49-75] 65 (08/26 0622) Resp:  [8-24] 16 (08/26 0800) BP: (131-159)/(44-74) 143/71 mmHg (08/26 0622) SpO2:  [91 %-100 %] 98 % (08/26 0800)  Intake/Output from previous day: 08/25 0701 - 08/26 0700 In: 1900 [I.V.:1900] Out: 2585 [Urine:2125; Drains:450; Blood:10] Intake/Output this shift: Total I/O In: 1078.3 [I.V.:1078.3] Out: -    Recent Labs  07/03/13 0530  HGB 12.0    Recent Labs  07/03/13 0530  WBC 9.3  RBC 4.01  HCT 36.8  PLT 134*    Recent Labs  07/03/13 0530  NA 144  K 4.7  CL 108  CO2 28  BUN 13  CREATININE 0.73  GLUCOSE 184*  CALCIUM 8.6   No results found for this basename: LABPT, INR,  in the last 72 hours  ABD soft Neurovascular intact Sensation intact distally Intact pulses distally Dorsiflexion/Plantar flexion intact Incision: scant drainage  Assessment/Plan: 1 Day Post-Op Procedure(s) (LRB): TOTAL KNEE ARTHROPLASTY- left (Left) Advance diet Up with therapy Discharge to SNF on Thursday   [facility] Resume [facility] tonight  Megan Horton J 07/03/2013, 8:59 AM

## 2013-07-03 NOTE — Progress Notes (Signed)
UR COMPLETED  

## 2013-07-03 NOTE — Progress Notes (Signed)
Pt. States she can place cpap on herself. RT informed pt. To notify if she needs any assistance. 

## 2013-07-03 NOTE — Progress Notes (Signed)
Clinical Social Work Department CLINICAL SOCIAL WORK PLACEMENT NOTE 07/03/2013  Patient:  Megan Horton,Megan Horton  Account Number:  [id number] Admit date:  07/02/2013  Clinical Social Worker:  Theresia Bough, Theresia Majors  Date/time:  07/03/2013 03:14 PM  Clinical Social Work is seeking post-discharge placement for this patient at the following level of care:   SKILLED NURSING   (*CSW will update this form in Epic as items are completed)   07/03/2013  Patient/family provided with [facility] Department of Clinical Social Work's list of facilities offering this level of care within the geographic area requested by the patient (or if unable, by the patient's family).  07/03/2013  Patient/family informed of their freedom to choose among providers that offer the needed level of care, that participate in Medicare, Medicaid or managed care program needed by the patient, have an available bed and are willing to accept the patient.  07/03/2013  Patient/family informed of [facility]' ownership interest in [facility], as well as of the fact that they are under no obligation to receive care at this facility.  PASARR submitted to EDS on 07/03/2013 PASARR number received from EDS on 07/03/2013  FL2 transmitted to all facilities in geographic area requested by pt/family on  07/03/2013 FL2 transmitted to all facilities within larger geographic area on   Patient informed that his/her managed care company has contracts with or will negotiate with  certain facilities, including the following:     Patient/family informed of bed offers received:   Patient chooses bed at  Physician recommends and patient chooses bed at    Patient to be transferred to  on   Patient to be transferred to facility by   The following physician request were entered in Epic:   Additional Comments:  Theresia Bough, MSW, LCSW [phone]

## 2013-07-03 NOTE — Progress Notes (Signed)
Physical Therapy Note  Very good knee control/extension with quad setting and straight leg raises; Will work without KI next session  Pain 8/10 post therex; RN informed   07/03/13 1400  PT Visit Information  Last PT Received On 07/03/13  Assistance Needed +1  History of Present Illness s/p LTKA; pt lives alone  PT Time Calculation  PT Start Time 1314  PT Stop Time 1343  PT Time Calculation (min) 29 min  Subjective Data  Subjective Very tired this afternoon; chose to focus on therex  Patient Stated Goal Get back to being independent  Precautions  Precautions Knee  Required Braces or Orthoses Knee Immobilizer - Left  Knee Immobilizer - Left Discontinue once straight leg raise with < 10 degree lag (may be able to dc next session)  Restrictions  LLE Weight Bearing WBAT  Cognition  Arousal/Alertness Awake/alert  Behavior During Therapy WFL for tasks assessed/performed  Overall Cognitive Status Within Functional Limits for tasks assessed  Bed Mobility  Bed Mobility Sit to Supine  Sit to Supine 4: Min guard  Details for Bed Mobility Assistance Overall smooth transition; Used KI  Transfers  Transfers Sit to Stand;Stand to Sit  Sit to Stand From bed;5: Supervision  Stand to Sit 4: Min guard;With armrests;To chair/3-in-1  Details for Transfer Assistance Cues for hand placement and safety  Ambulation/Gait  Ambulation/Gait Assistance 4: Min guard  Ambulation Distance (Feet) 15 Feet  Assistive device Rolling walker  Ambulation/Gait Assistance Details Cues for quad activation for L stance stability  Gait Pattern Step-to pattern  Exercises  Exercises Total Joint  Total Joint Exercises  Quad Sets AROM;Left;10 reps  Straight Leg Raises AROM;Left;10 reps  Ankle Circles/Pumps AROM;Both;10 reps  Short Arc Quad AROM;Left;10 reps  Heel Slides AAROM;Left;10 reps  Hip ABduction/ADduction AROM;Left;10 reps  Goniometric ROM approx 0-45deg, with flexion limited by pain  PT - End of  Session  Equipment Utilized During Treatment Gait belt;Left knee immobilizer  Activity Tolerance Patient tolerated treatment well  Patient left with call bell/phone within reach;in bed;in CPM  Nurse Communication Mobility status  PT - Assessment/Plan  PT Plan Current plan remains appropriate  PT Frequency 7X/week  Follow Up Recommendations SNF  PT equipment Rolling walker with 5" wheels;3in1 (PT)  PT Goal Progression  Progress towards PT goals Progressing toward goals  Acute Rehab PT Goals  PT Goal Formulation With patient  Time For Goal Achievement 07/10/13  Potential to Achieve Goals Good  PT General Charges  $$ ACUTE PT VISIT 1 Procedure  PT Treatments  $Gait Training 8-22 mins  $Therapeutic Exercise 8-22 mins  [place], [place] [phone]

## 2013-07-03 NOTE — Progress Notes (Signed)
Clinical Social Work Department BRIEF PSYCHOSOCIAL ASSESSMENT 07/03/2013  Patient:  Megan Horton,Megan Horton     Account Number:  [id number]     Admit date:  07/02/2013  Clinical Social Worker:  Lourdes Sledge  Date/Time:  07/03/2013 03:02 PM  Referred by:  Physician  Date Referred:  07/03/2013 Referred for  SNF Placement   Other Referral:   Interview type:  Patient Other interview type:    PSYCHOSOCIAL DATA Living Status:  ALONE Admitted from facility:   Level of care:   Primary support name:  Liona Wengert [phone] Primary support relationship to patient:  CHILD, ADULT Degree of support available:   Pt states she lives alone and does not have 24hr care.    CURRENT CONCERNS Current Concerns  Post-Acute Placement   Other Concerns:    SOCIAL WORK ASSESSMENT / PLAN CSW informed that pt has pre-registered at [facility].    CSW visited pt room and introduced herself and role. Pt stated she has pre-registered at [facility] and would like to go there at dc. CSW received to do a SNF search for [facility] as well in the instance that [facility] did not have any beds available.    CSW awaiting to hear whether [facility] can offer placement.   Assessment/plan status:  Psychosocial Support/Ongoing Assessment of Needs Other assessment/ plan:   Information/referral to community resources:   CSW provided a SNF list for pt.    PATIENT'S/FAMILY'S RESPONSE TO PLAN OF CARE: Pt laying in bed alert and oriented. Pt is agreeable to SNF placement.       Theresia Bough, MSW, LCSW [phone]

## 2013-07-04 ENCOUNTER — Encounter (HOSPITAL_COMMUNITY): Payer: Self-pay | Admitting: General Practice

## 2013-07-04 LAB — CBC
Hemoglobin: 10.7 g/dL — ABNORMAL LOW (ref 12.0–15.0)
MCHC: 32.9 g/dL (ref 30.0–36.0)
Platelets: 125 10*3/uL — ABNORMAL LOW (ref 150–400)
RDW: 14.3 % (ref 11.5–15.5)

## 2013-07-04 LAB — GLUCOSE, CAPILLARY
Glucose-Capillary: 170 mg/dL — ABNORMAL HIGH (ref 70–99)
Glucose-Capillary: 132 mg/dL — ABNORMAL HIGH (ref 70–99)

## 2013-07-04 LAB — BASIC METABOLIC PANEL
GFR calc Af Amer: >90 mL/min (ref >90)
GFR calc non Af Amer: 84 mL/min — ABNORMAL LOW (ref >90)
Glucose, Bld: 174 mg/dL — ABNORMAL HIGH (ref 70–99)
Potassium: 4.6 mEq/L (ref 3.5–5.1)
Sodium: 141 mEq/L (ref 135–145)

## 2013-07-04 NOTE — Progress Notes (Signed)
Orthopedic Tech Progress Note Patient Details:  Megan Horton 12-30-1940 [id number] On cpm at 8:05 pm LLE 0-55 Patient ID: [name], female   DOB: 1941/09/30, 72 y.o.   MRN: [id number]   Megan Horton 07/04/2013, 8:06 PM

## 2013-07-04 NOTE — Progress Notes (Signed)
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   PT Comments   Good progress, especially with L stance stability during gait without KI  Follow Up Recommendations  SNF     Does the patient have the potential to tolerate intense rehabilitation     Barriers to Discharge        Equipment Recommendations  Rolling walker with 5" wheels;3in1 (PT)    Recommendations for Other Services    Frequency 7X/week   Progress towards PT Goals Progress towards PT goals: Progressing toward goals  Plan Current plan remains appropriate    Precautions / Restrictions Precautions Precautions: Knee Precaution Comments: educated on precautions Required Braces or Orthoses: Knee Immobilizer - Left Knee Immobilizer - Left: Discontinue once straight leg raise with < 10 degree lag (dc'd) Restrictions LLE Weight Bearing: Weight bearing as tolerated   Pertinent Vitals/Pain 6/10 L knee patient repositioned for comfort and for optimal knee ext    Mobility  Transfers Transfers: Sit to Stand;Stand to Sit Sit to Stand: From bed;5: Supervision Stand to Sit: 4: Min guard;With armrests;To chair/3-in-1 Details for Transfer Assistance: Cues for hand placement and safety; Pretty good knee flexion with stand to sit Ambulation/Gait Ambulation/Gait Assistance: 4: Min guard Ambulation Distance (Feet): 120 Feet Assistive device: Rolling walker Ambulation/Gait Assistance Details: Overall quite good knee control in L stance; only a few small instances of buckling, and pt was able to steady self in RW; did not use KI Gait Pattern: Step-to pattern    Exercises Total Joint Exercises Long Arc Quad: AROM;AAROM;Left;10 reps Knee Flexion: AROM;Left;10 reps;Seated   PT Diagnosis:    PT Problem List:   PT  Treatment Interventions:     PT Goals (current goals can now be found in the care plan section) Acute Rehab PT Goals Patient Stated Goal: Get back to being independent PT Goal Formulation: With patient Time For Goal Achievement: 07/10/13 Potential to Achieve Goals: Good  Visit Information  Last PT Received On: 07/04/13 Assistance Needed: +1 History of Present Illness: s/p LTKA; pt lives alone    Subjective Data  Subjective: Agreeable to try amb without KI Patient Stated Goal: Get back to being independent   Cognition  Cognition Arousal/Alertness: Awake/alert Behavior During Therapy: WFL for tasks assessed/performed Overall Cognitive Status: Within Functional Limits for tasks assessed    Balance     End of Session PT - End of Session Equipment Utilized During Treatment: Gait belt Activity Tolerance: Patient tolerated treatment well Patient left: with call bell/phone within reach;in chair Nurse Communication: Mobility status CPM Left Knee CPM Left Knee: Off   GP     Megan Horton Megan Horton, Megan Horton [phone]  07/04/2013, 12:54 PM

## 2013-07-04 NOTE — Evaluation (Addendum)
Occupational Therapy Evaluation Patient Details Name: Megan Horton MRN: [id number] DOB: January 06, 1941 Today's Date: 07/04/2013 Time: 4098-1191 OT Time Calculation (min): 24 min  OT Assessment / Plan / Recommendation History of present illness s/p LTKA; pt lives alone   Clinical Impression   Pt is s/p TKA resulting in deficits listed below (see OT problem list). Pt will benefit from skilled OT to increase independence prior to d/c. Pt was independent with ADLs, PTA. Recommending SNF for d/c as pt lives alone.     OT Assessment  Patient needs continued OT Services    Follow Up Recommendations  SNF;Supervision/Assistance - 24 hour    Barriers to Discharge Decreased caregiver support    Equipment Recommendations  None recommended by OT    Recommendations for Other Services    Frequency  Min 2X/week    Precautions / Restrictions Precautions Precautions: Knee Precaution Comments: educated on precautions Required Braces or Orthoses: Knee Immobilizer - Left Knee Immobilizer - Left: Discontinue once straight leg raise with < 10 degree lag Restrictions Weight Bearing Restrictions: Yes LLE Weight Bearing: Weight bearing as tolerated   Pertinent Vitals/Pain Pain 6/10. Placed in CPM in bed at end of session.     ADL  Grooming: Performed;Wash/dry hands;Teeth care;Supervision/safety (supplies already at sink) Where Assessed - Grooming: Supported standing Lower Body Bathing: Minimal assistance Where Assessed - Lower Body Bathing: Supported sit to stand Lower Body Dressing: Minimal assistance Where Assessed - Lower Body Dressing: Supported sit to Pharmacist, hospital: Hydrographic surveyor Method: Sit to Barista: Raised toilet seat with arms (or 3-in-1 over toilet) Toileting - Clothing Manipulation and Hygiene: Min guard Where Assessed - Toileting Clothing Manipulation and Hygiene: Sit to stand from 3-in-1 or toilet Tub/Shower Transfer Method: Not  assessed Equipment Used: Knee Immobilizer;Rolling walker;Long-handled sponge;Long-handled shoe horn;Reacher;Sock aid Transfers/Ambulation Related to ADLs: Min guard for ambulation. Min guard/supervision for transfers. ADL Comments: Pt ambulated to bathroom to perform toileting and grooming tasks. Pt unable to don/doff left sock, but able to don/doff right. Pt also donned shorts- overall Min A level for LB ADLs. OT showed pt AE to assist with LB ADLs. Pt verbalized she also has a bad back. OT educated to stand in front of chair/bed with walker in front when standing to pull up pants/underwear.     OT Diagnosis: Acute pain  OT Problem List: Decreased strength;Decreased activity tolerance;Decreased range of motion;Decreased knowledge of use of DME or AE;Decreased knowledge of precautions;Pain OT Treatment Interventions: Self-care/ADL training;DME and/or AE instruction;Therapeutic activities;Patient/family education;Balance training   OT Goals(Current goals can be found in the care plan section) Acute Rehab OT Goals Patient Stated Goal: get back to working in my yard OT Goal Formulation: With patient Time For Goal Achievement: 07/11/13 Potential to Achieve Goals: Good ADL Goals Pt Will Perform Lower Body Bathing: with modified independence;with adaptive equipment;sit to/from stand Pt Will Perform Lower Body Dressing: with modified independence;with adaptive equipment;sit to/from stand Pt Will Transfer to Toilet: with modified independence;ambulating;grab bars (comfort height toilet) Pt Will Perform Toileting - Clothing Manipulation and hygiene: with modified independence;sit to/from stand  Visit Information  Last OT Received On: 07/04/13 Assistance Needed: +1 History of Present Illness: s/p LTKA; pt lives alone       Prior Functioning     Home Living Family/patient expects to be discharged to:: Skilled nursing facility [facility]) Living Arrangements: Alone Additional Comments: has  shower chair and elevated commode with grab bar Prior Function Level of Independence: Independent 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 Communication Communication: No difficulties         Vision/Perception     Cognition  Cognition Arousal/Alertness: Awake/alert Behavior During Therapy: WFL for tasks assessed/performed Overall Cognitive Status: Within Functional Limits for tasks assessed    Extremity/Trunk Assessment Upper Extremity Assessment Upper Extremity Assessment: Overall WFL for tasks assessed (reports previous surgery on Rt shoulder)     Mobility Bed Mobility Bed Mobility: Sit to Supine Sit to Supine: 5: Supervision;HOB flat Transfers Transfers: Sit to Stand;Stand to Sit Sit to Stand: 4: Min guard;With upper extremity assist;From chair/3-in-1 Stand to Sit: 4: Min guard;5: Supervision;With upper extremity assist;To chair/3-in-1;To bed Details for Transfer Assistance: Supervision for stand to sit to 3 in 1, otherwise Min guard level for transfers. Educated to make sure walker is close when standing from 3 in 1 and also to be sure leg is touching before sitting during transfers.      Exercise     Balance     End of Session OT - End of Session Equipment Utilized During Treatment: Rolling walker;Left knee immobilizer Activity Tolerance: Patient tolerated treatment well Patient left: in bed;with call bell/phone within reach;in CPM CPM Left Knee CPM Left Knee: On Left Knee Flexion (Degrees): 45 Left Knee Extension (Degrees): -1  GO     Earlie Raveling OTR/L [phone] 07/04/2013, 9:22 AM

## 2013-07-04 NOTE — Progress Notes (Signed)
Physical Therapy Treatment Patient Details Name: Megan Horton MRN: [id number] DOB: 1941-07-24 Today's Date: 07/04/2013 Time: 4098-1191 PT Time Calculation (min): 15 min  PT Assessment / Plan / Recommendation  History of Present Illness s/p LTKA; pt lives alone   PT Comments    Showing excellent knee control with therex  Follow Up Recommendations  SNF     Does the patient have the potential to tolerate intense rehabilitation     Barriers to Discharge        Equipment Recommendations  Rolling walker with 5" wheels;3in1 (PT)    Recommendations for Other Services    Frequency 7X/week   Progress towards PT Goals Progress towards PT goals: Progressing toward goals  Plan Current plan remains appropriate    Precautions / Restrictions Precautions Precautions: Knee Precaution Comments: educated on precautions Required Braces or Orthoses: Knee Immobilizer - Left Knee Immobilizer - Left: Discontinue once straight leg raise with < 10 degree lag Restrictions LLE Weight Bearing: Weight bearing as tolerated   Pertinent Vitals/Pain 7/10 pain with heel slides Positioned as comfortably as possible in CPM    Mobility  Transfers Transfers: Sit to Stand;Stand to Sit Sit to Stand: From bed;5: Supervision Stand to Sit: 4: Min guard;With armrests;To chair/3-in-1 Details for Transfer Assistance: Cues for hand placement and safety; Pretty good knee flexion with stand to sit Ambulation/Gait Ambulation/Gait Assistance: 4: Min guard Ambulation Distance (Feet): 120 Feet Assistive device: Rolling walker Ambulation/Gait Assistance Details: Overall quite good knee control in L stance; only a few small instances of buckling, and pt was able to steady self in RW; did not use KI Gait Pattern: Step-to pattern    Exercises Total Joint Exercises Ankle Circles/Pumps: AROM;Both;10 reps Quad Sets: AROM;Left;10 reps Short Arc Quad: AROM;Left;10 reps Heel Slides: AAROM;Left;10 reps Straight Leg Raises:  AROM;Left;10 reps Long Arc Quad: AROM;AAROM;Left;10 reps Knee Flexion: AROM;Left;10 reps;Seated   PT Diagnosis:    PT Problem List:   PT Treatment Interventions:     PT Goals (current goals can now be found in the care plan section) Acute Rehab PT Goals Patient Stated Goal: Get back to being independent PT Goal Formulation: With patient Time For Goal Achievement: 07/10/13 Potential to Achieve Goals: Good  Visit Information  Last PT Received On: 07/04/13 Assistance Needed: +1 History of Present Illness: s/p LTKA; pt lives alone    Subjective Data  Subjective: Would like to focus on therex Patient Stated Goal: Get back to being independent   Cognition  Cognition Arousal/Alertness: Awake/alert Behavior During Therapy: WFL for tasks assessed/performed Overall Cognitive Status: Within Functional Limits for tasks assessed    Balance     End of Session PT - End of Session Equipment Utilized During Treatment: Gait belt Activity Tolerance: Patient tolerated treatment well Patient left: in bed;in CPM;with call bell/phone within reach Nurse Communication: Mobility status   GP     Olen [place], [place] [phone]  07/04/2013, 3:26 PM

## 2013-07-05 DIAGNOSIS — Z96659 Presence of unspecified artificial knee joint: Secondary | ICD-10-CM | POA: Diagnosis not present

## 2013-07-05 DIAGNOSIS — R269 Unspecified abnormalities of gait and mobility: Secondary | ICD-10-CM | POA: Diagnosis not present

## 2013-07-05 DIAGNOSIS — M1710 Unilateral primary osteoarthritis, unspecified knee: Secondary | ICD-10-CM | POA: Diagnosis not present

## 2013-07-05 DIAGNOSIS — G4730 Sleep apnea, unspecified: Secondary | ICD-10-CM | POA: Diagnosis not present

## 2013-07-05 DIAGNOSIS — D696 Thrombocytopenia, unspecified: Secondary | ICD-10-CM | POA: Diagnosis not present

## 2013-07-05 DIAGNOSIS — Z471 Aftercare following joint replacement surgery: Secondary | ICD-10-CM | POA: Diagnosis not present

## 2013-07-05 DIAGNOSIS — E119 Type 2 diabetes mellitus without complications: Secondary | ICD-10-CM | POA: Diagnosis not present

## 2013-07-05 DIAGNOSIS — K5900 Constipation, unspecified: Secondary | ICD-10-CM | POA: Diagnosis not present

## 2013-07-05 DIAGNOSIS — M6281 Muscle weakness (generalized): Secondary | ICD-10-CM | POA: Diagnosis not present

## 2013-07-05 DIAGNOSIS — E785 Hyperlipidemia, unspecified: Secondary | ICD-10-CM | POA: Diagnosis not present

## 2013-07-05 DIAGNOSIS — E039 Hypothyroidism, unspecified: Secondary | ICD-10-CM | POA: Diagnosis not present

## 2013-07-05 DIAGNOSIS — K219 Gastro-esophageal reflux disease without esophagitis: Secondary | ICD-10-CM | POA: Diagnosis not present

## 2013-07-05 DIAGNOSIS — D62 Acute posthemorrhagic anemia: Secondary | ICD-10-CM | POA: Diagnosis not present

## 2013-07-05 DIAGNOSIS — I10 Essential (primary) hypertension: Secondary | ICD-10-CM | POA: Diagnosis not present

## 2013-07-05 DIAGNOSIS — I739 Peripheral vascular disease, unspecified: Secondary | ICD-10-CM | POA: Diagnosis not present

## 2013-07-05 DIAGNOSIS — IMO0002 Reserved for concepts with insufficient information to code with codable children: Secondary | ICD-10-CM | POA: Diagnosis not present

## 2013-07-05 DIAGNOSIS — M1990 Unspecified osteoarthritis, unspecified site: Secondary | ICD-10-CM | POA: Diagnosis not present

## 2013-07-05 LAB — CBC
HCT: 35.6 % — ABNORMAL LOW (ref 36.0–46.0)
Hemoglobin: 11.8 g/dL — ABNORMAL LOW (ref 12.0–15.0)
MCH: 30.3 pg (ref 26.0–34.0)
MCV: 91.5 fL (ref 78.0–100.0)
RBC: 3.89 MIL/uL (ref 3.87–5.11)

## 2013-07-05 LAB — BASIC METABOLIC PANEL
CO2: 25 mEq/L (ref 19–32)
Calcium: 8.8 mg/dL (ref 8.4–10.5)
Chloride: 108 mEq/L (ref 96–112)
Creatinine, Ser: 0.71 mg/dL (ref 0.50–1.10)
Glucose, Bld: 128 mg/dL — ABNORMAL HIGH (ref 70–99)

## 2013-07-05 LAB — GLUCOSE, CAPILLARY
Glucose-Capillary: 117 mg/dL — ABNORMAL HIGH (ref 70–99)
Glucose-Capillary: 103 mg/dL — ABNORMAL HIGH (ref 70–99)

## 2013-07-05 MED ORDER — ACETAMINOPHEN 325 MG PO TABS: 650.0000 mg | ORAL_TABLET | Freq: Four times a day (QID) | ORAL | Status: DC | PRN

## 2013-07-05 MED ORDER — CELECOXIB 200 MG PO CAPS: 200.0000 mg | ORAL_CAPSULE | Freq: Every day | ORAL | Status: DC

## 2013-07-05 MED ORDER — BISACODYL 5 MG PO TBEC: DELAYED_RELEASE_TABLET | ORAL | Status: DC

## 2013-07-05 MED ORDER — OXYCODONE HCL 5 MG PO TABS: ORAL_TABLET | ORAL | Status: DC

## 2013-07-05 MED ORDER — BISACODYL 10 MG RE SUPP
10.0000 mg | Freq: Once | RECTAL | Status: AC
  Administered 2013-07-05: 10 mg via RECTAL
  Filled 2013-07-05: qty 1

## 2013-07-05 MED ORDER — ASPIRIN 325 MG PO TBEC: DELAYED_RELEASE_TABLET | ORAL | Status: DC

## 2013-07-05 MED ORDER — DSS 100 MG PO CAPS: ORAL_CAPSULE | ORAL | Status: DC

## 2013-07-05 NOTE — Discharge Summary (Signed)
Patient ID: Megan Horton MRN: [id number] DOB/AGE: [birth year] 72 y.o.  Admit date: 07/02/2013 Discharge date: 07/05/2013  Admission Diagnoses:  Principal Problem:   Left knee DJD Active Problems:   Hypertension   Peripheral vascular disease   Peripheral neuropathy   Diabetes mellitus   Hypothyroidism   GERD (gastroesophageal reflux disease)   Arthritis   Sleep apnea   Complication of anesthesia   Partial tear of rotator cuff(726.13)     Discharge Diagnoses:  Principal Problem:   Left knee DJD Active Problems:   Hypertension   Peripheral vascular disease   Peripheral neuropathy   Diabetes mellitus   Hypothyroidism   GERD (gastroesophageal reflux disease)   Arthritis   Sleep apnea   Complication of anesthesia   Partial tear of rotator cuff(726.13)   Thrombocytopenia, unspecified   Past Medical History  Diagnosis Date  . Hypertension   . Peripheral vascular disease   . Peripheral neuropathy   . Seasonal allergies   . Diabetes mellitus   . Hypothyroidism   . GERD (gastroesophageal reflux disease)   . Arthritis   . Sleep apnea     CPAP, sleep study 3-4 years ago in [place], [place] Dr. Horace Porteous   . Wears glasses   . Adhesive capsulitis of right shoulder   . Partial tear of rotator cuff(726.13)   . Complication of anesthesia 5/13    did have some disorientation post cerv fusion due to low sats  . Left knee DJD   . Seasonal allergies   . Urgency of urination   . Hyperlipemia     Surgeries: Procedure(s): TOTAL KNEE ARTHROPLASTY- left on 07/02/2013   Consultants:    Discharged Condition: Improved  Hospital Course: Megan Horton is an 72 y.o. female who was admitted 07/02/2013 for operative treatment ofLeft knee DJD. Patient has severe unremitting pain that affects sleep, daily activities, and work/hobbies. After pre-op clearance the patient was taken to the operating room on 07/02/2013 and underwent  Procedure(s): TOTAL KNEE ARTHROPLASTY- left.    Patient  was given perioperative antibiotics: Anti-infectives   Start     Dose/Rate Route Frequency Ordered Stop   07/02/13 1930  ceFAZolin (ANCEF) IVPB 2 g/50 mL premix     2 g 100 mL/hr over 30 Minutes Intravenous Every 6 hours 07/02/13 1754 07/03/13 0255   07/02/13 1215  ceFAZolin (ANCEF) IVPB 2 g/50 mL premix     2 g 100 mL/hr over 30 Minutes Intravenous  Once 07/02/13 1212 07/02/13 1130       Patient was given sequential compression devices, early ambulation, and chemoprophylaxis to prevent DVT.  Patient benefited maximally from hospital stay and there were no complications.    Recent vital signs: Patient Vitals for the past 24 hrs:  BP Temp Temp src Pulse Resp SpO2 Height Weight  07/05/13 0400 - - - - 18 - - -  07/05/13 0100 - - - - - - 5\' 4"  (1.626 m) 107.956 kg (238 lb)  07/05/13 0000 - - - - 20 - - -  07/04/13 2035 140/93 mmHg 98.5 F (36.9 C) Oral 65 18 99 % - -  07/04/13 2000 - - - - 18 - - -  07/04/13 1300 157/69 mmHg 98.2 F (36.8 C) - 63 18 98 % - -  07/04/13 0618 143/62 mmHg 98.2 F (36.8 C) Oral 67 17 98 % - -     Recent laboratory studies:  Recent Labs  07/03/13 0530 07/04/13 0602  WBC 9.3 9.7  HGB 12.0 10.7*  HCT 36.8 32.5*  PLT 134* 125*  NA 144 141  K 4.7 4.6  CL 108 108  CO2 28 26  BUN 13 18  CREATININE 0.73 0.75  GLUCOSE 184* 174*  CALCIUM 8.6 8.7     Discharge Medications:     Medication List    STOP taking these medications       Fish Oil 1000 MG Caps      TAKE these medications       acetaminophen 325 MG tablet  Commonly known as:  TYLENOL  Take 2 tablets (650 mg total) by mouth every 6 (six) hours as needed for pain or fever.     amLODipine 5 MG tablet  Commonly known as:  NORVASC  Take 5 mg by mouth daily.     aspirin 325 MG EC tablet  1 tablet daily for 1 month to prevent blood clots     bisacodyl 5 MG EC tablet  Commonly known as:  DULCOLAX  Take 2 tablets every night with dinner until bowel movement.  LAXITIVE.  Restart if  two days since last bowel movement     celecoxib 200 MG capsule  Commonly known as:  CELEBREX  Take 1 capsule (200 mg total) by mouth daily.     CENTRUM SILVER PO  Take 1 tablet by mouth daily.     chlorzoxazone 500 MG tablet  Commonly known as:  PARAFON  Take 500 mg by mouth 3 (three) times daily as needed for muscle spasms.     CULTURELLE Caps  Take 1 capsule by mouth daily.     dexlansoprazole 60 MG capsule  Commonly known as:  DEXILANT  Take 60 mg by mouth every morning.     DSS 100 MG Caps  1 tab 2 times a day while on narcotics.  STOOL SOFTENER     gabapentin 300 MG capsule  Commonly known as:  NEURONTIN  Take 300 mg by mouth at bedtime.     hydrochlorothiazide 12.5 MG capsule  Commonly known as:  MICROZIDE  Take 12.5 mg by mouth every morning.     levothyroxine 100 MCG tablet  Commonly known as:  SYNTHROID, LEVOTHROID  Take 100 mcg by mouth at bedtime.     lovastatin 40 MG tablet  Commonly known as:  MEVACOR  Take 40 mg by mouth at bedtime.     metFORMIN 500 MG tablet  Commonly known as:  GLUCOPHAGE  Take 500 mg by mouth daily with breakfast.     oxyCODONE 5 MG immediate release tablet  Commonly known as:  Oxy IR/ROXICODONE  1-2 tablets every 4-6 hrs as needed for pain     PATANASE 0.6 % Soln  Generic drug:  Olopatadine HCl  Place 1 each into the nose daily as needed (for allergies).     ranitidine 150 MG tablet  Commonly known as:  ZANTAC  Take 150 mg by mouth at bedtime.     telmisartan 80 MG tablet  Commonly known as:  MICARDIS  Take 80 mg by mouth every morning.     VICTOZA 18 MG/3ML Soln injection  Generic drug:  Liraglutide  Inject 18 mg into the skin daily.     VISINE 0.05 % ophthalmic solution  Generic drug:  tetrahydrozoline  Place 1 drop into both eyes 2 (two) times daily as needed (for allergies).     Vitamin D 2000 UNITS Caps  Take 1 capsule by mouth daily.        Diagnostic Studies: Dg  Chest 2 View  06/25/2013   *RADIOLOGY  REPORT*  Clinical Data: Preop  CHEST - 2 VIEW  Comparison: 03/01/2012  Findings: Normal heart size.  Clear lungs.  No pneumothorax or pleural effusion.  IMPRESSION: No active cardiopulmonary disease.   Original Report Authenticated By: Jolaine Click, M.D.   Dg Epidurography  06/29/2013   *RADIOLOGY REPORT*  Clinical Data: Lumbosacral spondylosis without myelopathy.  Back pain.  Lumbar spondylosis.  L4-L5 and L5-S1 facet arthrosis.  LUMBAR INTERLAMINAR EPIDURAL INJECTION  Procedure: After a thorough discussion of risks and benefits of the procedure, written and verbal consent was obtained.  Specific risks included puncture of the thecal sac and dura as well as nontherapeutic injection with general risks of bleeding, infection, injury to nerves, blood vessels, and adjacent structures. Time out form was completed.  Verbal consent was obtained by Dr. Carlota Raspberry.   We discussed the moderate likelihood of moderate lasting relief/attainment of therapeutic goal.   The overlying skin was cleansed with betadine soap and anesthetized with 1% lidocaine without epinephrine. An interlaminar approach was performed at L4- L5 on the left. 3-1/2 inches 20 gauge needle was advanced using loss-of-resistance technique.  DIAGNOSTIC/THERAPUETIC EPIDURAL INJECTION:  Injection of Omnipaque 180 shows a good epidural pattern with spread above and below the level of needle placement, primarily on the side of needle placement.  No vascular or subarachnoid  opacification was  seen. 120 mg of Depo-Medrol mixed with 5 cc of 1% Lidocaine were instilled.  The procedure was well-tolerated, and the patient was discharged thirty minutes following the injection in good condition.  Fluoroscopy Time:  28 seconds  IMPRESSION:  Technically successful first lumbar interlaminar epidural injection at left L4-L5.   Original Report Authenticated By: Andreas Newport, M.D.    Disposition: D/C to Skilled Nursing Care      Discharge Orders   Future Orders  Complete By Expires   Call MD / Call 911  As directed    Comments:     If you experience chest pain or shortness of breath, CALL 911 and be transported to the hospital emergency room.  If you develope a fever above 101 F, pus (white drainage) or increased drainage or redness at the wound, or calf pain, call your surgeon's office.   Change dressing  As directed    Comments:     Change the dressing daily with sterile 4 x 4 inch gauze dressing and apply TED hose.  You may clean the incision with alcohol prior to redressing.   Constipation Prevention  As directed    Comments:     Drink plenty of fluids.  Prune juice may be helpful.  You may use a stool softener, such as Colace (over the counter) 100 mg twice a day.  Use MiraLax (over the counter) for constipation as needed.   CPM  As directed    Comments:     Continuous passive motion machine (CPM):      Use the CPM from 0 to 90 for 6 hours per day.       You may break it up into 2 or 3 sessions per day.      Use CPM for 2 weeks or until you are told to stop.   Diet - low sodium heart healthy  As directed    Discharge instructions  As directed    Comments:     Total Knee Replacement Care After Refer to this sheet in the next few weeks. These discharge instructions provide you with  general information on caring for yourself after you leave the hospital. Your caregiver may also give you specific instructions. Your treatment has been planned according to the most current medical practices available, but unavoidable complications sometimes occur. If you have any problems or questions after discharge, please call your caregiver. Regaining a near full range of motion of your knee within the first 3 to 6 weeks after surgery is critical. HOME CARE INSTRUCTIONS  You may resume a normal diet and activities as directed.  Perform exercises as directed.  Place yellow foam block, yellow side up under heel at all times except when in CPM or when walking.  DO  NOT modify, tear, cut, or change in any way. You will receive physical therapy daily  Take showers instead of baths until informed otherwise.  Change bandages (dressings)daily Do not take over-the-counter or prescription medicines for pain, discomfort, or fever. Eat a well-balanced diet.  Avoid lifting or driving until you are instructed otherwise.  Make an appointment to see your caregiver for stitches (suture) or staple removal as directed.  If you have been sent home with a continuous passive motion machine (CPM machine), 0-90 degrees 6 hrs a day   2 hrs a shift SEEK MEDICAL CARE IF: You have swelling of your calf or leg.  You develop shortness of breath or chest pain.  You have redness, swelling, or increasing pain in the wound.  There is pus or any unusual drainage coming from the surgical site.  You notice a bad smell coming from the surgical site or dressing.  The surgical site breaks open after sutures or staples have been removed.  There is persistent bleeding from the suture or staple line.  You are getting worse or are not improving.  You have any other questions or concerns.  SEEK IMMEDIATE MEDICAL CARE IF:  You have a fever.  You develop a rash.  You have difficulty breathing.  You develop any reaction or side effects to medicines given.  Your knee motion is decreasing rather than improving.  MAKE SURE YOU:  Understand these instructions.  Will watch your condition.  Will get help right away if you are not doing well or get worse.   Do not put a pillow under the knee. Place it under the heel.  As directed    Comments:     Place yellow foam block, yellow side up under heel at all times except when in CPM or when walking.  DO NOT modify, tear, cut, or change in any way the yellow foam block.   Increase activity slowly as tolerated  As directed    TED hose  As directed    Comments:     Use stockings (TED hose) for 2 weeks on both leg(s).  You may remove them at night for  sleeping.      Follow-up Information   Follow up with Nilda Simmer, MD On 07/16/2013. (appt time 2 pm)    Specialty:  Orthopedic Surgery   Contact information:   [address] [place] [address] [phone]        Signed: Pascal Lux 07/05/2013, 6:07 AM

## 2013-07-05 NOTE — Progress Notes (Signed)
Clinical Child psychotherapist (CSW) informed that pt is dc'ing today to [facility]. CSW prepared and placed dc packet and informed that pt would be transported by car with son at 15:00. No additional needs, CSW signing off.  Theresia Bough, MSW, LCSW [phone]

## 2013-07-05 NOTE — Progress Notes (Signed)
Subjective: 3 Days Post-Op Procedure(s) (LRB): TOTAL KNEE ARTHROPLASTY- left (Left) Patient reports pain as 3 on 0-10 scale.    Objective: Vital signs in last 24 hours: Temp:  [98.2 F (36.8 C)-98.5 F (36.9 C)] 98.5 F (36.9 C) (08/27 2035) Pulse Rate:  [63-67] 65 (08/27 2035) Resp:  [17-20] 18 (08/28 0400) BP: (140-157)/(62-93) 140/93 mmHg (08/27 2035) SpO2:  [98 %-99 %] 99 % (08/27 2035) Weight:  [107.956 kg (238 lb)] 107.956 kg (238 lb) (08/28 0100)  Intake/Output from previous day: 08/27 0701 - 08/28 0700 In: 1100 [P.O.:1100] Out: -  Intake/Output this shift: Total I/O In: 360 [P.O.:360] Out: -    Recent Labs  07/03/13 0530 07/04/13 0602  HGB 12.0 10.7*    Recent Labs  07/03/13 0530 07/04/13 0602  WBC 9.3 9.7  RBC 4.01 3.57*  HCT 36.8 32.5*  PLT 134* 125*    Recent Labs  07/03/13 0530 07/04/13 0602  NA 144 141  K 4.7 4.6  CL 108 108  CO2 28 26  BUN 13 18  CREATININE 0.73 0.75  GLUCOSE 184* 174*  CALCIUM 8.6 8.7   No results found for this basename: LABPT, INR,  in the last 72 hours  ABD soft Neurovascular intact Sensation intact distally Intact pulses distally Dorsiflexion/Plantar flexion intact Incision: scant drainage Compartment soft constipation  Assessment/Plan: 3 Days Post-Op Procedure(s) (LRB): TOTAL KNEE ARTHROPLASTY- left (Left) Up with therapy Discharge to SNF Dulocolax suppository  Megan Horton J 07/05/2013, 5:52 AM

## 2013-07-05 NOTE — Progress Notes (Signed)
Physical Therapy Treatment Patient Details Name: Georgian Mcclory MRN: [id number] DOB: Jul 29, 1941 Today's Date: 07/05/2013 Time: 4098-1191 PT Time Calculation (min): 14 min  PT Assessment / Plan / Recommendation  History of Present Illness s/p LTKA; pt lives alone   PT Comments   Patient continued to progress well today. Eager to DC to rehab to continue therapy prior to going home alone  Follow Up Recommendations  SNF     Does the patient have the potential to tolerate intense rehabilitation     Barriers to Discharge        Equipment Recommendations  Rolling walker with 5" wheels;3in1 (PT)    Recommendations for Other Services    Frequency 7X/week   Progress towards PT Goals Progress towards PT goals: Progressing toward goals  Plan Current plan remains appropriate    Precautions / Restrictions Precautions Precautions: Knee Knee Immobilizer - Left: Discontinue once straight leg raise with < 10 degree lag Restrictions Weight Bearing Restrictions: No LLE Weight Bearing: Weight bearing as tolerated   Pertinent Vitals/Pain Denied pain   Mobility  Bed Mobility Bed Mobility: Not assessed Transfers Sit to Stand: 5: Supervision;From chair/3-in-1 Stand to Sit: 5: Supervision;To chair/3-in-1 Details for Transfer Assistance: no cues needed Ambulation/Gait Ambulation/Gait Assistance: 5: Supervision Ambulation Distance (Feet): 250 Feet Assistive device: Rolling walker Ambulation/Gait Assistance Details: Continues to have good knee control with safe use of RW Gait Pattern: Step-to pattern    Exercises Total Joint Exercises Quad Sets: AROM;Left;10 reps Short Arc Quad: AROM;Left;10 reps Heel Slides: Left;10 reps;AROM Hip ABduction/ADduction: AROM;Left;10 reps Straight Leg Raises: AROM;Left;10 reps Long Arc Quad: AROM;Left;10 reps   PT Diagnosis:    PT Problem List:   PT Treatment Interventions:     PT Goals (current goals can now be found in the care plan section)    Visit  Information  Last PT Received On: 07/05/13 Assistance Needed: +1 History of Present Illness: s/p LTKA; pt lives alone    Subjective Data      Cognition  Cognition Arousal/Alertness: Awake/alert Behavior During Therapy: WFL for tasks assessed/performed Overall Cognitive Status: Within Functional Limits for tasks assessed    Balance     End of Session PT - End of Session Equipment Utilized During Treatment: Gait belt Activity Tolerance: Patient tolerated treatment well Patient left: in chair;with call bell/phone within reach CPM Left Knee CPM Left Knee: Off   GP     Fredrich Birks 07/05/2013, 11:33 AM 07/05/2013 Fredrich Birks PTA [phone] pager [phone] office

## 2013-07-05 NOTE — Progress Notes (Signed)
Picked up home medication from pharmacy Victoza, gave to patient to take to [facility] per family request.

## 2013-07-06 ENCOUNTER — Non-Acute Institutional Stay (SKILLED_NURSING_FACILITY): Payer: Medicare Other | Admitting: Adult Health

## 2013-07-06 DIAGNOSIS — G609 Hereditary and idiopathic neuropathy, unspecified: Secondary | ICD-10-CM

## 2013-07-06 DIAGNOSIS — E119 Type 2 diabetes mellitus without complications: Secondary | ICD-10-CM | POA: Diagnosis not present

## 2013-07-06 DIAGNOSIS — M1710 Unilateral primary osteoarthritis, unspecified knee: Secondary | ICD-10-CM

## 2013-07-06 DIAGNOSIS — I10 Essential (primary) hypertension: Secondary | ICD-10-CM

## 2013-07-06 DIAGNOSIS — K5900 Constipation, unspecified: Secondary | ICD-10-CM

## 2013-07-06 DIAGNOSIS — K219 Gastro-esophageal reflux disease without esophagitis: Secondary | ICD-10-CM

## 2013-07-06 DIAGNOSIS — E039 Hypothyroidism, unspecified: Secondary | ICD-10-CM

## 2013-07-06 DIAGNOSIS — E785 Hyperlipidemia, unspecified: Secondary | ICD-10-CM

## 2013-07-06 DIAGNOSIS — G629 Polyneuropathy, unspecified: Secondary | ICD-10-CM

## 2013-07-06 DIAGNOSIS — M1712 Unilateral primary osteoarthritis, left knee: Secondary | ICD-10-CM

## 2013-07-12 ENCOUNTER — Non-Acute Institutional Stay (SKILLED_NURSING_FACILITY): Payer: Medicare Other | Admitting: Internal Medicine

## 2013-07-12 DIAGNOSIS — M1712 Unilateral primary osteoarthritis, left knee: Secondary | ICD-10-CM

## 2013-07-12 DIAGNOSIS — D62 Acute posthemorrhagic anemia: Secondary | ICD-10-CM

## 2013-07-12 DIAGNOSIS — E119 Type 2 diabetes mellitus without complications: Secondary | ICD-10-CM

## 2013-07-12 DIAGNOSIS — M1710 Unilateral primary osteoarthritis, unspecified knee: Secondary | ICD-10-CM

## 2013-07-12 DIAGNOSIS — E039 Hypothyroidism, unspecified: Secondary | ICD-10-CM | POA: Diagnosis not present

## 2013-07-17 ENCOUNTER — Encounter: Payer: Self-pay | Admitting: Adult Health

## 2013-07-17 ENCOUNTER — Non-Acute Institutional Stay (SKILLED_NURSING_FACILITY): Payer: Medicare Other | Admitting: Adult Health

## 2013-07-17 DIAGNOSIS — E785 Hyperlipidemia, unspecified: Secondary | ICD-10-CM

## 2013-07-17 DIAGNOSIS — E039 Hypothyroidism, unspecified: Secondary | ICD-10-CM

## 2013-07-17 DIAGNOSIS — G609 Hereditary and idiopathic neuropathy, unspecified: Secondary | ICD-10-CM

## 2013-07-17 DIAGNOSIS — K5900 Constipation, unspecified: Secondary | ICD-10-CM

## 2013-07-17 DIAGNOSIS — K219 Gastro-esophageal reflux disease without esophagitis: Secondary | ICD-10-CM | POA: Diagnosis not present

## 2013-07-17 DIAGNOSIS — M1710 Unilateral primary osteoarthritis, unspecified knee: Secondary | ICD-10-CM

## 2013-07-17 DIAGNOSIS — I10 Essential (primary) hypertension: Secondary | ICD-10-CM

## 2013-07-17 DIAGNOSIS — E119 Type 2 diabetes mellitus without complications: Secondary | ICD-10-CM | POA: Diagnosis not present

## 2013-07-17 DIAGNOSIS — G629 Polyneuropathy, unspecified: Secondary | ICD-10-CM

## 2013-07-17 DIAGNOSIS — M1712 Unilateral primary osteoarthritis, left knee: Secondary | ICD-10-CM

## 2013-07-17 NOTE — Progress Notes (Signed)
Patient ID: Megan Horton, female   DOB: Jan 13, 1941, 72 y.o.   MRN: [id number]        PROGRESS NOTE  DATE: 07/06/2013  FACILITY:  [facility]  LEVEL OF CARE: SNF (31)  Acute Visit  CHIEF COMPLAINT:  Follow-up hospitalization  HISTORY OF PRESENT ILLNESS: This is a 72 year old female who has been admitted to [facility] place on 07/05/13 from [facility] we have DJD status post left total knee arthroplasty. She has been admitted bed for a short term rehabilitation.  HTN: Pt 's HTN remains stable.  Denies CP, sob, DOE, pedal edema, headaches, dizziness or visual disturbances.  No complications from the medications currently being used.  Last BP : 108/56  PERIPHERAL NEUROPATHY: The peripheral neuropathy is stable. The patient denies pain in the feet, tingling, and numbness. No complications noted from the medication presently being used.  GERD: pt's GERD is stable.  Denies ongoing heartburn, abd. Pain, nausea or vomiting.  Currently on a PPI & tolerates it without any adverse reactions.  PAST MEDICAL HISTORY : Reviewed.  No changes.  CURRENT MEDICATIONS: Reviewed per [facility]  REVIEW OF SYSTEMS:  GENERAL: no change in appetite, no fatigue, no weight changes, no fever, chills or weakness RESPIRATORY: no cough, SOB, DOE, wheezing, hemoptysis CARDIAC: no chest pain, edema or palpitations GI: no abdominal pain, diarrhea, constipation, heart burn, nausea or vomiting  PHYSICAL EXAMINATION  VS:  T 97.8        P 67       RR 18       BP 108/56      POX 96 %       WT 242 (Lb)  GENERAL: no acute distress, normal body habitus EYES: conjunctivae normal, sclerae normal, normal eye lids NECK: supple, trachea midline, no neck masses, no thyroid tenderness, no thyromegaly LYMPHATICS: no LAN in the neck, no supraclavicular LAN RESPIRATORY: breathing is even & unlabored, BS CTAB CARDIAC: RRR, no murmur,no extra heart sounds, no edema GI: abdomen soft, normal BS, no masses, no  tenderness, no hepatomegaly, no splenomegaly PSYCHIATRIC: the patient is alert & oriented to person, affect & behavior appropriate  LABS/RADIOLOGY: 07/04/13 WBC 9.7 hemoglobin 10.7 hematocrit 32.5 sodium 141 potassium 4.6 BUN 18 creatinine 0.75 glucose 174 calcium 8.7  ASSESSMENT/PLAN:  DJD status post left total knee arthroplasty - for PT and OT  Diabetes mellitus, type II - continue metformin and Victoza  Hypertension - discontinue Microzide  GERD - stable  Constipation - complaints  Neuropathy - no complaints of peripheral pains  Hypothyroidism - stable    CPT CODE: 40981

## 2013-07-17 NOTE — Progress Notes (Signed)
Patient ID: Megan Horton, female   DOB: 10/05/1941, 72 y.o.   MRN: [id number]       PROGRESS NOTE  DATE: 07/17/2013   FACILITY: [facility]  LEVEL OF CARE: SNF (31)    CHIEF COMPLAINT:  Manage  HISTORY OF PRESENT ILLNESS: This is a 72 year old female who is for discharge home with home health PT, OT and nursing.  DME: Rolling walker due to unsteady gait. She has been admitted to [facility] on 07/05/13 from [facility] with DJD status post left total knee arthroplasty. Patient was admitted to this facility for short-term rehabilitation. Patient has completed SNF rehabilitation and therapy has cleared the patient for discharge.  Reassessment of ongoing problem(s):  HTN: Pt 's HTN remains stable.  Denies CP, sob, DOE, pedal edema, headaches, dizziness or visual disturbances.  No complications from the medications currently being used.  Last BP : 124/58  DM:pt's DM remains stable.  Pt denies polyuria, polydipsia, polyphagia, changes in vision or hypoglycemic episodes.  No complications noted from the medication presently being used.    HYPOTHYROIDISM: The hypothyroidism remains stable. No complications noted from the medications presently being used.  The patient denies fatigue or constipation.    PAST MEDICAL HISTORY : Reviewed.  No changes.  CURRENT MEDICATIONS: Reviewed per [facility]  REVIEW OF SYSTEMS:  GENERAL: no change in appetite, no fatigue, no weight changes, no fever, chills or weakness RESPIRATORY: no cough, SOB, DOE, wheezing, hemoptysis CARDIAC: no chest pain, edema or palpitations GI: no abdominal pain, diarrhea, constipation, heart burn, nausea or vomiting  PHYSICAL EXAMINATION  VS:  T 97.7       P 74       RR 18      BP 124/58      POX 98 %       WT 231.2 (Lb)  GENERAL: no acute distress, normal body habitus NECK: supple, trachea midline, no neck masses, no thyroid tenderness, no thyromegaly LYMPHATICS: no LAN in the neck, no supraclavicular  LAN RESPIRATORY: breathing is even & unlabored, BS CTAB CARDIAC: RRR, no murmur,no extra heart sounds, no edema GI: abdomen soft, normal BS, no masses, no tenderness, no hepatomegaly, no splenomegaly PSYCHIATRIC: the patient is alert & oriented to person, affect & behavior appropriate  LABS/RADIOLOGY: 07/13/13 sodium 140 potassium 4.3 glucose 153 BUN 13 creatinine 0.7 calcium 9.1 WBC 6.8 hemoglobin 11.6 hematocrit 37.6 07/04/13 WBC 9.7 hemoglobin 10.7 hematocrit 32.5 sodium 141 potassium 4.6 BUN 18 creatinine 0.75 glucose 174 calcium 8.7  ASSESSMENT/PLAN:  DJD status post left total knee arthroplasty - for Home health Nursing, PT and OT  Diabetes mellitus, type II - well-controlled Hypertension - discontinue Microzide  Hyperlipidemia - continue Lipitor  GERD - stable  Constipation - complaints  Neuropathy - stable  Hypothyroidism - stable    I have filled out patient's discharge paperwork and written prescriptions.  Patient will receive home health PT, OT and Nursing.  DME provided: Rolling-walker due to unsteady gait  Total discharge time: Greater than 30 minutes Discharge time involved coordination of the discharge process with Child psychotherapist, nursing staff and therapy department. Medical justification for home health services/DME verified.  CPT CODE: 40981

## 2013-07-19 DIAGNOSIS — Z96659 Presence of unspecified artificial knee joint: Secondary | ICD-10-CM | POA: Diagnosis not present

## 2013-07-19 DIAGNOSIS — E119 Type 2 diabetes mellitus without complications: Secondary | ICD-10-CM | POA: Diagnosis not present

## 2013-07-19 DIAGNOSIS — Z471 Aftercare following joint replacement surgery: Secondary | ICD-10-CM | POA: Diagnosis not present

## 2013-07-19 DIAGNOSIS — M47817 Spondylosis without myelopathy or radiculopathy, lumbosacral region: Secondary | ICD-10-CM | POA: Diagnosis not present

## 2013-07-19 DIAGNOSIS — IMO0001 Reserved for inherently not codable concepts without codable children: Secondary | ICD-10-CM | POA: Diagnosis not present

## 2013-07-20 DIAGNOSIS — Z471 Aftercare following joint replacement surgery: Secondary | ICD-10-CM | POA: Diagnosis not present

## 2013-07-20 DIAGNOSIS — E119 Type 2 diabetes mellitus without complications: Secondary | ICD-10-CM | POA: Diagnosis not present

## 2013-07-20 DIAGNOSIS — IMO0001 Reserved for inherently not codable concepts without codable children: Secondary | ICD-10-CM | POA: Diagnosis not present

## 2013-07-20 DIAGNOSIS — Z96659 Presence of unspecified artificial knee joint: Secondary | ICD-10-CM | POA: Diagnosis not present

## 2013-07-20 DIAGNOSIS — M47817 Spondylosis without myelopathy or radiculopathy, lumbosacral region: Secondary | ICD-10-CM | POA: Diagnosis not present

## 2013-07-23 DIAGNOSIS — Z471 Aftercare following joint replacement surgery: Secondary | ICD-10-CM | POA: Diagnosis not present

## 2013-07-23 DIAGNOSIS — E119 Type 2 diabetes mellitus without complications: Secondary | ICD-10-CM | POA: Diagnosis not present

## 2013-07-23 DIAGNOSIS — Z96659 Presence of unspecified artificial knee joint: Secondary | ICD-10-CM | POA: Diagnosis not present

## 2013-07-23 DIAGNOSIS — IMO0001 Reserved for inherently not codable concepts without codable children: Secondary | ICD-10-CM | POA: Diagnosis not present

## 2013-07-23 DIAGNOSIS — M47817 Spondylosis without myelopathy or radiculopathy, lumbosacral region: Secondary | ICD-10-CM | POA: Diagnosis not present

## 2013-07-25 DIAGNOSIS — Z471 Aftercare following joint replacement surgery: Secondary | ICD-10-CM | POA: Diagnosis not present

## 2013-07-25 DIAGNOSIS — M47817 Spondylosis without myelopathy or radiculopathy, lumbosacral region: Secondary | ICD-10-CM | POA: Diagnosis not present

## 2013-07-25 DIAGNOSIS — IMO0001 Reserved for inherently not codable concepts without codable children: Secondary | ICD-10-CM | POA: Diagnosis not present

## 2013-07-25 DIAGNOSIS — E119 Type 2 diabetes mellitus without complications: Secondary | ICD-10-CM | POA: Diagnosis not present

## 2013-07-25 DIAGNOSIS — Z96659 Presence of unspecified artificial knee joint: Secondary | ICD-10-CM | POA: Diagnosis not present

## 2013-07-27 DIAGNOSIS — IMO0001 Reserved for inherently not codable concepts without codable children: Secondary | ICD-10-CM | POA: Diagnosis not present

## 2013-07-27 DIAGNOSIS — M47817 Spondylosis without myelopathy or radiculopathy, lumbosacral region: Secondary | ICD-10-CM | POA: Diagnosis not present

## 2013-07-27 DIAGNOSIS — E119 Type 2 diabetes mellitus without complications: Secondary | ICD-10-CM | POA: Diagnosis not present

## 2013-07-27 DIAGNOSIS — Z96659 Presence of unspecified artificial knee joint: Secondary | ICD-10-CM | POA: Diagnosis not present

## 2013-07-27 DIAGNOSIS — Z471 Aftercare following joint replacement surgery: Secondary | ICD-10-CM | POA: Diagnosis not present

## 2013-07-30 DIAGNOSIS — Z471 Aftercare following joint replacement surgery: Secondary | ICD-10-CM | POA: Diagnosis not present

## 2013-07-30 DIAGNOSIS — M47817 Spondylosis without myelopathy or radiculopathy, lumbosacral region: Secondary | ICD-10-CM | POA: Diagnosis not present

## 2013-07-30 DIAGNOSIS — Z96659 Presence of unspecified artificial knee joint: Secondary | ICD-10-CM | POA: Diagnosis not present

## 2013-07-30 DIAGNOSIS — E119 Type 2 diabetes mellitus without complications: Secondary | ICD-10-CM | POA: Diagnosis not present

## 2013-07-30 DIAGNOSIS — IMO0001 Reserved for inherently not codable concepts without codable children: Secondary | ICD-10-CM | POA: Diagnosis not present

## 2013-08-01 DIAGNOSIS — M47817 Spondylosis without myelopathy or radiculopathy, lumbosacral region: Secondary | ICD-10-CM | POA: Diagnosis not present

## 2013-08-01 DIAGNOSIS — Z471 Aftercare following joint replacement surgery: Secondary | ICD-10-CM | POA: Diagnosis not present

## 2013-08-01 DIAGNOSIS — E119 Type 2 diabetes mellitus without complications: Secondary | ICD-10-CM | POA: Diagnosis not present

## 2013-08-01 DIAGNOSIS — Z96659 Presence of unspecified artificial knee joint: Secondary | ICD-10-CM | POA: Diagnosis not present

## 2013-08-01 DIAGNOSIS — IMO0001 Reserved for inherently not codable concepts without codable children: Secondary | ICD-10-CM | POA: Diagnosis not present

## 2013-08-03 DIAGNOSIS — IMO0001 Reserved for inherently not codable concepts without codable children: Secondary | ICD-10-CM | POA: Diagnosis not present

## 2013-08-03 DIAGNOSIS — M47817 Spondylosis without myelopathy or radiculopathy, lumbosacral region: Secondary | ICD-10-CM | POA: Diagnosis not present

## 2013-08-03 DIAGNOSIS — Z96659 Presence of unspecified artificial knee joint: Secondary | ICD-10-CM | POA: Diagnosis not present

## 2013-08-03 DIAGNOSIS — Z471 Aftercare following joint replacement surgery: Secondary | ICD-10-CM | POA: Diagnosis not present

## 2013-08-03 DIAGNOSIS — E119 Type 2 diabetes mellitus without complications: Secondary | ICD-10-CM | POA: Diagnosis not present

## 2013-08-06 DIAGNOSIS — M47817 Spondylosis without myelopathy or radiculopathy, lumbosacral region: Secondary | ICD-10-CM | POA: Diagnosis not present

## 2013-08-06 DIAGNOSIS — Z471 Aftercare following joint replacement surgery: Secondary | ICD-10-CM | POA: Diagnosis not present

## 2013-08-06 DIAGNOSIS — IMO0001 Reserved for inherently not codable concepts without codable children: Secondary | ICD-10-CM | POA: Diagnosis not present

## 2013-08-06 DIAGNOSIS — E119 Type 2 diabetes mellitus without complications: Secondary | ICD-10-CM | POA: Diagnosis not present

## 2013-08-06 DIAGNOSIS — Z96659 Presence of unspecified artificial knee joint: Secondary | ICD-10-CM | POA: Diagnosis not present

## 2013-08-07 DIAGNOSIS — D62 Acute posthemorrhagic anemia: Secondary | ICD-10-CM | POA: Insufficient documentation

## 2013-08-07 NOTE — Progress Notes (Signed)
Patient ID: Megan Horton, female   DOB: [birth year], 72 y.o.   MRN: [id number]        HISTORY & PHYSICAL  DATE: 07/12/2013   FACILITY: [facility]  LEVEL OF CARE: SNF (31)  ALLERGIES:  Allergies  Allergen Reactions  . Adhesive [Tape] Other (See Comments)    blister  . Tylenol [Acetaminophen] Nausea And Vomiting    Aggravates acid reflux    CHIEF COMPLAINT:  Manage left knee osteoarthritis, hypothyroidism, and diabetes mellitus.    HISTORY OF PRESENT ILLNESS:  The patient is a 72 year-old, Caucasian female.    KNEE OSTEOARTHRITIS: Patient had a history of pain and functional disability in the knee due to end-stage osteoarthritis and has failed nonsurgical conservative treatments. Patient had worsening of pain with activity and weight bearing, pain that interfered with activities of daily living & pain with passive range of motion. Therefore patient underwent total knee arthroplasty and tolerated the procedure well. Patient is admitted to this facility for sort short-term rehabilitation. Patient denies knee pain.    HYPOTHYROIDISM: The hypothyroidism remains stable. No complications noted from the medications presently being used.  The patient denies fatigue or constipation.  Last TSH:  Not available.    DM:pt's DM remains stable.  Pt denies polyuria, polydipsia, polyphagia, changes in vision or hypoglycemic episodes.  No complications noted from the medication presently being used.  Last hemoglobin A1c is:  Not available.    PAST MEDICAL HISTORY :  Past Medical History  Diagnosis Date  . Hypertension   . Peripheral vascular disease   . Peripheral neuropathy   . Seasonal allergies   . Diabetes mellitus   . Hypothyroidism   . GERD (gastroesophageal reflux disease)   . Arthritis   . Sleep apnea     CPAP, sleep study 3-4 years ago in [place], [place] Dr. Horace Porteous   . Wears glasses   . Adhesive capsulitis of right shoulder   . Partial tear of rotator  cuff(726.13)   . Complication of anesthesia 5/13    did have some disorientation post cerv fusion due to low sats  . Left knee DJD   . Seasonal allergies   . Urgency of urination   . Hyperlipemia     PAST SURGICAL HISTORY: Past Surgical History  Procedure Laterality Date  . Abdominal hysterectomy    . Cholecystectomy  2009  . Hemorrhoid surgery  2009  . Knee arthroscopy      Left x2  . Carpal tunnel release      bilat  . Cervical fusion    . Tonsillectomy    . Varicose vein surgery      Right  . Heel spur excision      bilat  . Nose surgery    . Anterior cervical decomp/discectomy fusion  03/10/2012    Procedure: ANTERIOR CERVICAL DECOMPRESSION/DISCECTOMY FUSION 1 LEVEL;  Surgeon: Tia Alert, MD;  Location: [facility];  Service: Neurosurgery;  Laterality: N/A;  Cervical five-six anterior cervical decompression fusion with plating  . Eye surgery  2012    bilat cataract  . Shoulder arthroscopy  06/27/2012    Procedure: ARTHROSCOPY SHOULDER;  Surgeon: Nilda Simmer, MD;  Location: [facility];  Service: Orthopedics;  Laterality: Right;  right shoulder arthroscopy debridement extensive, distal clavulectomy, with resect adhesions with maniplulation  . Colonoscopy w/ polypectomy    . Total knee arthroplasty Left 07/02/2013    Procedure: TOTAL KNEE ARTHROPLASTY- left;  Surgeon: Nilda Simmer,  MD;  Location: [facility];  Service: Orthopedics;  Laterality: Left;    SOCIAL HISTORY:  reports that she quit smoking about 19 years ago. Her smoking use included Cigarettes. She has a 4 pack-year smoking history. She has never used smokeless tobacco. She reports that  drinks alcohol. She reports that she does not use illicit drugs.  FAMILY HISTORY:  Family History  Problem Relation Age of Onset  . Heart disease Mother   . Heart disease Father   . Heart disease Brother   . Heart attack Father   . Heart attack Brother   . Heart attack Sister   . Hypertension Mother   .  Hypertension Father   . Hypertension Sister   . Stroke Mother   . Stroke Brother     CURRENT MEDICATIONS: Reviewed per [facility]  REVIEW OF SYSTEMS:  See HPI otherwise 14 point ROS is negative.  PHYSICAL EXAMINATION  VS:  T 98.3      P 80      RR 18      BP 103/70      POX%        WT (Lb) 242    GENERAL: no acute distress, moderately obese body habitus EYES: conjunctivae normal, sclerae normal, normal eye lids MOUTH/THROAT: lips without lesions,no lesions in the mouth,tongue is without lesions,uvula elevates in midline NECK: supple, trachea midline, no neck masses, no thyroid tenderness, no thyromegaly LYMPHATICS: no LAN in the neck, no supraclavicular LAN RESPIRATORY: breathing is even & unlabored, BS CTAB CARDIAC: RRR, no murmur,no extra heart sounds EDEMA/VARICOSITIES: left lower extremity has +2 edema, right lower extremity has +1 edema   ARTERIAL: pedal pulses +1 bilaterally    GI:  ABDOMEN: abdomen soft, normal BS, no masses, no tenderness  LIVER/SPLEEN: no hepatomegaly, no splenomegaly MUSCULOSKELETAL: HEAD: normal to inspection & palpation BACK: no kyphosis, scoliosis or spinal processes tenderness EXTREMITIES: LEFT UPPER EXTREMITY: full range of motion, normal strength & tone RIGHT UPPER EXTREMITY:  full range of motion, normal strength & tone LEFT LOWER EXTREMITY: strength intact, range of motion not tested due to surgery   RIGHT LOWER EXTREMITY: strength intact, range of motion moderate   PSYCHIATRIC: the patient is alert & oriented to person, affect & behavior appropriate  LABS/RADIOLOGY: Hemoglobin 10.7, platelets 125, WBC 9.7.    BMP normal except glucose 174.    Chest x-ray did not show acute disease.    ASSESSMENT/PLAN:  Left knee osteoarthritis.  Status post left total knee arthroplasty.  Continue rehabilitation.    Hypothyroidism.  Continue levothyroxine.    Diabetes mellitus.  Continue current medications.    Acute blood loss anemia.  Reassess.     Hypertension.  Adequately controlled.    Peripheral neuropathy.  Continue Neurontin.    Check CBC and BMP.    I have reviewed patient's medical records received at admission/from hospitalization.  CPT CODE: 16109

## 2013-08-08 DIAGNOSIS — Z96659 Presence of unspecified artificial knee joint: Secondary | ICD-10-CM | POA: Diagnosis not present

## 2013-08-08 DIAGNOSIS — Z471 Aftercare following joint replacement surgery: Secondary | ICD-10-CM | POA: Diagnosis not present

## 2013-08-08 DIAGNOSIS — E119 Type 2 diabetes mellitus without complications: Secondary | ICD-10-CM | POA: Diagnosis not present

## 2013-08-08 DIAGNOSIS — IMO0001 Reserved for inherently not codable concepts without codable children: Secondary | ICD-10-CM | POA: Diagnosis not present

## 2013-08-08 DIAGNOSIS — M47817 Spondylosis without myelopathy or radiculopathy, lumbosacral region: Secondary | ICD-10-CM | POA: Diagnosis not present

## 2013-08-10 DIAGNOSIS — Z471 Aftercare following joint replacement surgery: Secondary | ICD-10-CM | POA: Diagnosis not present

## 2013-08-10 DIAGNOSIS — IMO0001 Reserved for inherently not codable concepts without codable children: Secondary | ICD-10-CM | POA: Diagnosis not present

## 2013-08-10 DIAGNOSIS — M47817 Spondylosis without myelopathy or radiculopathy, lumbosacral region: Secondary | ICD-10-CM | POA: Diagnosis not present

## 2013-08-10 DIAGNOSIS — E119 Type 2 diabetes mellitus without complications: Secondary | ICD-10-CM | POA: Diagnosis not present

## 2013-08-10 DIAGNOSIS — Z96659 Presence of unspecified artificial knee joint: Secondary | ICD-10-CM | POA: Diagnosis not present

## 2013-08-13 DIAGNOSIS — M25569 Pain in unspecified knee: Secondary | ICD-10-CM | POA: Diagnosis not present

## 2013-08-13 DIAGNOSIS — IMO0001 Reserved for inherently not codable concepts without codable children: Secondary | ICD-10-CM | POA: Diagnosis not present

## 2013-08-13 DIAGNOSIS — Z96659 Presence of unspecified artificial knee joint: Secondary | ICD-10-CM | POA: Diagnosis not present

## 2013-08-13 DIAGNOSIS — R269 Unspecified abnormalities of gait and mobility: Secondary | ICD-10-CM | POA: Diagnosis not present

## 2013-08-15 DIAGNOSIS — IMO0001 Reserved for inherently not codable concepts without codable children: Secondary | ICD-10-CM | POA: Diagnosis not present

## 2013-08-15 DIAGNOSIS — R269 Unspecified abnormalities of gait and mobility: Secondary | ICD-10-CM | POA: Diagnosis not present

## 2013-08-15 DIAGNOSIS — Z96659 Presence of unspecified artificial knee joint: Secondary | ICD-10-CM | POA: Diagnosis not present

## 2013-08-15 DIAGNOSIS — M25569 Pain in unspecified knee: Secondary | ICD-10-CM | POA: Diagnosis not present

## 2013-08-16 DIAGNOSIS — E559 Vitamin D deficiency, unspecified: Secondary | ICD-10-CM | POA: Diagnosis not present

## 2013-08-16 DIAGNOSIS — I10 Essential (primary) hypertension: Secondary | ICD-10-CM | POA: Diagnosis not present

## 2013-08-16 DIAGNOSIS — E039 Hypothyroidism, unspecified: Secondary | ICD-10-CM | POA: Diagnosis not present

## 2013-08-16 DIAGNOSIS — E1149 Type 2 diabetes mellitus with other diabetic neurological complication: Secondary | ICD-10-CM | POA: Diagnosis not present

## 2013-08-20 DIAGNOSIS — R269 Unspecified abnormalities of gait and mobility: Secondary | ICD-10-CM | POA: Diagnosis not present

## 2013-08-20 DIAGNOSIS — M25569 Pain in unspecified knee: Secondary | ICD-10-CM | POA: Diagnosis not present

## 2013-08-20 DIAGNOSIS — Z96659 Presence of unspecified artificial knee joint: Secondary | ICD-10-CM | POA: Diagnosis not present

## 2013-08-20 DIAGNOSIS — IMO0001 Reserved for inherently not codable concepts without codable children: Secondary | ICD-10-CM | POA: Diagnosis not present

## 2013-08-23 DIAGNOSIS — I10 Essential (primary) hypertension: Secondary | ICD-10-CM | POA: Diagnosis not present

## 2013-08-23 DIAGNOSIS — R55 Syncope and collapse: Secondary | ICD-10-CM | POA: Diagnosis not present

## 2013-08-23 DIAGNOSIS — G609 Hereditary and idiopathic neuropathy, unspecified: Secondary | ICD-10-CM | POA: Diagnosis not present

## 2013-08-23 DIAGNOSIS — Z23 Encounter for immunization: Secondary | ICD-10-CM | POA: Diagnosis not present

## 2013-08-23 DIAGNOSIS — E1149 Type 2 diabetes mellitus with other diabetic neurological complication: Secondary | ICD-10-CM | POA: Diagnosis not present

## 2013-08-29 DIAGNOSIS — Z96659 Presence of unspecified artificial knee joint: Secondary | ICD-10-CM | POA: Diagnosis not present

## 2013-08-29 DIAGNOSIS — M25569 Pain in unspecified knee: Secondary | ICD-10-CM | POA: Diagnosis not present

## 2013-08-29 DIAGNOSIS — R269 Unspecified abnormalities of gait and mobility: Secondary | ICD-10-CM | POA: Diagnosis not present

## 2013-08-29 DIAGNOSIS — IMO0001 Reserved for inherently not codable concepts without codable children: Secondary | ICD-10-CM | POA: Diagnosis not present

## 2013-08-30 DIAGNOSIS — G609 Hereditary and idiopathic neuropathy, unspecified: Secondary | ICD-10-CM | POA: Diagnosis not present

## 2013-08-30 DIAGNOSIS — R55 Syncope and collapse: Secondary | ICD-10-CM | POA: Diagnosis not present

## 2013-09-05 DIAGNOSIS — R269 Unspecified abnormalities of gait and mobility: Secondary | ICD-10-CM | POA: Diagnosis not present

## 2013-09-05 DIAGNOSIS — M25569 Pain in unspecified knee: Secondary | ICD-10-CM | POA: Diagnosis not present

## 2013-09-05 DIAGNOSIS — Z96659 Presence of unspecified artificial knee joint: Secondary | ICD-10-CM | POA: Diagnosis not present

## 2013-09-05 DIAGNOSIS — IMO0001 Reserved for inherently not codable concepts without codable children: Secondary | ICD-10-CM | POA: Diagnosis not present

## 2013-09-07 DIAGNOSIS — Z96659 Presence of unspecified artificial knee joint: Secondary | ICD-10-CM | POA: Diagnosis not present

## 2013-09-07 DIAGNOSIS — IMO0001 Reserved for inherently not codable concepts without codable children: Secondary | ICD-10-CM | POA: Diagnosis not present

## 2013-09-07 DIAGNOSIS — R269 Unspecified abnormalities of gait and mobility: Secondary | ICD-10-CM | POA: Diagnosis not present

## 2013-09-07 DIAGNOSIS — M25569 Pain in unspecified knee: Secondary | ICD-10-CM | POA: Diagnosis not present

## 2013-09-12 DIAGNOSIS — R269 Unspecified abnormalities of gait and mobility: Secondary | ICD-10-CM | POA: Diagnosis not present

## 2013-09-12 DIAGNOSIS — IMO0001 Reserved for inherently not codable concepts without codable children: Secondary | ICD-10-CM | POA: Diagnosis not present

## 2013-09-12 DIAGNOSIS — Z96659 Presence of unspecified artificial knee joint: Secondary | ICD-10-CM | POA: Diagnosis not present

## 2013-09-12 DIAGNOSIS — M25569 Pain in unspecified knee: Secondary | ICD-10-CM | POA: Diagnosis not present

## 2013-09-14 DIAGNOSIS — M25569 Pain in unspecified knee: Secondary | ICD-10-CM | POA: Diagnosis not present

## 2013-09-14 DIAGNOSIS — R269 Unspecified abnormalities of gait and mobility: Secondary | ICD-10-CM | POA: Diagnosis not present

## 2013-09-14 DIAGNOSIS — IMO0001 Reserved for inherently not codable concepts without codable children: Secondary | ICD-10-CM | POA: Diagnosis not present

## 2013-09-17 DIAGNOSIS — R269 Unspecified abnormalities of gait and mobility: Secondary | ICD-10-CM | POA: Diagnosis not present

## 2013-09-17 DIAGNOSIS — M25569 Pain in unspecified knee: Secondary | ICD-10-CM | POA: Diagnosis not present

## 2013-09-17 DIAGNOSIS — IMO0001 Reserved for inherently not codable concepts without codable children: Secondary | ICD-10-CM | POA: Diagnosis not present

## 2013-09-19 DIAGNOSIS — IMO0001 Reserved for inherently not codable concepts without codable children: Secondary | ICD-10-CM | POA: Diagnosis not present

## 2013-09-19 DIAGNOSIS — G609 Hereditary and idiopathic neuropathy, unspecified: Secondary | ICD-10-CM | POA: Diagnosis not present

## 2013-09-19 DIAGNOSIS — M25569 Pain in unspecified knee: Secondary | ICD-10-CM | POA: Diagnosis not present

## 2013-09-19 DIAGNOSIS — R269 Unspecified abnormalities of gait and mobility: Secondary | ICD-10-CM | POA: Diagnosis not present

## 2013-09-19 DIAGNOSIS — R55 Syncope and collapse: Secondary | ICD-10-CM | POA: Diagnosis not present

## 2013-09-21 ENCOUNTER — Ambulatory Visit
Admission: RE | Admit: 2013-09-21 | Discharge: 2013-09-21 | Disposition: A | Discharge status: Home / Self Care | Payer: Medicare Other | Source: Ambulatory Visit | Attending: Neurological Surgery | Admitting: Neurological Surgery

## 2013-09-21 ENCOUNTER — Other Ambulatory Visit: Payer: Self-pay | Admitting: Neurological Surgery

## 2013-09-21 DIAGNOSIS — M542 Cervicalgia: Secondary | ICD-10-CM

## 2013-09-21 DIAGNOSIS — S0993XA Unspecified injury of face, initial encounter: Secondary | ICD-10-CM | POA: Diagnosis not present

## 2013-09-28 DIAGNOSIS — M542 Cervicalgia: Secondary | ICD-10-CM | POA: Diagnosis not present

## 2013-10-18 DIAGNOSIS — M542 Cervicalgia: Secondary | ICD-10-CM | POA: Diagnosis not present

## 2013-10-18 DIAGNOSIS — R51 Headache: Secondary | ICD-10-CM | POA: Diagnosis not present

## 2013-10-18 DIAGNOSIS — M62838 Other muscle spasm: Secondary | ICD-10-CM | POA: Diagnosis not present

## 2013-10-18 DIAGNOSIS — S069X9A Unspecified intracranial injury with loss of consciousness of unspecified duration, initial encounter: Secondary | ICD-10-CM | POA: Diagnosis not present

## 2013-10-30 ENCOUNTER — Other Ambulatory Visit: Payer: Self-pay | Admitting: Neurology

## 2013-10-31 ENCOUNTER — Ambulatory Visit
Admission: RE | Admit: 2013-10-31 | Discharge: 2013-10-31 | Disposition: A | Discharge status: Home / Self Care | Payer: Medicare Other | Source: Ambulatory Visit | Attending: Neurology | Admitting: Neurology

## 2013-10-31 DIAGNOSIS — S0990XA Unspecified injury of head, initial encounter: Secondary | ICD-10-CM | POA: Diagnosis not present

## 2013-11-09 ENCOUNTER — Other Ambulatory Visit: Payer: Self-pay | Admitting: Neurology

## 2013-11-09 DIAGNOSIS — I639 Cerebral infarction, unspecified: Secondary | ICD-10-CM

## 2013-11-13 ENCOUNTER — Ambulatory Visit
Admission: RE | Admit: 2013-11-13 | Discharge: 2013-11-13 | Disposition: A | Discharge status: Home / Self Care | Payer: Medicare Other | Source: Ambulatory Visit | Attending: Neurology | Admitting: Neurology

## 2013-11-13 DIAGNOSIS — I639 Cerebral infarction, unspecified: Secondary | ICD-10-CM

## 2013-11-13 DIAGNOSIS — M545 Low back pain, unspecified: Secondary | ICD-10-CM | POA: Diagnosis not present

## 2013-11-13 DIAGNOSIS — I658 Occlusion and stenosis of other precerebral arteries: Secondary | ICD-10-CM | POA: Diagnosis not present

## 2013-11-18 DIAGNOSIS — IMO0002 Reserved for concepts with insufficient information to code with codable children: Secondary | ICD-10-CM | POA: Diagnosis not present

## 2013-11-18 DIAGNOSIS — R209 Unspecified disturbances of skin sensation: Secondary | ICD-10-CM | POA: Diagnosis not present

## 2013-11-18 DIAGNOSIS — R21 Rash and other nonspecific skin eruption: Secondary | ICD-10-CM | POA: Diagnosis not present

## 2013-11-23 DIAGNOSIS — R059 Cough, unspecified: Secondary | ICD-10-CM | POA: Diagnosis not present

## 2013-11-23 DIAGNOSIS — R109 Unspecified abdominal pain: Secondary | ICD-10-CM | POA: Diagnosis not present

## 2013-11-23 DIAGNOSIS — B029 Zoster without complications: Secondary | ICD-10-CM | POA: Diagnosis not present

## 2013-11-23 DIAGNOSIS — R05 Cough: Secondary | ICD-10-CM | POA: Diagnosis not present

## 2013-11-27 DIAGNOSIS — R930 Abnormal findings on diagnostic imaging of skull and head, not elsewhere classified: Secondary | ICD-10-CM | POA: Diagnosis not present

## 2013-11-27 DIAGNOSIS — G4489 Other headache syndrome: Secondary | ICD-10-CM | POA: Diagnosis not present

## 2013-11-27 DIAGNOSIS — M542 Cervicalgia: Secondary | ICD-10-CM | POA: Diagnosis not present

## 2013-11-30 DIAGNOSIS — B029 Zoster without complications: Secondary | ICD-10-CM | POA: Diagnosis not present

## 2014-01-16 DIAGNOSIS — R599 Enlarged lymph nodes, unspecified: Secondary | ICD-10-CM | POA: Diagnosis not present

## 2014-01-16 DIAGNOSIS — J020 Streptococcal pharyngitis: Secondary | ICD-10-CM | POA: Diagnosis not present

## 2014-01-23 DIAGNOSIS — H539 Unspecified visual disturbance: Secondary | ICD-10-CM | POA: Diagnosis not present

## 2014-01-23 DIAGNOSIS — H04129 Dry eye syndrome of unspecified lacrimal gland: Secondary | ICD-10-CM | POA: Diagnosis not present

## 2014-01-23 DIAGNOSIS — H43399 Other vitreous opacities, unspecified eye: Secondary | ICD-10-CM | POA: Diagnosis not present

## 2014-01-23 DIAGNOSIS — E119 Type 2 diabetes mellitus without complications: Secondary | ICD-10-CM | POA: Diagnosis not present

## 2014-01-29 DIAGNOSIS — R221 Localized swelling, mass and lump, neck: Secondary | ICD-10-CM | POA: Diagnosis not present

## 2014-01-29 DIAGNOSIS — R220 Localized swelling, mass and lump, head: Secondary | ICD-10-CM | POA: Diagnosis not present

## 2014-01-29 DIAGNOSIS — R070 Pain in throat: Secondary | ICD-10-CM | POA: Diagnosis not present

## 2014-01-31 ENCOUNTER — Other Ambulatory Visit: Payer: Self-pay | Admitting: Otolaryngology

## 2014-01-31 DIAGNOSIS — R221 Localized swelling, mass and lump, neck: Secondary | ICD-10-CM

## 2014-02-05 ENCOUNTER — Ambulatory Visit
Admission: RE | Admit: 2014-02-05 | Discharge: 2014-02-05 | Disposition: A | Discharge status: Home / Self Care | Payer: Medicare Other | Source: Ambulatory Visit | Attending: Otolaryngology | Admitting: Otolaryngology

## 2014-02-05 DIAGNOSIS — R221 Localized swelling, mass and lump, neck: Secondary | ICD-10-CM | POA: Diagnosis not present

## 2014-02-05 DIAGNOSIS — R220 Localized swelling, mass and lump, head: Secondary | ICD-10-CM | POA: Diagnosis not present

## 2014-02-05 MED ORDER — IOHEXOL 300 MG/ML  SOLN
75.0000 mL | Freq: Once | INTRAMUSCULAR | Status: AC | PRN
  Administered 2014-02-05: 75 mL via INTRAVENOUS

## 2014-02-06 DIAGNOSIS — H02839 Dermatochalasis of unspecified eye, unspecified eyelid: Secondary | ICD-10-CM | POA: Diagnosis not present

## 2014-02-06 DIAGNOSIS — H539 Unspecified visual disturbance: Secondary | ICD-10-CM | POA: Diagnosis not present

## 2014-02-06 DIAGNOSIS — Z961 Presence of intraocular lens: Secondary | ICD-10-CM | POA: Diagnosis not present

## 2014-02-06 DIAGNOSIS — H43399 Other vitreous opacities, unspecified eye: Secondary | ICD-10-CM | POA: Diagnosis not present

## 2014-02-14 DIAGNOSIS — I10 Essential (primary) hypertension: Secondary | ICD-10-CM | POA: Diagnosis not present

## 2014-02-14 DIAGNOSIS — E559 Vitamin D deficiency, unspecified: Secondary | ICD-10-CM | POA: Diagnosis not present

## 2014-02-14 DIAGNOSIS — Z23 Encounter for immunization: Secondary | ICD-10-CM | POA: Diagnosis not present

## 2014-02-14 DIAGNOSIS — Z0000 Encounter for general adult medical examination without abnormal findings: Secondary | ICD-10-CM | POA: Diagnosis not present

## 2014-02-14 DIAGNOSIS — E039 Hypothyroidism, unspecified: Secondary | ICD-10-CM | POA: Diagnosis not present

## 2014-02-14 DIAGNOSIS — Z1331 Encounter for screening for depression: Secondary | ICD-10-CM | POA: Diagnosis not present

## 2014-02-14 DIAGNOSIS — E1149 Type 2 diabetes mellitus with other diabetic neurological complication: Secondary | ICD-10-CM | POA: Diagnosis not present

## 2014-02-21 ENCOUNTER — Other Ambulatory Visit: Payer: Self-pay | Admitting: Internal Medicine

## 2014-02-21 DIAGNOSIS — E039 Hypothyroidism, unspecified: Secondary | ICD-10-CM | POA: Diagnosis not present

## 2014-02-21 DIAGNOSIS — R740 Nonspecific elevation of levels of transaminase and lactic acid dehydrogenase [LDH]: Principal | ICD-10-CM

## 2014-02-21 DIAGNOSIS — R7989 Other specified abnormal findings of blood chemistry: Secondary | ICD-10-CM

## 2014-02-21 DIAGNOSIS — R945 Abnormal results of liver function studies: Secondary | ICD-10-CM

## 2014-02-21 DIAGNOSIS — I10 Essential (primary) hypertension: Secondary | ICD-10-CM | POA: Diagnosis not present

## 2014-02-21 DIAGNOSIS — R7402 Elevation of levels of lactic acid dehydrogenase (LDH): Secondary | ICD-10-CM

## 2014-02-21 DIAGNOSIS — E1149 Type 2 diabetes mellitus with other diabetic neurological complication: Secondary | ICD-10-CM | POA: Diagnosis not present

## 2014-02-21 DIAGNOSIS — G609 Hereditary and idiopathic neuropathy, unspecified: Secondary | ICD-10-CM | POA: Diagnosis not present

## 2014-02-21 DIAGNOSIS — R109 Unspecified abdominal pain: Secondary | ICD-10-CM

## 2014-02-21 DIAGNOSIS — E785 Hyperlipidemia, unspecified: Secondary | ICD-10-CM | POA: Diagnosis not present

## 2014-02-26 ENCOUNTER — Ambulatory Visit
Admission: RE | Admit: 2014-02-26 | Discharge: 2014-02-26 | Disposition: A | Discharge status: Home / Self Care | Payer: Medicare Other | Source: Ambulatory Visit | Attending: Internal Medicine | Admitting: Internal Medicine

## 2014-02-26 DIAGNOSIS — R7401 Elevation of levels of liver transaminase levels: Secondary | ICD-10-CM

## 2014-02-26 DIAGNOSIS — K7689 Other specified diseases of liver: Secondary | ICD-10-CM | POA: Diagnosis not present

## 2014-02-26 DIAGNOSIS — R109 Unspecified abdominal pain: Secondary | ICD-10-CM

## 2014-02-26 DIAGNOSIS — R740 Nonspecific elevation of levels of transaminase and lactic acid dehydrogenase [LDH]: Principal | ICD-10-CM

## 2014-02-28 DIAGNOSIS — Z1231 Encounter for screening mammogram for malignant neoplasm of breast: Secondary | ICD-10-CM | POA: Diagnosis not present

## 2014-03-08 DIAGNOSIS — R059 Cough, unspecified: Secondary | ICD-10-CM | POA: Diagnosis not present

## 2014-03-08 DIAGNOSIS — R05 Cough: Secondary | ICD-10-CM | POA: Diagnosis not present

## 2014-03-08 DIAGNOSIS — J309 Allergic rhinitis, unspecified: Secondary | ICD-10-CM | POA: Diagnosis not present

## 2014-04-09 DIAGNOSIS — M79609 Pain in unspecified limb: Secondary | ICD-10-CM | POA: Diagnosis not present

## 2014-04-09 DIAGNOSIS — M659 Synovitis and tenosynovitis, unspecified: Secondary | ICD-10-CM | POA: Diagnosis not present

## 2014-04-16 DIAGNOSIS — M6281 Muscle weakness (generalized): Secondary | ICD-10-CM | POA: Diagnosis not present

## 2014-04-16 DIAGNOSIS — M25673 Stiffness of unspecified ankle, not elsewhere classified: Secondary | ICD-10-CM | POA: Diagnosis not present

## 2014-04-16 DIAGNOSIS — M25579 Pain in unspecified ankle and joints of unspecified foot: Secondary | ICD-10-CM | POA: Diagnosis not present

## 2014-04-16 DIAGNOSIS — M25676 Stiffness of unspecified foot, not elsewhere classified: Secondary | ICD-10-CM | POA: Diagnosis not present

## 2014-04-16 DIAGNOSIS — M7660 Achilles tendinitis, unspecified leg: Secondary | ICD-10-CM | POA: Diagnosis not present

## 2014-04-16 DIAGNOSIS — R269 Unspecified abnormalities of gait and mobility: Secondary | ICD-10-CM | POA: Diagnosis not present

## 2014-04-16 DIAGNOSIS — R29818 Other symptoms and signs involving the nervous system: Secondary | ICD-10-CM | POA: Diagnosis not present

## 2014-04-16 DIAGNOSIS — M79609 Pain in unspecified limb: Secondary | ICD-10-CM | POA: Diagnosis not present

## 2014-04-16 DIAGNOSIS — M259 Joint disorder, unspecified: Secondary | ICD-10-CM | POA: Diagnosis not present

## 2014-04-16 DIAGNOSIS — IMO0001 Reserved for inherently not codable concepts without codable children: Secondary | ICD-10-CM | POA: Diagnosis not present

## 2014-04-22 DIAGNOSIS — M25673 Stiffness of unspecified ankle, not elsewhere classified: Secondary | ICD-10-CM | POA: Diagnosis not present

## 2014-04-22 DIAGNOSIS — L905 Scar conditions and fibrosis of skin: Secondary | ICD-10-CM | POA: Diagnosis not present

## 2014-04-22 DIAGNOSIS — IMO0001 Reserved for inherently not codable concepts without codable children: Secondary | ICD-10-CM | POA: Diagnosis not present

## 2014-04-22 DIAGNOSIS — M25676 Stiffness of unspecified foot, not elsewhere classified: Secondary | ICD-10-CM | POA: Diagnosis not present

## 2014-04-22 DIAGNOSIS — M7660 Achilles tendinitis, unspecified leg: Secondary | ICD-10-CM | POA: Diagnosis not present

## 2014-04-22 DIAGNOSIS — M25579 Pain in unspecified ankle and joints of unspecified foot: Secondary | ICD-10-CM | POA: Diagnosis not present

## 2014-04-22 DIAGNOSIS — R29818 Other symptoms and signs involving the nervous system: Secondary | ICD-10-CM | POA: Diagnosis not present

## 2014-04-22 DIAGNOSIS — M259 Joint disorder, unspecified: Secondary | ICD-10-CM | POA: Diagnosis not present

## 2014-04-22 DIAGNOSIS — M79609 Pain in unspecified limb: Secondary | ICD-10-CM | POA: Diagnosis not present

## 2014-04-22 DIAGNOSIS — M6281 Muscle weakness (generalized): Secondary | ICD-10-CM | POA: Diagnosis not present

## 2014-04-22 DIAGNOSIS — R269 Unspecified abnormalities of gait and mobility: Secondary | ICD-10-CM | POA: Diagnosis not present

## 2014-04-24 DIAGNOSIS — H43399 Other vitreous opacities, unspecified eye: Secondary | ICD-10-CM | POA: Diagnosis not present

## 2014-04-24 DIAGNOSIS — H539 Unspecified visual disturbance: Secondary | ICD-10-CM | POA: Diagnosis not present

## 2014-04-24 DIAGNOSIS — H02839 Dermatochalasis of unspecified eye, unspecified eyelid: Secondary | ICD-10-CM | POA: Diagnosis not present

## 2014-04-24 DIAGNOSIS — Z961 Presence of intraocular lens: Secondary | ICD-10-CM | POA: Diagnosis not present

## 2014-04-25 DIAGNOSIS — IMO0001 Reserved for inherently not codable concepts without codable children: Secondary | ICD-10-CM | POA: Diagnosis not present

## 2014-04-25 DIAGNOSIS — M25579 Pain in unspecified ankle and joints of unspecified foot: Secondary | ICD-10-CM | POA: Diagnosis not present

## 2014-04-25 DIAGNOSIS — M79609 Pain in unspecified limb: Secondary | ICD-10-CM | POA: Diagnosis not present

## 2014-04-25 DIAGNOSIS — M7660 Achilles tendinitis, unspecified leg: Secondary | ICD-10-CM | POA: Diagnosis not present

## 2014-04-26 DIAGNOSIS — J309 Allergic rhinitis, unspecified: Secondary | ICD-10-CM | POA: Diagnosis not present

## 2014-04-29 DIAGNOSIS — M7660 Achilles tendinitis, unspecified leg: Secondary | ICD-10-CM | POA: Diagnosis not present

## 2014-04-29 DIAGNOSIS — M25579 Pain in unspecified ankle and joints of unspecified foot: Secondary | ICD-10-CM | POA: Diagnosis not present

## 2014-04-29 DIAGNOSIS — IMO0001 Reserved for inherently not codable concepts without codable children: Secondary | ICD-10-CM | POA: Diagnosis not present

## 2014-04-29 DIAGNOSIS — M79609 Pain in unspecified limb: Secondary | ICD-10-CM | POA: Diagnosis not present

## 2014-05-01 DIAGNOSIS — M65979 Unspecified synovitis and tenosynovitis, unspecified ankle and foot: Secondary | ICD-10-CM | POA: Diagnosis not present

## 2014-05-01 DIAGNOSIS — M7150 Other bursitis, not elsewhere classified, unspecified site: Secondary | ICD-10-CM | POA: Diagnosis not present

## 2014-05-01 DIAGNOSIS — M659 Synovitis and tenosynovitis, unspecified: Secondary | ICD-10-CM | POA: Diagnosis not present

## 2014-05-01 DIAGNOSIS — M7660 Achilles tendinitis, unspecified leg: Secondary | ICD-10-CM | POA: Diagnosis not present

## 2014-05-06 DIAGNOSIS — M7660 Achilles tendinitis, unspecified leg: Secondary | ICD-10-CM | POA: Diagnosis not present

## 2014-05-06 DIAGNOSIS — M25579 Pain in unspecified ankle and joints of unspecified foot: Secondary | ICD-10-CM | POA: Diagnosis not present

## 2014-05-06 DIAGNOSIS — M79609 Pain in unspecified limb: Secondary | ICD-10-CM | POA: Diagnosis not present

## 2014-05-06 DIAGNOSIS — IMO0001 Reserved for inherently not codable concepts without codable children: Secondary | ICD-10-CM | POA: Diagnosis not present

## 2014-05-30 DIAGNOSIS — M5412 Radiculopathy, cervical region: Secondary | ICD-10-CM | POA: Diagnosis not present

## 2014-05-30 DIAGNOSIS — G5610 Other lesions of median nerve, unspecified upper limb: Secondary | ICD-10-CM | POA: Diagnosis not present

## 2014-05-30 DIAGNOSIS — R209 Unspecified disturbances of skin sensation: Secondary | ICD-10-CM | POA: Diagnosis not present

## 2014-05-30 DIAGNOSIS — G3184 Mild cognitive impairment, so stated: Secondary | ICD-10-CM | POA: Diagnosis not present

## 2014-05-30 DIAGNOSIS — G609 Hereditary and idiopathic neuropathy, unspecified: Secondary | ICD-10-CM | POA: Diagnosis not present

## 2014-05-30 DIAGNOSIS — IMO0002 Reserved for concepts with insufficient information to code with codable children: Secondary | ICD-10-CM | POA: Diagnosis not present

## 2014-05-30 DIAGNOSIS — R634 Abnormal weight loss: Secondary | ICD-10-CM | POA: Diagnosis not present

## 2014-06-12 DIAGNOSIS — IMO0002 Reserved for concepts with insufficient information to code with codable children: Secondary | ICD-10-CM | POA: Diagnosis not present

## 2014-06-19 DIAGNOSIS — M549 Dorsalgia, unspecified: Secondary | ICD-10-CM | POA: Diagnosis not present

## 2014-06-19 DIAGNOSIS — IMO0002 Reserved for concepts with insufficient information to code with codable children: Secondary | ICD-10-CM | POA: Diagnosis not present

## 2014-06-19 DIAGNOSIS — G3184 Mild cognitive impairment, so stated: Secondary | ICD-10-CM | POA: Diagnosis not present

## 2014-06-19 DIAGNOSIS — M5412 Radiculopathy, cervical region: Secondary | ICD-10-CM | POA: Diagnosis not present

## 2014-06-19 DIAGNOSIS — M542 Cervicalgia: Secondary | ICD-10-CM | POA: Diagnosis not present

## 2014-07-25 DIAGNOSIS — M5412 Radiculopathy, cervical region: Secondary | ICD-10-CM | POA: Diagnosis not present

## 2014-07-25 DIAGNOSIS — G5610 Other lesions of median nerve, unspecified upper limb: Secondary | ICD-10-CM | POA: Diagnosis not present

## 2014-07-25 DIAGNOSIS — G609 Hereditary and idiopathic neuropathy, unspecified: Secondary | ICD-10-CM | POA: Diagnosis not present

## 2014-07-25 DIAGNOSIS — IMO0002 Reserved for concepts with insufficient information to code with codable children: Secondary | ICD-10-CM | POA: Diagnosis not present

## 2014-07-25 DIAGNOSIS — G3184 Mild cognitive impairment, so stated: Secondary | ICD-10-CM | POA: Diagnosis not present

## 2014-08-16 DIAGNOSIS — E559 Vitamin D deficiency, unspecified: Secondary | ICD-10-CM | POA: Diagnosis not present

## 2014-08-16 DIAGNOSIS — E13610 Other specified diabetes mellitus with diabetic neuropathic arthropathy: Secondary | ICD-10-CM | POA: Diagnosis not present

## 2014-08-16 DIAGNOSIS — E039 Hypothyroidism, unspecified: Secondary | ICD-10-CM | POA: Diagnosis not present

## 2014-08-16 DIAGNOSIS — I10 Essential (primary) hypertension: Secondary | ICD-10-CM | POA: Diagnosis not present

## 2014-08-23 DIAGNOSIS — E13610 Other specified diabetes mellitus with diabetic neuropathic arthropathy: Secondary | ICD-10-CM | POA: Diagnosis not present

## 2014-08-23 DIAGNOSIS — Z23 Encounter for immunization: Secondary | ICD-10-CM | POA: Diagnosis not present

## 2014-08-23 DIAGNOSIS — E785 Hyperlipidemia, unspecified: Secondary | ICD-10-CM | POA: Diagnosis not present

## 2014-08-23 DIAGNOSIS — G609 Hereditary and idiopathic neuropathy, unspecified: Secondary | ICD-10-CM | POA: Diagnosis not present

## 2014-08-23 DIAGNOSIS — I509 Heart failure, unspecified: Secondary | ICD-10-CM | POA: Diagnosis not present

## 2014-09-05 DIAGNOSIS — M542 Cervicalgia: Secondary | ICD-10-CM | POA: Diagnosis not present

## 2014-09-05 DIAGNOSIS — M5417 Radiculopathy, lumbosacral region: Secondary | ICD-10-CM | POA: Diagnosis not present

## 2014-09-05 DIAGNOSIS — G3184 Mild cognitive impairment, so stated: Secondary | ICD-10-CM | POA: Diagnosis not present

## 2014-09-05 DIAGNOSIS — M545 Low back pain: Secondary | ICD-10-CM | POA: Diagnosis not present

## 2014-09-05 DIAGNOSIS — M5412 Radiculopathy, cervical region: Secondary | ICD-10-CM | POA: Diagnosis not present

## 2014-11-22 DIAGNOSIS — E559 Vitamin D deficiency, unspecified: Secondary | ICD-10-CM | POA: Diagnosis not present

## 2014-11-22 DIAGNOSIS — I10 Essential (primary) hypertension: Secondary | ICD-10-CM | POA: Diagnosis not present

## 2014-11-22 DIAGNOSIS — E13610 Other specified diabetes mellitus with diabetic neuropathic arthropathy: Secondary | ICD-10-CM | POA: Diagnosis not present

## 2014-12-10 DIAGNOSIS — I10 Essential (primary) hypertension: Secondary | ICD-10-CM | POA: Diagnosis not present

## 2014-12-10 DIAGNOSIS — E785 Hyperlipidemia, unspecified: Secondary | ICD-10-CM | POA: Diagnosis not present

## 2014-12-10 DIAGNOSIS — G609 Hereditary and idiopathic neuropathy, unspecified: Secondary | ICD-10-CM | POA: Diagnosis not present

## 2014-12-10 DIAGNOSIS — E039 Hypothyroidism, unspecified: Secondary | ICD-10-CM | POA: Diagnosis not present

## 2014-12-18 DIAGNOSIS — Z961 Presence of intraocular lens: Secondary | ICD-10-CM | POA: Diagnosis not present

## 2014-12-18 DIAGNOSIS — Z9849 Cataract extraction status, unspecified eye: Secondary | ICD-10-CM | POA: Diagnosis not present

## 2014-12-18 DIAGNOSIS — E119 Type 2 diabetes mellitus without complications: Secondary | ICD-10-CM | POA: Diagnosis not present

## 2015-01-21 ENCOUNTER — Institutional Professional Consult (permissible substitution): Payer: Medicare Other | Admitting: Pulmonary Disease

## 2015-02-12 DIAGNOSIS — K146 Glossodynia: Secondary | ICD-10-CM | POA: Diagnosis not present

## 2015-02-12 DIAGNOSIS — I5032 Chronic diastolic (congestive) heart failure: Secondary | ICD-10-CM | POA: Diagnosis not present

## 2015-02-12 DIAGNOSIS — E1140 Type 2 diabetes mellitus with diabetic neuropathy, unspecified: Secondary | ICD-10-CM | POA: Diagnosis not present

## 2015-03-03 ENCOUNTER — Ambulatory Visit (INDEPENDENT_AMBULATORY_CARE_PROVIDER_SITE_OTHER): Payer: Medicare Other | Admitting: Pulmonary Disease

## 2015-03-03 ENCOUNTER — Encounter: Payer: Self-pay | Admitting: Pulmonary Disease

## 2015-03-03 VITALS — BP 158/90 | HR 77 | Ht 65.0 in | Wt 233.2 lb

## 2015-03-03 DIAGNOSIS — G4730 Sleep apnea, unspecified: Secondary | ICD-10-CM | POA: Diagnosis not present

## 2015-03-03 NOTE — Patient Instructions (Signed)
I will review CPAP download report If OK, we will check sleep study to find out the cause of your sleepiness

## 2015-03-03 NOTE — Assessment & Plan Note (Signed)
Her cause of hypersomnolence is not clear to me at this time. We will review his CPAP download report-to ensure adequate pressure. 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 If CPAP pressure is adequate, then we will proceed with another CPAP titration study followed by MSL T to document objective sleepiness. I doubt that she is a candidate for stimulants such as modafinil. Meanwhile I have encouraged her to keep a sleep diary and to nap for 1 hour in the afternoons with CPAP and with an alarm clock  Weight loss encouraged, compliance with goal of at least 4-6 hrs every night is the expectation. Advised against medications with sedative side effects Cautioned against driving when sleepy - understanding that sleepiness will vary on a day to day basis

## 2015-03-03 NOTE — Progress Notes (Signed)
Subjective:    Patient ID: Megan Horton, female    DOB: Jun 05, 1941, 74 y.o.   MRN: [id number]  HPI  Chief Complaint  Patient presents with  . sleep consult    referred by [facility]; sleeping a lot, always wanting to go to sleep even during the day.  Epworth Score: 54   74 year old never smoker presents for evaluation of excessive daytime somnolence. Epworth sleepiness score is 9. She report sleepiness and radius social situations. She gets up at 11 AM, is unable to wake up with the alarm clock- finishes her house chores and goes back to sleep. After dinner she gets into her chair and dozes off. Bedtime is about 10 PM, sleep latency varies from 10 minutes to an hour, reports one nocturnal awakening, without post void latency, and is out of bed by 11 AM. She reports falls 3 over the past 2 years, she has seen a neurologist and neurosurgeon. Head CT 10/2013 was normal CT neck 01/2014 showed an enlarged left thyroid and mildly enlarged mediastinal lymph nodes. 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 She underwent evaluation for headache by Dr. Catalina Gravel had a headache clinic. PSG in 2009 in [facility] showed moderate OSA, and she is maintained on C Pap 15 cm with C-Flex 2 cm-DME is [facility] out of [employer]. She drinks 3-4 cups of coffee daily Increased dose of Neurontin made her sleepy-but now she has cut down to 300 mg at bedtime TSH was 4.4 in 08/2014 and 3.9 and 11/2014  Past Medical History  Diagnosis Date  . Hypertension   . Peripheral vascular disease   . Peripheral neuropathy   . Seasonal allergies   . Diabetes mellitus   . Hypothyroidism   . GERD (gastroesophageal reflux disease)   . Arthritis   . Sleep apnea     CPAP, sleep study 3-4 years ago in [place], [place] Dr. Elbert Ewings   . Wears glasses   . Adhesive capsulitis of right shoulder   . Partial tear of rotator cuff(726.13)   . Complication of anesthesia 5/13    did have some disorientation post cerv fusion due to  low sats  . Left knee DJD   . Seasonal allergies   . Urgency of urination   . Hyperlipemia      Past Surgical History  Procedure Laterality Date  . Abdominal hysterectomy    . Cholecystectomy  2009  . Hemorrhoid surgery  2009  . Knee arthroscopy      Left x2  . Carpal tunnel release      bilat  . Cervical fusion    . Tonsillectomy    . Varicose vein surgery      Right  . Heel spur excision      bilat  . Nose surgery    . Anterior cervical decomp/discectomy fusion  03/10/2012    Procedure: ANTERIOR CERVICAL DECOMPRESSION/DISCECTOMY FUSION 1 LEVEL;  Surgeon: Eustace Moore, MD;  Location: [facility];  Service: Neurosurgery;  Laterality: N/A;  Cervical five-six anterior cervical decompression fusion with plating  . Eye surgery  2012    bilat cataract  . Shoulder arthroscopy  06/27/2012    Procedure: ARTHROSCOPY SHOULDER;  Surgeon: Lorn Junes, MD;  Location: [facility];  Service: Orthopedics;  Laterality: Right;  right shoulder arthroscopy debridement extensive, distal clavulectomy, with resect adhesions with maniplulation  . Colonoscopy w/ polypectomy    . Total knee arthroplasty Left 07/02/2013    Procedure: TOTAL KNEE ARTHROPLASTY- left;  Surgeon: Audree Camel  Noemi Chapel, MD;  Location: [facility];  Service: Orthopedics;  Laterality: Left;     Allergies  Allergen Reactions  . Asa [Aspirin]     GERD  . Adhesive [Tape] Other (See Comments)    blister  . Tylenol [Acetaminophen] Nausea And Vomiting    Aggravates acid reflux    History   Social History  . Marital Status: Widowed    Spouse Name: N/A  . Number of Children: N/A  . Years of Education: N/A   Occupational History  . retired    Social History Main Topics  . Smoking status: Former Smoker -- 0.25 packs/day for 16 years    Types: Cigarettes    Quit date: 12/26/1993  . Smokeless tobacco: Never Used  . Alcohol Use: Yes     Comment: very rarely wine  . Drug Use: No  . Sexual Activity: Not on file    Other Topics Concern  . Not on file   Social History Narrative    Family History  Problem Relation Age of Onset  . Heart disease Mother   . Heart disease Father   . Heart disease Brother   . Heart attack Father   . Heart attack Brother   . Heart attack Sister   . Hypertension Mother   . Hypertension Father   . Hypertension Sister   . Stroke Mother   . Stroke Brother       Review of Systems neg for any significant sore throat, dysphagia, itching, sneezing, nasal congestion or excess/ purulent secretions, fever, chills, sweats, unintended wt loss, pleuritic or exertional cp, hempoptysis, orthopnea pnd or change in chronic leg swelling. Also denies presyncope, palpitations, heartburn, abdominal pain, nausea, vomiting, diarrhea or change in bowel or urinary habits, dysuria,hematuria, rash, arthralgias, visual complaints, headache, numbness weakness or ataxia.     Objective:   Physical Exam  Gen. Pleasant, obese, in no distress, normal affect ENT - no lesions, no post nasal drip, class 2-3 airway Neck: No JVD, no thyromegaly, no carotid bruits Lungs: no use of accessory muscles, no dullness to percussion, decreased without rales or rhonchi  Cardiovascular: Rhythm regular, heart sounds  normal, no murmurs or gallops, no peripheral edema Abdomen: soft and non-tender, no hepatosplenomegaly, BS normal. Musculoskeletal: No deformities, no cyanosis or clubbing Neuro:  alert, non focal, no tremors       Assessment & Plan:

## 2015-03-03 NOTE — Progress Notes (Signed)
   Subjective:    Patient ID: Megan Horton, female    DOB: 11-11-1940, 74 y.o.   MRN: [id number]  HPI    Review of Systems  Constitutional: Negative for fever, chills and unexpected weight change.  HENT: Negative for congestion, dental problem, ear pain, nosebleeds, postnasal drip, rhinorrhea, sinus pressure, sneezing, sore throat, trouble swallowing and voice change.   Eyes: Negative for visual disturbance.  Respiratory: Negative for cough, choking and shortness of breath.   Cardiovascular: Negative for chest pain and leg swelling.  Gastrointestinal: Negative for vomiting, abdominal pain and diarrhea.  Genitourinary: Negative for difficulty urinating.  Musculoskeletal: Negative for arthralgias.  Skin: Negative for rash.  Neurological: Negative for tremors, syncope and headaches.  Hematological: Does not bruise/bleed easily.       Objective:   Physical Exam        Assessment & Plan:

## 2015-03-05 ENCOUNTER — Telehealth: Payer: Self-pay | Admitting: Pulmonary Disease

## 2015-03-05 DIAGNOSIS — Z1231 Encounter for screening mammogram for malignant neoplasm of breast: Secondary | ICD-10-CM | POA: Diagnosis not present

## 2015-03-05 NOTE — Telephone Encounter (Signed)
07/2009 PSG AHI 6/h, lowest desatn 81% Corrected by CPAP 14 cm

## 2015-03-11 DIAGNOSIS — Z23 Encounter for immunization: Secondary | ICD-10-CM | POA: Diagnosis not present

## 2015-03-11 DIAGNOSIS — Z1389 Encounter for screening for other disorder: Secondary | ICD-10-CM | POA: Diagnosis not present

## 2015-03-11 DIAGNOSIS — I10 Essential (primary) hypertension: Secondary | ICD-10-CM | POA: Diagnosis not present

## 2015-03-11 DIAGNOSIS — E039 Hypothyroidism, unspecified: Secondary | ICD-10-CM | POA: Diagnosis not present

## 2015-03-11 DIAGNOSIS — E1121 Type 2 diabetes mellitus with diabetic nephropathy: Secondary | ICD-10-CM | POA: Diagnosis not present

## 2015-03-11 DIAGNOSIS — M8580 Other specified disorders of bone density and structure, unspecified site: Secondary | ICD-10-CM | POA: Diagnosis not present

## 2015-03-11 DIAGNOSIS — E559 Vitamin D deficiency, unspecified: Secondary | ICD-10-CM | POA: Diagnosis not present

## 2015-03-11 DIAGNOSIS — Z0000 Encounter for general adult medical examination without abnormal findings: Secondary | ICD-10-CM | POA: Diagnosis not present

## 2015-03-18 DIAGNOSIS — E785 Hyperlipidemia, unspecified: Secondary | ICD-10-CM | POA: Diagnosis not present

## 2015-03-18 DIAGNOSIS — I10 Essential (primary) hypertension: Secondary | ICD-10-CM | POA: Diagnosis not present

## 2015-03-18 DIAGNOSIS — E1122 Type 2 diabetes mellitus with diabetic chronic kidney disease: Secondary | ICD-10-CM | POA: Diagnosis not present

## 2015-03-18 DIAGNOSIS — N182 Chronic kidney disease, stage 2 (mild): Secondary | ICD-10-CM | POA: Diagnosis not present

## 2015-03-18 DIAGNOSIS — E1140 Type 2 diabetes mellitus with diabetic neuropathy, unspecified: Secondary | ICD-10-CM | POA: Diagnosis not present

## 2015-04-09 DIAGNOSIS — J449 Chronic obstructive pulmonary disease, unspecified: Secondary | ICD-10-CM | POA: Diagnosis not present

## 2015-04-09 DIAGNOSIS — Z9109 Other allergy status, other than to drugs and biological substances: Secondary | ICD-10-CM | POA: Diagnosis not present

## 2015-04-09 DIAGNOSIS — E079 Disorder of thyroid, unspecified: Secondary | ICD-10-CM | POA: Diagnosis not present

## 2015-04-09 DIAGNOSIS — M47812 Spondylosis without myelopathy or radiculopathy, cervical region: Secondary | ICD-10-CM | POA: Diagnosis not present

## 2015-04-09 DIAGNOSIS — S0990XA Unspecified injury of head, initial encounter: Secondary | ICD-10-CM | POA: Diagnosis not present

## 2015-04-09 DIAGNOSIS — W01198A Fall on same level from slipping, tripping and stumbling with subsequent striking against other object, initial encounter: Secondary | ICD-10-CM | POA: Diagnosis not present

## 2015-04-09 DIAGNOSIS — S199XXA Unspecified injury of neck, initial encounter: Secondary | ICD-10-CM | POA: Diagnosis not present

## 2015-04-09 DIAGNOSIS — E785 Hyperlipidemia, unspecified: Secondary | ICD-10-CM | POA: Diagnosis not present

## 2015-04-09 DIAGNOSIS — S098XXA Other specified injuries of head, initial encounter: Secondary | ICD-10-CM | POA: Diagnosis not present

## 2015-04-09 DIAGNOSIS — E119 Type 2 diabetes mellitus without complications: Secondary | ICD-10-CM | POA: Diagnosis not present

## 2015-04-09 DIAGNOSIS — I10 Essential (primary) hypertension: Secondary | ICD-10-CM | POA: Diagnosis not present

## 2015-04-09 DIAGNOSIS — Z888 Allergy status to other drugs, medicaments and biological substances status: Secondary | ICD-10-CM | POA: Diagnosis not present

## 2015-04-09 DIAGNOSIS — Z79899 Other long term (current) drug therapy: Secondary | ICD-10-CM | POA: Diagnosis not present

## 2015-04-09 DIAGNOSIS — Z87891 Personal history of nicotine dependence: Secondary | ICD-10-CM | POA: Diagnosis not present

## 2015-04-10 ENCOUNTER — Other Ambulatory Visit: Payer: Self-pay | Admitting: Pulmonary Disease

## 2015-04-10 DIAGNOSIS — G4733 Obstructive sleep apnea (adult) (pediatric): Secondary | ICD-10-CM

## 2015-04-21 DIAGNOSIS — Z86010 Personal history of colonic polyps: Secondary | ICD-10-CM | POA: Diagnosis not present

## 2015-04-21 DIAGNOSIS — K5900 Constipation, unspecified: Secondary | ICD-10-CM | POA: Diagnosis not present

## 2015-04-21 DIAGNOSIS — K219 Gastro-esophageal reflux disease without esophagitis: Secondary | ICD-10-CM | POA: Diagnosis not present

## 2015-04-21 DIAGNOSIS — J45998 Other asthma: Secondary | ICD-10-CM | POA: Diagnosis not present

## 2015-04-22 ENCOUNTER — Other Ambulatory Visit: Payer: Self-pay | Admitting: Gastroenterology

## 2015-04-23 DIAGNOSIS — S90426A Blister (nonthermal), unspecified lesser toe(s), initial encounter: Secondary | ICD-10-CM | POA: Diagnosis not present

## 2015-04-23 DIAGNOSIS — W19XXXA Unspecified fall, initial encounter: Secondary | ICD-10-CM | POA: Diagnosis not present

## 2015-04-23 DIAGNOSIS — S0990XA Unspecified injury of head, initial encounter: Secondary | ICD-10-CM | POA: Diagnosis not present

## 2015-05-07 ENCOUNTER — Ambulatory Visit: Payer: Medicare Other | Admitting: Pulmonary Disease

## 2015-05-19 ENCOUNTER — Encounter (HOSPITAL_COMMUNITY): Payer: Self-pay | Admitting: *Deleted

## 2015-05-22 NOTE — H&P (Signed)
Megan Horton HPI: At this time the patient denies any problems with nausea, vomiting, fevers, chills, abdominal pain, diarrhea, hematochezia, melena, or dysphagia. The patient denies any known family history of colon cancers. No complaints of chest pain, SOB, MI, or sleep apnea. She had a colonoscopy in [place] in 2009 or 2010 with findings of some polyps. Constipation is a problem for her, but she takes fiber and this addresses the situation. GERD is a problem for her and Dexilant controls her symptoms, but she will have regurgitation in the evenings if she eats after 6 PM.  Past Medical History  Diagnosis Date  . Hypertension   . Peripheral vascular disease   . Peripheral neuropathy   . Seasonal allergies   . Diabetes mellitus   . Hypothyroidism   . GERD (gastroesophageal reflux disease)   . Arthritis   . Sleep apnea     CPAP, sleep study 3-4 years ago in [place], [place] Dr. Elbert Ewings   . Wears glasses   . Adhesive capsulitis of right shoulder   . Partial tear of rotator cuff(726.13)   . Complication of anesthesia 5/13    did have some disorientation post cerv fusion due to low sats  . Left knee DJD   . Seasonal allergies   . Urgency of urination   . Hyperlipemia     Past Surgical History  Procedure Laterality Date  . Abdominal hysterectomy    . Cholecystectomy  2009  . Hemorrhoid surgery  2009  . Knee arthroscopy      Left x2  . Carpal tunnel release      bilat  . Cervical fusion    . Tonsillectomy    . Varicose vein surgery      Right  . Heel spur excision      bilat  . Nose surgery    . Anterior cervical decomp/discectomy fusion  03/10/2012    Procedure: ANTERIOR CERVICAL DECOMPRESSION/DISCECTOMY FUSION 1 LEVEL;  Surgeon: Eustace Moore, MD;  Location: [facility];  Service: Neurosurgery;  Laterality: N/A;  Cervical five-six anterior cervical decompression fusion with plating  . Eye surgery  2012    bilat cataract  . Shoulder arthroscopy  06/27/2012   Procedure: ARTHROSCOPY SHOULDER;  Surgeon: Lorn Junes, MD;  Location: [facility];  Service: Orthopedics;  Laterality: Right;  right shoulder arthroscopy debridement extensive, distal clavulectomy, with resect adhesions with maniplulation  . Colonoscopy w/ polypectomy    . Total knee arthroplasty Left 07/02/2013    Procedure: TOTAL KNEE ARTHROPLASTY- left;  Surgeon: Lorn Junes, MD;  Location: [facility];  Service: Orthopedics;  Laterality: Left;    Family History  Problem Relation Age of Onset  . Heart disease Mother   . Heart disease Father   . Heart disease Brother   . Heart attack Father   . Heart attack Brother   . Heart attack Sister   . Hypertension Mother   . Hypertension Father   . Hypertension Sister   . Stroke Mother   . Stroke Brother     Social History:  reports that she quit smoking about 21 years ago. Her smoking use included Cigarettes. She has a 4 pack-year smoking history. She has never used smokeless tobacco. She reports that she drinks alcohol. She reports that she does not use illicit drugs.  Allergies:  Allergies  Allergen Reactions  . Asa [Aspirin]     GERD  . Adhesive [Tape] Other (See Comments)    blister  . Tylenol [  Acetaminophen] Nausea And Vomiting    Aggravates acid reflux    Medications: Scheduled: Continuous:  No results found for this or any previous visit (from the past 24 hour(s)).   No results found.  ROS:  As stated above in the HPI otherwise negative.  There were no vitals taken for this visit.    PE: Gen: NAD, Alert and Oriented HEENT:  [place]/AT, EOMI Neck: Supple, no LAD Lungs: CTA Bilaterally CV: RRR without M/G/R ABM: Soft, NTND, +BS Ext: No C/C/E  Assessment/Plan: 1) Screening colonoscopy.  Shawon Denzer D 05/22/2015, 12:46 PM

## 2015-05-23 ENCOUNTER — Encounter (HOSPITAL_COMMUNITY)
Admission: RE | Disposition: A | Discharge status: Home / Self Care | Payer: Self-pay | Source: Ambulatory Visit | Attending: Gastroenterology

## 2015-05-23 ENCOUNTER — Ambulatory Visit (HOSPITAL_COMMUNITY)
Admission: RE | Admit: 2015-05-23 | Discharge: 2015-05-23 | Disposition: A | Discharge status: Home / Self Care | Payer: Medicare Other | Source: Ambulatory Visit | Attending: Gastroenterology | Admitting: Gastroenterology

## 2015-05-23 ENCOUNTER — Encounter (HOSPITAL_COMMUNITY): Payer: Self-pay

## 2015-05-23 ENCOUNTER — Ambulatory Visit (HOSPITAL_COMMUNITY): Payer: Medicare Other | Admitting: Anesthesiology

## 2015-05-23 DIAGNOSIS — G629 Polyneuropathy, unspecified: Secondary | ICD-10-CM | POA: Insufficient documentation

## 2015-05-23 DIAGNOSIS — Z9049 Acquired absence of other specified parts of digestive tract: Secondary | ICD-10-CM | POA: Insufficient documentation

## 2015-05-23 DIAGNOSIS — I739 Peripheral vascular disease, unspecified: Secondary | ICD-10-CM | POA: Insufficient documentation

## 2015-05-23 DIAGNOSIS — Z9989 Dependence on other enabling machines and devices: Secondary | ICD-10-CM | POA: Diagnosis not present

## 2015-05-23 DIAGNOSIS — G4730 Sleep apnea, unspecified: Secondary | ICD-10-CM | POA: Insufficient documentation

## 2015-05-23 DIAGNOSIS — E119 Type 2 diabetes mellitus without complications: Secondary | ICD-10-CM | POA: Diagnosis not present

## 2015-05-23 DIAGNOSIS — Z96652 Presence of left artificial knee joint: Secondary | ICD-10-CM | POA: Insufficient documentation

## 2015-05-23 DIAGNOSIS — Z79899 Other long term (current) drug therapy: Secondary | ICD-10-CM | POA: Diagnosis not present

## 2015-05-23 DIAGNOSIS — D124 Benign neoplasm of descending colon: Secondary | ICD-10-CM | POA: Insufficient documentation

## 2015-05-23 DIAGNOSIS — M1990 Unspecified osteoarthritis, unspecified site: Secondary | ICD-10-CM | POA: Insufficient documentation

## 2015-05-23 DIAGNOSIS — I10 Essential (primary) hypertension: Secondary | ICD-10-CM | POA: Diagnosis not present

## 2015-05-23 DIAGNOSIS — D122 Benign neoplasm of ascending colon: Secondary | ICD-10-CM | POA: Diagnosis not present

## 2015-05-23 DIAGNOSIS — Z86010 Personal history of colonic polyps: Secondary | ICD-10-CM | POA: Diagnosis not present

## 2015-05-23 DIAGNOSIS — E039 Hypothyroidism, unspecified: Secondary | ICD-10-CM | POA: Insufficient documentation

## 2015-05-23 DIAGNOSIS — K6389 Other specified diseases of intestine: Secondary | ICD-10-CM | POA: Diagnosis not present

## 2015-05-23 DIAGNOSIS — Z1211 Encounter for screening for malignant neoplasm of colon: Secondary | ICD-10-CM | POA: Diagnosis not present

## 2015-05-23 DIAGNOSIS — Z6838 Body mass index (BMI) 38.0-38.9, adult: Secondary | ICD-10-CM | POA: Diagnosis not present

## 2015-05-23 DIAGNOSIS — Z981 Arthrodesis status: Secondary | ICD-10-CM | POA: Diagnosis not present

## 2015-05-23 DIAGNOSIS — Z87891 Personal history of nicotine dependence: Secondary | ICD-10-CM | POA: Diagnosis not present

## 2015-05-23 DIAGNOSIS — K219 Gastro-esophageal reflux disease without esophagitis: Secondary | ICD-10-CM | POA: Insufficient documentation

## 2015-05-23 DIAGNOSIS — K635 Polyp of colon: Secondary | ICD-10-CM | POA: Diagnosis not present

## 2015-05-23 DIAGNOSIS — D127 Benign neoplasm of rectosigmoid junction: Secondary | ICD-10-CM | POA: Insufficient documentation

## 2015-05-23 HISTORY — PX: COLONOSCOPY WITH PROPOFOL: SHX5780

## 2015-05-23 SURGERY — COLONOSCOPY WITH PROPOFOL
Anesthesia: Monitor Anesthesia Care

## 2015-05-23 MED ORDER — PROPOFOL 10 MG/ML IV BOLUS
INTRAVENOUS | Status: AC
  Filled 2015-05-23: qty 20

## 2015-05-23 MED ORDER — MIDAZOLAM HCL 5 MG/ML IJ SOLN: 0.5000 mg | Freq: Once | INTRAMUSCULAR | Status: DC | PRN

## 2015-05-23 MED ORDER — FENTANYL CITRATE (PF) 100 MCG/2ML IJ SOLN: 25.0000 ug | INTRAMUSCULAR | Status: DC | PRN

## 2015-05-23 MED ORDER — SODIUM CHLORIDE 0.9 % IV SOLN: INTRAVENOUS | Status: DC

## 2015-05-23 MED ORDER — PROMETHAZINE HCL 25 MG/ML IJ SOLN: 6.2500 mg | INTRAMUSCULAR | Status: DC | PRN

## 2015-05-23 MED ORDER — LACTATED RINGERS IV SOLN
INTRAVENOUS | Status: DC | PRN
  Administered 2015-05-23: 12:00:00 via INTRAVENOUS

## 2015-05-23 MED ORDER — MEPERIDINE HCL 100 MG/ML IJ SOLN: 6.2500 mg | INTRAMUSCULAR | Status: DC | PRN

## 2015-05-23 MED ORDER — PROPOFOL INFUSION 10 MG/ML OPTIME
INTRAVENOUS | Status: DC | PRN
  Administered 2015-05-23: 300 ug/kg/min via INTRAVENOUS

## 2015-05-23 SURGICAL SUPPLY — 21 items

## 2015-05-23 NOTE — Transfer of Care (Signed)
Immediate Anesthesia Transfer of Care Note  Patient: Megan Horton  Procedure(s) Performed: Procedure(s): COLONOSCOPY WITH PROPOFOL (N/A)  Patient Location: PACU  Anesthesia Type:MAC  Level of Consciousness: awake, alert , oriented and patient cooperative  Airway & Oxygen Therapy: Patient Spontanous Breathing and Patient connected to face mask oxygen  Post-op Assessment: Report given to RN  Post vital signs: stable  Last Vitals:  Filed Vitals:   05/23/15 1319  BP: 122/49  Pulse: 66  Resp: 12    Complications: No apparent anesthesia complications

## 2015-05-23 NOTE — Discharge Instructions (Signed)

## 2015-05-23 NOTE — Anesthesia Postprocedure Evaluation (Signed)
  Anesthesia Post-op Note  Patient: Megan Horton  Procedure(s) Performed: Procedure(s): COLONOSCOPY WITH PROPOFOL (N/A)  Patient Location: Endoscopy Unit  Anesthesia Type:MAC  Level of Consciousness: awake, alert , oriented, patient cooperative and responds to stimulation  Airway and Oxygen Therapy: Patient Spontanous Breathing  Post-op Pain: none  Post-op Assessment: Post-op Vital signs reviewed, Patient's Cardiovascular Status Stable, Respiratory Function Stable, Patent Airway, No signs of Nausea or vomiting, NAUSEA AND VOMITING PRESENT and Pain level controlled              Post-op Vital Signs: Reviewed and stable  Last Vitals:  Filed Vitals:   05/23/15 1319  BP: 122/49  Pulse: 66  Resp: 12    Complications: No apparent anesthesia complications

## 2015-05-23 NOTE — Op Note (Signed)
[facility] [place], [address]   COLONOSCOPY PROCEDURE REPORT  PATIENT: Megan, Horton  MR#: [id number] BIRTHDATE: 06/27/1941 , 74  yrs. old GENDER: female ENDOSCOPIST: Carol Ada, MD REFERRED BY: PROCEDURE DATE:  05/25/15 PROCEDURE:   Colonoscopy with snare polypectomy ASA CLASS:   Class III INDICATIONS: Screening MEDICATIONS: Monitored anesthesia care  DESCRIPTION OF PROCEDURE:   After the risks and benefits and of the procedure were explained, informed consent was obtained.  revealed no abnormalities of the rectum.    The Pentax Colonoscope [id number] endoscope was introduced through the anus and advanced to the terminal ileum which was intubated for a short distance .  The quality of the prep was good. .  The instrument was then slowly withdrawn as the colon was fully examined. Estimated blood loss is zero unless otherwise noted in this procedure report.   FINDINGS: In the ascending colon a large 1.5 cm sessile polyp was identified.  It was removed in two passes with a hot snare in a retroflexed and forward viewing position.  A 4 mm sessile descending colon polyp was removed with a cold snare.  Four 3 mm sessile rectosigmoid colon polyps were removed with a cold snare. 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     Retroflexed views revealed no abnormalities.     The scope was then withdrawn from the patient and the procedure completed.  WITHDRAWAL TIME: 26 minutes  COMPLICATIONS: There were no immediate complications. ENDOSCOPIC IMPRESSION: 1) Polyps. 2) Mild melanosis coli RECOMMENDATIONS: 1) Await biopsy result. 2) Repeat the colonoscopy in 3 years.  REPEAT EXAM:  cc:  _______________________________ eSignedCarol Ada, MD 2015-05-25 1:18 PM   CPT CODES: ICD CODES:  The ICD and CPT codes recommended by this software are interpretations from the data that the clinical staff has captured with the software.  The verification  of the translation of this report to the ICD and CPT codes and modifiers is the sole responsibility of the health care institution and practicing physician where this report was generated.  [facility] will not be held responsible for the validity of the ICD and CPT codes included on this report.  AMA assumes no liability for data contained or not contained herein. CPT is a Designer, television/film set of the [employer].   PATIENT NAME:  Megan, Horton MR#: [id number]

## 2015-05-23 NOTE — Anesthesia Preprocedure Evaluation (Addendum)
Anesthesia Evaluation  Patient identified by MRN, date of birth, ID band Patient awake    Reviewed: Allergy & Precautions, NPO status , Patient's Chart, lab work & pertinent test results  History of Anesthesia Complications Negative for: history of anesthetic complications  Airway Mallampati: III  TM Distance: >3 FB Neck ROM: Full    Dental  (+) Chipped, Dental Advisory Given   Pulmonary sleep apnea and Continuous Positive Airway Pressure Ventilation , former smoker (quit 1995),  breath sounds clear to auscultation        Cardiovascular hypertension, Pt. on medications Rhythm:Regular Rate:Normal     Neuro/Psych negative neurological ROS     GI/Hepatic Neg liver ROS, GERD-  Medicated and Controlled,  Endo/Other  diabetes, Oral Hypoglycemic AgentsHypothyroidism Morbid obesity  Renal/GU negative Renal ROS     Musculoskeletal  (+) Arthritis -, Osteoarthritis,    Abdominal (+) + obese,   Peds  Hematology   Anesthesia Other Findings   Reproductive/Obstetrics                            Anesthesia Physical Anesthesia Plan  ASA: III  Anesthesia Plan: MAC   Post-op Pain Management:    Induction: Intravenous  Airway Management Planned: Natural Airway and Simple Face Mask  Additional Equipment:   Intra-op Plan:   Post-operative Plan:   Informed Consent: I have reviewed the patients History and Physical, chart, labs and discussed the procedure including the risks, benefits and alternatives for the proposed anesthesia with the patient or authorized representative who has indicated his/her understanding and acceptance.   Dental advisory given  Plan Discussed with: CRNA and Surgeon  Anesthesia Plan Comments: (Plan routine monitors, MAC)        Anesthesia Quick Evaluation

## 2015-05-26 ENCOUNTER — Encounter (HOSPITAL_COMMUNITY): Payer: Self-pay | Admitting: Gastroenterology

## 2015-05-26 LAB — GLUCOSE, CAPILLARY: GLUCOSE-CAPILLARY: 84 mg/dL (ref 65–99)

## 2015-06-03 ENCOUNTER — Ambulatory Visit (INDEPENDENT_AMBULATORY_CARE_PROVIDER_SITE_OTHER): Payer: Medicare Other | Admitting: Pulmonary Disease

## 2015-06-03 ENCOUNTER — Encounter: Payer: Self-pay | Admitting: Pulmonary Disease

## 2015-06-03 VITALS — BP 138/72 | HR 70 | Ht 64.75 in | Wt 227.4 lb

## 2015-06-03 DIAGNOSIS — G4730 Sleep apnea, unspecified: Secondary | ICD-10-CM | POA: Diagnosis not present

## 2015-06-03 NOTE — Patient Instructions (Signed)
CPAP machine is working [employer] Try to get exposure to sunlight early am Call for issues

## 2015-06-03 NOTE — Progress Notes (Signed)
   Subjective:    Patient ID: Megan Horton, female    DOB: 1941-06-08, 74 y.o.   MRN: [id number]  HPI  74 year old never smoker for FU of OSA & excessive daytime somnolence & frequent falls. Epworth sleepiness score is 9. She report sleepiness and radius social situations. She gets up at 11 AM, is unable to wake up with the alarm clock- finishes her house chores and goes back to sleep. After dinner she gets into her chair and dozes off. Bedtime is about 10 PM, sleep latency varies from 10 minutes to an hour, reports one nocturnal awakening, without post void latency, and is out of bed by 11 AM. She reports falls 3 over the past 2 years, she has seen a neurologist and neurosurgeon.  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 She underwent evaluation for headache by Dr. Catalina Gravel had a headache clinic. PSG in 2009 in [facility] showed moderate OSA, and she is maintained on C Pap 15 cm with C-Flex 2 cm-DME is [employer] out of [employer]. She drinks 3-4 cups of coffee daily Increased dose of Neurontin made her sleepy-but now she has cut down to 300 mg at bedtime  06/03/2015  Chief Complaint  Patient presents with  . Sleep Apnea    wearing CPAP every night; no problems with CPAP machine.  patient fell the 1st of June, hit door frame, has concussion and when she lays on her right side, she gets a sharp pain in her head.  Discuss CPAP options (pt brought brochure with her to review with RA)   Had another fall Sleeps until 11 am No issues with mask or pressure,w ell rested when she wakes up, no naps Wonders if she should get new machine Download 08/2014 -on auto,, avg 15 cm, AHI 3/h, good usage 59m 04/2015 >> good usage, flex +2   Significant tests/ events  Head CT 10/2013 was normal CT neck 01/2014 showed an enlarged left thyroid and mildly enlarged mediastinal lymph nodes. TSH was 4.4 in 08/2014 and 3.9 and 11/2014   07/2009 PSG AHI 6/h, lowest desatn 81% Corrected by CPAP 14 cm Review of  Systems neg for any significant sore throat, dysphagia, itching, sneezing, nasal congestion or excess/ purulent secretions, fever, chills, sweats, unintended wt loss, pleuritic or exertional cp, hempoptysis, orthopnea pnd or change in chronic leg swelling. Also denies presyncope, palpitations, heartburn, abdominal pain, nausea, vomiting, diarrhea or change in bowel or urinary habits, dysuria,hematuria, rash, arthralgias, visual complaints, headache, numbness weakness or ataxia.      Objective:   Physical Exam  Gen. Pleasant, obese, in no distress ENT - no lesions, no post nasal drip Neck: No JVD, no thyromegaly, no carotid bruits Lungs: no use of accessory muscles, no dullness to percussion, decreased without rales or rhonchi  Cardiovascular: Rhythm regular, heart sounds  normal, no murmurs or gallops, no peripheral edema Musculoskeletal: No deformities, no cyanosis or clubbing , no tremors       Assessment & Plan:

## 2015-06-03 NOTE — Assessment & Plan Note (Addendum)
CPAP machine is working OK - she will qualify for new CPAP if needed Try to get exposure to sunlight early am Call for issues, doubt MSLT needed here to investigate EDS.  Weight loss encouraged, compliance with goal of at least 4-6 hrs every night is the expectation. Advised against medications with sedative side effects Cautioned against driving when sleepy - understanding that sleepiness will vary on a day to day basis

## 2015-08-12 DIAGNOSIS — E118 Type 2 diabetes mellitus with unspecified complications: Secondary | ICD-10-CM | POA: Diagnosis not present

## 2015-08-12 DIAGNOSIS — E039 Hypothyroidism, unspecified: Secondary | ICD-10-CM | POA: Diagnosis not present

## 2015-08-12 DIAGNOSIS — I10 Essential (primary) hypertension: Secondary | ICD-10-CM | POA: Diagnosis not present

## 2015-08-12 DIAGNOSIS — L309 Dermatitis, unspecified: Secondary | ICD-10-CM | POA: Diagnosis not present

## 2015-09-09 DIAGNOSIS — I10 Essential (primary) hypertension: Secondary | ICD-10-CM | POA: Diagnosis not present

## 2015-09-09 DIAGNOSIS — E1122 Type 2 diabetes mellitus with diabetic chronic kidney disease: Secondary | ICD-10-CM | POA: Diagnosis not present

## 2015-09-09 DIAGNOSIS — E559 Vitamin D deficiency, unspecified: Secondary | ICD-10-CM | POA: Diagnosis not present

## 2015-09-09 DIAGNOSIS — E1140 Type 2 diabetes mellitus with diabetic neuropathy, unspecified: Secondary | ICD-10-CM | POA: Diagnosis not present

## 2015-09-09 DIAGNOSIS — M8580 Other specified disorders of bone density and structure, unspecified site: Secondary | ICD-10-CM | POA: Diagnosis not present

## 2015-09-16 DIAGNOSIS — E1122 Type 2 diabetes mellitus with diabetic chronic kidney disease: Secondary | ICD-10-CM | POA: Diagnosis not present

## 2015-09-16 DIAGNOSIS — N182 Chronic kidney disease, stage 2 (mild): Secondary | ICD-10-CM | POA: Diagnosis not present

## 2015-09-16 DIAGNOSIS — Z23 Encounter for immunization: Secondary | ICD-10-CM | POA: Diagnosis not present

## 2015-09-16 DIAGNOSIS — I129 Hypertensive chronic kidney disease with stage 1 through stage 4 chronic kidney disease, or unspecified chronic kidney disease: Secondary | ICD-10-CM | POA: Diagnosis not present

## 2015-09-16 DIAGNOSIS — I5032 Chronic diastolic (congestive) heart failure: Secondary | ICD-10-CM | POA: Diagnosis not present

## 2015-10-05 DIAGNOSIS — I10 Essential (primary) hypertension: Secondary | ICD-10-CM | POA: Diagnosis not present

## 2015-10-05 DIAGNOSIS — R1013 Epigastric pain: Secondary | ICD-10-CM | POA: Diagnosis not present

## 2015-10-05 DIAGNOSIS — Z886 Allergy status to analgesic agent status: Secondary | ICD-10-CM | POA: Diagnosis not present

## 2015-10-05 DIAGNOSIS — Z87891 Personal history of nicotine dependence: Secondary | ICD-10-CM | POA: Diagnosis not present

## 2015-10-05 DIAGNOSIS — J449 Chronic obstructive pulmonary disease, unspecified: Secondary | ICD-10-CM | POA: Diagnosis not present

## 2015-10-05 DIAGNOSIS — Z7984 Long term (current) use of oral hypoglycemic drugs: Secondary | ICD-10-CM | POA: Diagnosis not present

## 2015-10-05 DIAGNOSIS — R110 Nausea: Secondary | ICD-10-CM | POA: Diagnosis not present

## 2015-10-05 DIAGNOSIS — E785 Hyperlipidemia, unspecified: Secondary | ICD-10-CM | POA: Diagnosis not present

## 2015-10-05 DIAGNOSIS — E119 Type 2 diabetes mellitus without complications: Secondary | ICD-10-CM | POA: Diagnosis not present

## 2015-10-05 DIAGNOSIS — Z79899 Other long term (current) drug therapy: Secondary | ICD-10-CM | POA: Diagnosis not present

## 2015-10-05 DIAGNOSIS — R109 Unspecified abdominal pain: Secondary | ICD-10-CM | POA: Diagnosis not present

## 2015-10-05 DIAGNOSIS — R1011 Right upper quadrant pain: Secondary | ICD-10-CM | POA: Diagnosis not present

## 2015-10-05 DIAGNOSIS — Z91048 Other nonmedicinal substance allergy status: Secondary | ICD-10-CM | POA: Diagnosis not present

## 2015-10-05 DIAGNOSIS — E079 Disorder of thyroid, unspecified: Secondary | ICD-10-CM | POA: Diagnosis not present

## 2015-10-05 DIAGNOSIS — R160 Hepatomegaly, not elsewhere classified: Secondary | ICD-10-CM | POA: Diagnosis not present

## 2015-10-05 DIAGNOSIS — R162 Hepatomegaly with splenomegaly, not elsewhere classified: Secondary | ICD-10-CM | POA: Diagnosis not present

## 2015-10-06 DIAGNOSIS — R1013 Epigastric pain: Secondary | ICD-10-CM | POA: Diagnosis not present

## 2015-10-06 DIAGNOSIS — R162 Hepatomegaly with splenomegaly, not elsewhere classified: Secondary | ICD-10-CM | POA: Diagnosis not present

## 2015-10-07 ENCOUNTER — Emergency Department (HOSPITAL_COMMUNITY): Payer: Medicare Other

## 2015-10-07 ENCOUNTER — Emergency Department (HOSPITAL_COMMUNITY)
Admission: EM | Admit: 2015-10-07 | Discharge: 2015-10-07 | Disposition: A | Discharge status: Home / Self Care | Payer: Medicare Other | Attending: Physician Assistant | Admitting: Physician Assistant

## 2015-10-07 ENCOUNTER — Encounter (HOSPITAL_COMMUNITY): Payer: Self-pay | Admitting: Emergency Medicine

## 2015-10-07 DIAGNOSIS — Z794 Long term (current) use of insulin: Secondary | ICD-10-CM | POA: Insufficient documentation

## 2015-10-07 DIAGNOSIS — M1990 Unspecified osteoarthritis, unspecified site: Secondary | ICD-10-CM | POA: Diagnosis not present

## 2015-10-07 DIAGNOSIS — R079 Chest pain, unspecified: Secondary | ICD-10-CM | POA: Diagnosis not present

## 2015-10-07 DIAGNOSIS — E119 Type 2 diabetes mellitus without complications: Secondary | ICD-10-CM | POA: Insufficient documentation

## 2015-10-07 DIAGNOSIS — Z87891 Personal history of nicotine dependence: Secondary | ICD-10-CM | POA: Insufficient documentation

## 2015-10-07 DIAGNOSIS — R112 Nausea with vomiting, unspecified: Secondary | ICD-10-CM | POA: Diagnosis not present

## 2015-10-07 DIAGNOSIS — Z7951 Long term (current) use of inhaled steroids: Secondary | ICD-10-CM | POA: Diagnosis not present

## 2015-10-07 DIAGNOSIS — E785 Hyperlipidemia, unspecified: Secondary | ICD-10-CM | POA: Diagnosis not present

## 2015-10-07 DIAGNOSIS — R1084 Generalized abdominal pain: Secondary | ICD-10-CM | POA: Diagnosis not present

## 2015-10-07 DIAGNOSIS — R42 Dizziness and giddiness: Secondary | ICD-10-CM | POA: Diagnosis not present

## 2015-10-07 DIAGNOSIS — K210 Gastro-esophageal reflux disease with esophagitis: Secondary | ICD-10-CM | POA: Diagnosis not present

## 2015-10-07 DIAGNOSIS — I498 Other specified cardiac arrhythmias: Secondary | ICD-10-CM | POA: Diagnosis not present

## 2015-10-07 DIAGNOSIS — K219 Gastro-esophageal reflux disease without esophagitis: Secondary | ICD-10-CM | POA: Insufficient documentation

## 2015-10-07 DIAGNOSIS — G4730 Sleep apnea, unspecified: Secondary | ICD-10-CM | POA: Diagnosis not present

## 2015-10-07 DIAGNOSIS — R0789 Other chest pain: Secondary | ICD-10-CM | POA: Diagnosis not present

## 2015-10-07 DIAGNOSIS — Z90710 Acquired absence of both cervix and uterus: Secondary | ICD-10-CM | POA: Insufficient documentation

## 2015-10-07 DIAGNOSIS — Z79899 Other long term (current) drug therapy: Secondary | ICD-10-CM | POA: Diagnosis not present

## 2015-10-07 DIAGNOSIS — I10 Essential (primary) hypertension: Secondary | ICD-10-CM | POA: Diagnosis not present

## 2015-10-07 DIAGNOSIS — G629 Polyneuropathy, unspecified: Secondary | ICD-10-CM | POA: Diagnosis not present

## 2015-10-07 DIAGNOSIS — Z9049 Acquired absence of other specified parts of digestive tract: Secondary | ICD-10-CM | POA: Insufficient documentation

## 2015-10-07 DIAGNOSIS — E039 Hypothyroidism, unspecified: Secondary | ICD-10-CM | POA: Diagnosis not present

## 2015-10-07 DIAGNOSIS — R100 Acute abdomen: Secondary | ICD-10-CM | POA: Diagnosis not present

## 2015-10-07 DIAGNOSIS — R1013 Epigastric pain: Secondary | ICD-10-CM | POA: Diagnosis present

## 2015-10-07 LAB — CBC WITH DIFFERENTIAL/PLATELET
BASOS ABS: 0 10*3/uL (ref 0.0–0.1)
Basophils Relative: 1 %
EOS PCT: 6 %
Eosinophils Absolute: 0.3 10*3/uL (ref 0.0–0.7)
HCT: 41.2 % (ref 36.0–46.0)
Hemoglobin: 13.2 g/dL (ref 12.0–15.0)
LYMPHS PCT: 29 %
Lymphs Abs: 1.4 10*3/uL (ref 0.7–4.0)
MCH: 29.7 pg (ref 26.0–34.0)
MCHC: 32 g/dL (ref 30.0–36.0)
MCV: 92.8 fL (ref 78.0–100.0)
MONO ABS: 0.4 10*3/uL (ref 0.1–1.0)
MONOS PCT: 8 %
NEUTROS ABS: 2.8 10*3/uL (ref 1.7–7.7)
Neutrophils Relative %: 56 %
Platelets: 129 10*3/uL — ABNORMAL LOW (ref 150–400)
RBC: 4.44 MIL/uL (ref 3.87–5.11)
RDW: 14.8 % (ref 11.5–15.5)
WBC: 5 10*3/uL (ref 4.0–10.5)

## 2015-10-07 LAB — COMPREHENSIVE METABOLIC PANEL
ALBUMIN: 3.8 g/dL (ref 3.5–5.0)
ALT: 26 U/L (ref 14–54)
ANION GAP: 6 (ref 5–15)
AST: 26 U/L (ref 15–41)
Alkaline Phosphatase: 105 U/L (ref 38–126)
BILIRUBIN TOTAL: 0.7 mg/dL (ref 0.3–1.2)
BUN: 9 mg/dL (ref 6–20)
CO2: 30 mmol/L (ref 22–32)
Calcium: 9.2 mg/dL (ref 8.9–10.3)
Chloride: 107 mmol/L (ref 101–111)
Creatinine, Ser: 0.64 mg/dL (ref 0.44–1.00)
GFR calc Af Amer: >60 mL/min (ref >60)
GFR calc non Af Amer: >60 mL/min (ref >60)
GLUCOSE: 109 mg/dL — AB (ref 65–99)
Potassium: 3.6 mmol/L (ref 3.5–5.1)
SODIUM: 143 mmol/L (ref 135–145)
TOTAL PROTEIN: 6.5 g/dL (ref 6.5–8.1)

## 2015-10-07 LAB — URINALYSIS, ROUTINE W REFLEX MICROSCOPIC
Bilirubin Urine: NEGATIVE
GLUCOSE, UA: NEGATIVE mg/dL
HGB URINE DIPSTICK: NEGATIVE
KETONES UR: NEGATIVE mg/dL
LEUKOCYTES UA: NEGATIVE
Nitrite: NEGATIVE
Protein, ur: NEGATIVE mg/dL
Specific Gravity, Urine: 1.01 (ref 1.005–1.030)
pH: 7.5 (ref 5.0–8.0)

## 2015-10-07 LAB — LIPASE, BLOOD: LIPASE: 25 U/L (ref 11–51)

## 2015-10-07 LAB — TROPONIN I: Troponin I: <0.03 ng/mL (ref <0.031)

## 2015-10-07 MED ORDER — GI COCKTAIL ~~LOC~~
30.0000 mL | Freq: Once | ORAL | Status: AC
  Administered 2015-10-07: 30 mL via ORAL
  Filled 2015-10-07: qty 30

## 2015-10-07 NOTE — ED Notes (Signed)
From PCP via [facility] for abd pain, c/o pain and vomiting X1 month with hot flashes, no fever or diarrhea, VSS,

## 2015-10-07 NOTE — Discharge Instructions (Signed)
Please continue to take your acid reflux medication and follow up with Dr. Benson Norway, who is your GI doctor- You may need to do an endoscopy done- which is a camera going down your food pipe to look for any kind of ulcers, which may be possible given your belly pain.  Please follow up with your PCP

## 2015-10-07 NOTE — ED Provider Notes (Signed)
CSN: [id number]     Arrival date & time 10/07/15  1301 History   First MD Initiated Contact with Patient 10/07/15 1307     Chief Complaint  Patient presents with  . Abdominal Pain     (Consider location/radiation/quality/duration/timing/severity/associated sxs/prior Treatment) HPI  Pt is a 74 yo woman who presents here with abdominal pain. She says that Sunday night she had high blood pressure of 210/100 then had an episode of non-bloody emesis and epigastric pain and she called her PCP who asked her to go to ER so she went to [facility] yesterday where she had workup done and was given clonidine and CT abdomen was done. .   Her main complaint is epigastric pain and right sided chest wall pain both of which are exacerbated by palpation and movement. Since Sunday night, she has not had any episodes of vomiting. She has a history of GERD for which she follows the GI doctor and is on dexilant. She does not think her GERD symptoms have worsened recently. She is able to keep in diet  Pt denies fevers, diarrhea, hematochezia, melena, weight loss or weight gain, no travel history.  Patient is a former smoker with 30 pac year history and quit date in 1984. Pt also consumes alcohol occasionally and there has been no changes recently.  She had a colonoscopy recently in July which showed mild melanosis coli and few polyps and repeat one is due in 3 years.   In the ER yesterday, she had a CT abdomen with contrast done and was found to have  a 4.5 cm hypodense mass in the interlobar region of the liver with lobular margins and heterogeneous enhancement. Unfortunately, the mass was not imaged on the delayed phase images and is therefore nonspecific in etiology, especially since no prior imaging is available for comparison. Main differential considerations include hemangioma, abscess, neoplasm (primary versus metastasis). Recommend comparison with any prior outside studies if available. Otherwise,  recommend followup with MRI to exclude the possibility of neoplasm. She is also s/p Cholecystectomy. Mild splenomegaly of 14.5 cm with distended portal vein, consistent with portal hypertension is also noted.  Past Medical History  Diagnosis Date  . Hypertension   . Peripheral vascular disease ([place])   . Peripheral neuropathy ([place])   . Seasonal allergies   . Diabetes mellitus ([place])   . Hypothyroidism   . GERD (gastroesophageal reflux disease)   . Arthritis   . Sleep apnea     CPAP, sleep study 3-4 years ago in [place], [place] Dr. Elbert Ewings   . Wears glasses   . Adhesive capsulitis of right shoulder   . Partial tear of rotator cuff(726.13)   . Complication of anesthesia 5/13    did have some disorientation post cerv fusion due to low sats  . Left knee DJD   . Seasonal allergies   . Urgency of urination   . Hyperlipemia    Past Surgical History  Procedure Laterality Date  . Abdominal hysterectomy    . Cholecystectomy  2009  . Hemorrhoid surgery  2009  . Knee arthroscopy      Left x2  . Carpal tunnel release      bilat  . Cervical fusion    . Tonsillectomy    . Varicose vein surgery      Right  . Heel spur excision      bilat  . Nose surgery    . Anterior cervical decomp/discectomy fusion  03/10/2012    Procedure: ANTERIOR  CERVICAL DECOMPRESSION/DISCECTOMY FUSION 1 LEVEL;  Surgeon: Eustace Moore, MD;  Location: [facility];  Service: Neurosurgery;  Laterality: N/A;  Cervical five-six anterior cervical decompression fusion with plating  . Eye surgery  2012    bilat cataract  . Shoulder arthroscopy  06/27/2012    Procedure: ARTHROSCOPY SHOULDER;  Surgeon: Lorn Junes, MD;  Location: [facility];  Service: Orthopedics;  Laterality: Right;  right shoulder arthroscopy debridement extensive, distal clavulectomy, with resect adhesions with maniplulation  . Colonoscopy w/ polypectomy    . Total knee arthroplasty Left 07/02/2013    Procedure: TOTAL KNEE  ARTHROPLASTY- left;  Surgeon: Lorn Junes, MD;  Location: [facility];  Service: Orthopedics;  Laterality: Left;  . Colonoscopy with propofol N/A 05/23/2015    Procedure: COLONOSCOPY WITH PROPOFOL;  Surgeon: Carol Ada, MD;  Location: [facility];  Service: Endoscopy;  Laterality: N/A;   Family History  Problem Relation Age of Onset  . Heart disease Mother   . Heart disease Father   . Heart disease Brother   . Heart attack Father   . Heart attack Brother   . Heart attack Sister   . Hypertension Mother   . Hypertension Father   . Hypertension Sister   . Stroke Mother   . Stroke Brother    Social History  Substance Use Topics  . Smoking status: Former Smoker -- 0.25 packs/day for 16 years    Types: Cigarettes    Quit date: 12/26/1993  . Smokeless tobacco: Never Used  . Alcohol Use: Yes     Comment: very rarely wine   OB History    No data available     Review of Systems  Constitutional: Negative for fever, chills, activity change, appetite change, fatigue and unexpected weight change.  Respiratory: Negative for cough, chest tightness, shortness of breath, wheezing and stridor.   Cardiovascular: Positive for chest pain. Negative for palpitations.  Gastrointestinal: Positive for vomiting and abdominal pain. Negative for nausea, diarrhea, constipation and blood in stool.  Genitourinary: Negative for dysuria, flank pain and difficulty urinating.  Neurological: Negative for dizziness, syncope and light-headedness.      Allergies  Asa; Adhesive; and Tylenol  Home Medications   Prior to Admission medications   Medication Sig Start Date End Date Taking? Authorizing Provider  amLODipine (NORVASC) 5 MG tablet Take 5 mg by mouth every morning.     Historical Provider, MD  cycloSPORINE (RESTASIS) 0.05 % ophthalmic emulsion Place 1 drop into both eyes 2 (two) times daily.    Historical Provider, MD  dexlansoprazole (DEXILANT) 60 MG capsule Take 60 mg by mouth every morning.      Historical Provider, MD  DULoxetine (CYMBALTA) 60 MG capsule Take 60 mg by mouth 2 (two) times daily.    Historical Provider, MD  Exenatide ER (BYDUREON) 2 MG PEN Inject 1 each into the skin every 7 (seven) days. Sunday    Historical Provider, MD  fluticasone (FLONASE) 50 MCG/ACT nasal spray Place 1-2 sprays into both nostrils daily as needed for allergies or rhinitis.    Historical Provider, MD  gabapentin (NEURONTIN) 300 MG capsule Take 300 mg by mouth at bedtime.     Historical Provider, MD  hydrochlorothiazide (MICROZIDE) 12.5 MG capsule Take 12.5 mg by mouth every morning.     Historical Provider, MD  ibuprofen (ADVIL,MOTRIN) 200 MG tablet Take 200 mg by mouth daily.    Historical Provider, MD  Lactobacillus-Inulin (CULTURELLE) CAPS Take 1 capsule by mouth daily.  Historical Provider, MD  levothyroxine (SYNTHROID, LEVOTHROID) 100 MCG tablet Take 100 mcg by mouth at bedtime.     Historical Provider, MD  lovastatin (MEVACOR) 40 MG tablet Take 40 mg by mouth at bedtime.    Historical Provider, MD  metFORMIN (GLUCOPHAGE) 500 MG tablet Take 500 mg by mouth daily with breakfast.    Historical Provider, MD  Multiple Vitamins-Minerals (CENTRUM SILVER PO) Take 1 tablet by mouth every morning.     Historical Provider, MD  Olopatadine HCl (PATANASE) 0.6 % SOLN Place 1 each into the nose daily as needed (for allergies).    Historical Provider, MD  ranitidine (ZANTAC) 150 MG tablet Take 150 mg by mouth at bedtime.    Historical Provider, MD  telmisartan (MICARDIS) 80 MG tablet Take 80 mg by mouth every morning.     Historical Provider, MD  tetrahydrozoline (VISINE) 0.05 % ophthalmic solution Place 1 drop into both eyes 2 (two) times daily as needed (for allergies).     Historical Provider, MD   BP 158/66 mmHg  Pulse 62  Temp(Src) 98.1 F (36.7 C) (Oral)  Resp 20  SpO2 98% Physical Exam  Constitutional: She is oriented to person, place, and time. She appears well-developed and well-nourished.   HENT:  Head: Normocephalic and atraumatic.  Cardiovascular: Normal rate, regular rhythm and normal heart sounds.   Right chest wall tender to palpation   Pulmonary/Chest: Effort normal and breath sounds normal. No respiratory distress. She has no wheezes. She exhibits tenderness.  Abdominal: Soft. She exhibits no distension and no mass. There is tenderness. There is no rebound and no guarding.  ttp in epigastric region   Musculoskeletal: Normal range of motion.  Neurological: She is alert and oriented to person, place, and time.  Skin: No rash noted.    ED Course  Procedures (including critical care time) Labs Review Labs Reviewed - No data to display  Imaging Review No results found. I have personally reviewed and evaluated these images and lab results as part of my medical decision-making.   EKG Interpretation None      MDM   Final diagnoses:  None    For pt's abdominal pain, I suspect that she may be developing an ulcer- duodenal vs gastric as her pain is so localized on the epigastric region. I do not think there is any perforation recently.  We have asked the patient to follow up with her existing GI physician Dr Benson Norway, as she needs to have an elective EGD done. Given GI cocktail and PPI which she already takes.   We also ruled out ACS since she was having right sided chest wall pain. Also, pt has not received zostavax but I do not think pt is developing any shingles as I do not see any vesicles or rash on exam and pain is not in any specific dermatomal region.   For her 4.5 cm liver mass, she needs to follow up with her PCP who will make the referral to oncology and order further imaging as at this point the diagnosis is not clear, and she probably needs an MRI to further evaluate the type of lesion.           Burgess Estelle, MD 10/07/15 [place], MD 10/09/15 [id number]

## 2015-10-08 ENCOUNTER — Other Ambulatory Visit: Payer: Self-pay | Admitting: Gastroenterology

## 2015-10-08 DIAGNOSIS — R935 Abnormal findings on diagnostic imaging of other abdominal regions, including retroperitoneum: Secondary | ICD-10-CM

## 2015-10-08 DIAGNOSIS — R634 Abnormal weight loss: Secondary | ICD-10-CM | POA: Diagnosis not present

## 2015-10-08 DIAGNOSIS — R1110 Vomiting, unspecified: Secondary | ICD-10-CM | POA: Diagnosis not present

## 2015-10-08 DIAGNOSIS — R933 Abnormal findings on diagnostic imaging of other parts of digestive tract: Secondary | ICD-10-CM | POA: Diagnosis not present

## 2015-10-08 LAB — URINE CULTURE: Culture: 8000

## 2015-10-09 ENCOUNTER — Encounter (HOSPITAL_COMMUNITY): Payer: Self-pay | Admitting: *Deleted

## 2015-10-09 NOTE — H&P (Signed)
Megan Horton HPI: The patient presented to the ER on Sunday at [facility] for complaints of nausea and epigastric pain. A CT scan was performed and she was noted to have 4.5 cm intralobar hypodense lesion of unknown etiology and splenomegaly at 14.5 cm. There was also noted dilation of her portal vein consistent with portal HTN. Earlier in the year she had her colonoscopy and afterwards she started to have weight loss. Over the interval time period she started to develop nausea. This past month she reports 10 episodes of vomiting. This past Sunday her vomiting was rather severe and she was instructed to go to the ER. Follow up with her PCP did not reveal any significant improvement and then she was sent to the [facility] ER. She is s/p cholecystectomy and she had a 13 year history of smoking from the ages of [age]. The blood work in the ER was normal.  Past Medical History  Diagnosis Date  . Hypertension   . Peripheral vascular disease ([place])   . Seasonal allergies   . Diabetes mellitus ([place])   . Hypothyroidism   . GERD (gastroesophageal reflux disease)   . Arthritis   . Sleep apnea     CPAP, sleep study 3-4 years ago in [place], [place] Dr. Elbert Ewings   . Wears glasses   . Adhesive capsulitis of right shoulder   . Partial tear of rotator cuff(726.13) left  . Complication of anesthesia 5/13    did have some disorientation post cerv fusion due to low sats  . Left knee DJD   . Seasonal allergies   . Urgency of urination   . Hyperlipemia   . Peripheral neuropathy ([facility])     fingers     Past Surgical History  Procedure Laterality Date  . Cholecystectomy  2009  . Hemorrhoid surgery  2009  . Knee arthroscopy      Left x2  . Carpal tunnel release      bilat  . Cervical fusion    . Tonsillectomy    . Varicose vein surgery      Right  . Heel spur excision      bilat  . Nose surgery    . Anterior cervical decomp/discectomy fusion  03/10/2012    Procedure: ANTERIOR CERVICAL  DECOMPRESSION/DISCECTOMY FUSION 1 LEVEL;  Surgeon: Eustace Moore, MD;  Location: [facility];  Service: Neurosurgery;  Laterality: N/A;  Cervical five-six anterior cervical decompression fusion with plating  . Eye surgery  2012    bilat cataract  . Shoulder arthroscopy  06/27/2012    Procedure: ARTHROSCOPY SHOULDER;  Surgeon: Lorn Junes, MD;  Location: [facility];  Service: Orthopedics;  Laterality: Right;  right shoulder arthroscopy debridement extensive, distal clavulectomy, with resect adhesions with maniplulation  . Colonoscopy w/ polypectomy    . Total knee arthroplasty Left 07/02/2013    Procedure: TOTAL KNEE ARTHROPLASTY- left;  Surgeon: Lorn Junes, MD;  Location: [place] [facility];  Service: Orthopedics;  Laterality: Left;  . Colonoscopy with propofol N/A 05/23/2015    Procedure: COLONOSCOPY WITH PROPOFOL;  Surgeon: Carol Ada, MD;  [facility];  Service: Endoscopy;  Laterality: N/A;  . Abdominal hysterectomy      complete    Family History  Problem Relation Age of Onset  . Heart disease Mother   . Heart disease Father   . Heart disease Brother   . Heart attack Father   . Heart attack Brother   . Heart attack Sister   . Hypertension  Mother   . Hypertension Father   . Hypertension Sister   . Stroke Mother   . Stroke Brother     Social History:  reports that she quit smoking about 21 years ago. Her smoking use included Cigarettes. She has a 4 pack-year smoking history. She has never used smokeless tobacco. She reports that she drinks alcohol. She reports that she does not use illicit drugs.  Allergies:  Allergies  Allergen Reactions  . Asa [Aspirin]     GERD  . Adhesive [Tape] Other (See Comments)    blister  . Tylenol [Acetaminophen] Nausea And Vomiting    Aggravates acid reflux    Medications: Scheduled: Continuous:  No results found for this or any previous visit (from the past 24 hour(s)).   No results found.  ROS:  As stated above in  the HPI otherwise negative.  There were no vitals taken for this visit.    PE: Gen: NAD, Alert and Oriented HEENT:  [name], EOMI Neck: Supple, no LAD Lungs: CTA Bilaterally CV: RRR without M/G/R ABM: Soft, NTND, +BS Ext: No C/C/E  Assessment/Plan: 1) Abnormal CT scan. 2) Vomiting.  Plan: 1) EGD.  [name] D 10/09/2015, 3:50 PM

## 2015-10-10 ENCOUNTER — Encounter (HOSPITAL_COMMUNITY): Payer: Self-pay | Admitting: *Deleted

## 2015-10-10 ENCOUNTER — Ambulatory Visit (HOSPITAL_COMMUNITY)
Admission: RE | Admit: 2015-10-10 | Discharge: 2015-10-10 | Disposition: A | Discharge status: Home / Self Care | Payer: Medicare Other | Source: Ambulatory Visit | Attending: Gastroenterology | Admitting: Gastroenterology

## 2015-10-10 ENCOUNTER — Ambulatory Visit (HOSPITAL_COMMUNITY): Payer: Medicare Other | Admitting: Anesthesiology

## 2015-10-10 ENCOUNTER — Encounter (HOSPITAL_COMMUNITY)
Admission: RE | Disposition: A | Discharge status: Home / Self Care | Payer: Self-pay | Source: Ambulatory Visit | Attending: Gastroenterology

## 2015-10-10 DIAGNOSIS — Z87891 Personal history of nicotine dependence: Secondary | ICD-10-CM | POA: Insufficient documentation

## 2015-10-10 DIAGNOSIS — E039 Hypothyroidism, unspecified: Secondary | ICD-10-CM | POA: Insufficient documentation

## 2015-10-10 DIAGNOSIS — Z9842 Cataract extraction status, left eye: Secondary | ICD-10-CM | POA: Diagnosis not present

## 2015-10-10 DIAGNOSIS — E785 Hyperlipidemia, unspecified: Secondary | ICD-10-CM | POA: Insufficient documentation

## 2015-10-10 DIAGNOSIS — R1110 Vomiting, unspecified: Secondary | ICD-10-CM | POA: Diagnosis not present

## 2015-10-10 DIAGNOSIS — I739 Peripheral vascular disease, unspecified: Secondary | ICD-10-CM | POA: Diagnosis not present

## 2015-10-10 DIAGNOSIS — E119 Type 2 diabetes mellitus without complications: Secondary | ICD-10-CM | POA: Insufficient documentation

## 2015-10-10 DIAGNOSIS — Z7984 Long term (current) use of oral hypoglycemic drugs: Secondary | ICD-10-CM | POA: Insufficient documentation

## 2015-10-10 DIAGNOSIS — M179 Osteoarthritis of knee, unspecified: Secondary | ICD-10-CM | POA: Insufficient documentation

## 2015-10-10 DIAGNOSIS — I10 Essential (primary) hypertension: Secondary | ICD-10-CM | POA: Insufficient documentation

## 2015-10-10 DIAGNOSIS — R634 Abnormal weight loss: Secondary | ICD-10-CM | POA: Diagnosis not present

## 2015-10-10 DIAGNOSIS — K219 Gastro-esophageal reflux disease without esophagitis: Secondary | ICD-10-CM | POA: Insufficient documentation

## 2015-10-10 DIAGNOSIS — G4730 Sleep apnea, unspecified: Secondary | ICD-10-CM | POA: Insufficient documentation

## 2015-10-10 DIAGNOSIS — G629 Polyneuropathy, unspecified: Secondary | ICD-10-CM | POA: Insufficient documentation

## 2015-10-10 DIAGNOSIS — R1013 Epigastric pain: Secondary | ICD-10-CM | POA: Diagnosis not present

## 2015-10-10 DIAGNOSIS — M1990 Unspecified osteoarthritis, unspecified site: Secondary | ICD-10-CM | POA: Diagnosis not present

## 2015-10-10 DIAGNOSIS — Z9841 Cataract extraction status, right eye: Secondary | ICD-10-CM | POA: Insufficient documentation

## 2015-10-10 DIAGNOSIS — Z6838 Body mass index (BMI) 38.0-38.9, adult: Secondary | ICD-10-CM | POA: Insufficient documentation

## 2015-10-10 DIAGNOSIS — Z96652 Presence of left artificial knee joint: Secondary | ICD-10-CM | POA: Diagnosis not present

## 2015-10-10 DIAGNOSIS — Z79899 Other long term (current) drug therapy: Secondary | ICD-10-CM | POA: Diagnosis not present

## 2015-10-10 HISTORY — PX: ESOPHAGOGASTRODUODENOSCOPY (EGD) WITH PROPOFOL: SHX5813

## 2015-10-10 LAB — GLUCOSE, CAPILLARY: GLUCOSE-CAPILLARY: 99 mg/dL (ref 65–99)

## 2015-10-10 SURGERY — ESOPHAGOGASTRODUODENOSCOPY (EGD) WITH PROPOFOL
Anesthesia: Monitor Anesthesia Care

## 2015-10-10 MED ORDER — PROPOFOL 10 MG/ML IV BOLUS
INTRAVENOUS | Status: DC | PRN
  Administered 2015-10-10: 20 mg via INTRAVENOUS
  Administered 2015-10-10: 50 mg via INTRAVENOUS

## 2015-10-10 MED ORDER — SODIUM CHLORIDE 0.9 % IV SOLN: INTRAVENOUS | Status: DC

## 2015-10-10 MED ORDER — PROPOFOL 500 MG/50ML IV EMUL
INTRAVENOUS | Status: DC | PRN
  Administered 2015-10-10: 100 ug/kg/min via INTRAVENOUS

## 2015-10-10 MED ORDER — LACTATED RINGERS IV SOLN
INTRAVENOUS | Status: DC
  Administered 2015-10-10: 1000 mL via INTRAVENOUS

## 2015-10-10 MED ORDER — PROPOFOL 10 MG/ML IV BOLUS
INTRAVENOUS | Status: AC
  Filled 2015-10-10: qty 40

## 2015-10-10 SURGICAL SUPPLY — 14 items

## 2015-10-10 NOTE — Op Note (Signed)
[facility] [place], [address]   ENDOSCOPY PROCEDURE REPORT  PATIENT: Megan Horton, Megan Horton  MR#: [id number] BIRTHDATE: 1941-04-10 , 74  yrs. old GENDER: female ENDOSCOPIST:Camree Wigington Benson Norway, MD REFERRED BY: PROCEDURE DATE:  10-23-2015 PROCEDURE:   EGD, diagnostic ASA CLASS:    Class III INDICATIONS: epigastric pain. MEDICATION: Monitored anesthesia care TOPICAL ANESTHETIC:   none  DESCRIPTION OF PROCEDURE:   After the risks and benefits of the procedure were explained, informed consent was obtained.  The endoscope was introduced through the mouth  and advanced to the second portion of the duodenum .  The instrument was slowly withdrawn as the mucosa was fully examined. Estimated blood loss is zero unless otherwise noted in this procedure report.    FINDINGS: The esophagus and gastroesophageal junction were completely normal in appearance.  The stomach was entered and closely examined.The antrum, angularis, and lesser curvature were well visualized, including a retroflexed view of the cardia and fundus.  The stomach wall was normally distensable.  The scope passed easily through the pylorus into the duodenum.    Retroflexed views revealed no abnormalities.    The scope was then withdrawn from the patient and the procedure completed.  COMPLICATIONS: There were no immediate complications.  ENDOSCOPIC IMPRESSION: 1) Normal EGD.  RECOMMENDATIONS: 1)  Await MRI scan.   _______________________________ eSignedCarol Ada, MD 2015/10/23 1:27 PM     cc:  CPT CODES: ICD CODES:  The ICD and CPT codes recommended by this software are interpretations from the data that the clinical staff has captured with the software.  The verification of the translation of this report to the ICD and CPT codes and modifiers is the sole responsibility of the health care institution and practicing physician where this report was generated.  [place]. will not be held responsible for the validity of the ICD and CPT codes included on this report.  AMA assumes no liability for data contained or not contained herein. CPT is a Designer, television/film set of the [employer].

## 2015-10-10 NOTE — Discharge Instructions (Signed)

## 2015-10-10 NOTE — Transfer of Care (Signed)
Immediate Anesthesia Transfer of Care Note  Patient: Megan Horton  Procedure(s) Performed: Procedure(s): ESOPHAGOGASTRODUODENOSCOPY (EGD) WITH PROPOFOL (N/A)  Patient Location: PACU  Anesthesia Type:MAC  Level of Consciousness:  sedated, patient cooperative and responds to stimulation  Airway & Oxygen Therapy:Patient Spontanous Breathing and Patient connected to face mask oxgen  Post-op Assessment:  Report given to PACU RN and Post -op Vital signs reviewed and stable  Post vital signs:  Reviewed and stable  Last Vitals:  Filed Vitals:   10/10/15 1056 10/10/15 1328  BP: 144/60 138/55  Pulse: 69 69  Temp: 37.2 C 36.7 C  Resp: 13 13    Complications: No apparent anesthesia complications

## 2015-10-10 NOTE — Anesthesia Postprocedure Evaluation (Signed)
Anesthesia Post Note  Patient: Megan Horton  Procedure(s) Performed: Procedure(s) (LRB): ESOPHAGOGASTRODUODENOSCOPY (EGD) WITH PROPOFOL (N/A)  Patient location during evaluation: PACU Anesthesia Type: MAC Level of consciousness: awake and alert Pain management: pain level controlled Vital Signs Assessment: post-procedure vital signs reviewed and stable Respiratory status: spontaneous breathing Cardiovascular status: blood pressure returned to baseline Anesthetic complications: no    Last Vitals:  Filed Vitals:   10/10/15 1350 10/10/15 1400  BP: 153/82 160/64  Pulse: 57 60  Temp:    Resp: 12 13    Last Pain: There were no vitals filed for this visit.               Tiajuana Amass

## 2015-10-10 NOTE — Anesthesia Preprocedure Evaluation (Signed)
Anesthesia Evaluation  Patient identified by MRN, date of birth, ID band Patient awake    Reviewed: Allergy & Precautions, NPO status , Patient's Chart, lab work & pertinent test results  History of Anesthesia Complications Negative for: history of anesthetic complications  Airway Mallampati: III  TM Distance: >3 FB Neck ROM: Full    Dental  (+) Chipped, Dental Advisory Given   Pulmonary sleep apnea and Continuous Positive Airway Pressure Ventilation , former smoker,    breath sounds clear to auscultation       Cardiovascular hypertension, Pt. on medications  Rhythm:Regular Rate:Normal     Neuro/Psych negative neurological ROS     GI/Hepatic Neg liver ROS, GERD  Medicated and Controlled,  Endo/Other  diabetes, Oral Hypoglycemic AgentsHypothyroidism Morbid obesity  Renal/GU negative Renal ROS     Musculoskeletal  (+) Arthritis , Osteoarthritis,    Abdominal (+) + obese,   Peds  Hematology   Anesthesia Other Findings   Reproductive/Obstetrics                             Anesthesia Physical  Anesthesia Plan  ASA: III  Anesthesia Plan: MAC   Post-op Pain Management:    Induction: Intravenous  Airway Management Planned: Natural Airway and Nasal Cannula  Additional Equipment:   Intra-op Plan:   Post-operative Plan:   Informed Consent: I have reviewed the patients History and Physical, chart, labs and discussed the procedure including the risks, benefits and alternatives for the proposed anesthesia with the patient or authorized representative who has indicated his/her understanding and acceptance.   Dental advisory given  Plan Discussed with: CRNA and Surgeon  Anesthesia Plan Comments:         Anesthesia Quick Evaluation

## 2015-10-13 ENCOUNTER — Encounter (HOSPITAL_COMMUNITY): Payer: Self-pay | Admitting: Gastroenterology

## 2015-10-23 DIAGNOSIS — R110 Nausea: Secondary | ICD-10-CM | POA: Diagnosis not present

## 2015-10-23 DIAGNOSIS — I10 Essential (primary) hypertension: Secondary | ICD-10-CM | POA: Diagnosis not present

## 2015-10-23 DIAGNOSIS — E1122 Type 2 diabetes mellitus with diabetic chronic kidney disease: Secondary | ICD-10-CM | POA: Diagnosis not present

## 2015-10-23 DIAGNOSIS — R1033 Periumbilical pain: Secondary | ICD-10-CM | POA: Diagnosis not present

## 2015-10-27 ENCOUNTER — Ambulatory Visit
Admission: RE | Admit: 2015-10-27 | Discharge: 2015-10-27 | Disposition: A | Discharge status: Home / Self Care | Payer: Medicare Other | Source: Ambulatory Visit | Attending: Gastroenterology | Admitting: Gastroenterology

## 2015-10-27 DIAGNOSIS — R935 Abnormal findings on diagnostic imaging of other abdominal regions, including retroperitoneum: Secondary | ICD-10-CM

## 2015-10-27 DIAGNOSIS — K7689 Other specified diseases of liver: Secondary | ICD-10-CM | POA: Diagnosis not present

## 2015-10-27 MED ORDER — GADOXETATE DISODIUM 0.25 MMOL/ML IV SOLN
10.0000 mL | Freq: Once | INTRAVENOUS | Status: AC | PRN
  Administered 2015-10-27: 10 mL via INTRAVENOUS

## 2015-10-28 DIAGNOSIS — R634 Abnormal weight loss: Secondary | ICD-10-CM | POA: Diagnosis not present

## 2015-10-28 DIAGNOSIS — R1110 Vomiting, unspecified: Secondary | ICD-10-CM | POA: Diagnosis not present

## 2015-10-28 DIAGNOSIS — C229 Malignant neoplasm of liver, not specified as primary or secondary: Secondary | ICD-10-CM | POA: Diagnosis not present

## 2015-10-28 DIAGNOSIS — R933 Abnormal findings on diagnostic imaging of other parts of digestive tract: Secondary | ICD-10-CM | POA: Diagnosis not present

## 2015-11-04 ENCOUNTER — Telehealth: Payer: Self-pay | Admitting: *Deleted

## 2015-11-04 NOTE — Telephone Encounter (Signed)
Oncology Nurse Navigator Documentation  Oncology Nurse Navigator Flowsheets 11/04/2015  Referral date to RadOnc/MedOnc 10/30/2015  Navigator Encounter Type Introductory phone call  Spoke with patient and provided new patient appointment for 11/21/15 at 10:45/11:00 with Dr. Burr Medico. Informed of location of [place], valet service, and registration process. Reminded to bring insurance cards and a current medication list, including supplements. Patient verbalizes understanding.

## 2015-11-21 ENCOUNTER — Ambulatory Visit (HOSPITAL_BASED_OUTPATIENT_CLINIC_OR_DEPARTMENT_OTHER): Payer: Medicare Other

## 2015-11-21 ENCOUNTER — Telehealth: Payer: Self-pay | Admitting: Hematology

## 2015-11-21 ENCOUNTER — Ambulatory Visit (HOSPITAL_BASED_OUTPATIENT_CLINIC_OR_DEPARTMENT_OTHER): Payer: Medicare Other | Admitting: Hematology

## 2015-11-21 ENCOUNTER — Encounter: Payer: Self-pay | Admitting: *Deleted

## 2015-11-21 ENCOUNTER — Encounter: Payer: Self-pay | Admitting: Hematology

## 2015-11-21 VITALS — BP 147/76 | HR 55 | Temp 98.5°F | Resp 16 | Ht 64.25 in | Wt 224.9 lb

## 2015-11-21 DIAGNOSIS — R160 Hepatomegaly, not elsewhere classified: Secondary | ICD-10-CM

## 2015-11-21 DIAGNOSIS — K769 Liver disease, unspecified: Secondary | ICD-10-CM

## 2015-11-21 DIAGNOSIS — E119 Type 2 diabetes mellitus without complications: Secondary | ICD-10-CM | POA: Diagnosis not present

## 2015-11-21 DIAGNOSIS — C220 Liver cell carcinoma: Secondary | ICD-10-CM | POA: Diagnosis not present

## 2015-11-21 DIAGNOSIS — R1013 Epigastric pain: Secondary | ICD-10-CM

## 2015-11-21 DIAGNOSIS — I10 Essential (primary) hypertension: Secondary | ICD-10-CM | POA: Diagnosis not present

## 2015-11-21 DIAGNOSIS — E039 Hypothyroidism, unspecified: Secondary | ICD-10-CM | POA: Diagnosis not present

## 2015-11-21 DIAGNOSIS — D696 Thrombocytopenia, unspecified: Secondary | ICD-10-CM

## 2015-11-21 LAB — COMPREHENSIVE METABOLIC PANEL
ALT: 30 U/L (ref 0–55)
ANION GAP: 11 meq/L (ref 3–11)
AST: 27 U/L (ref 5–34)
Albumin: 4.1 g/dL (ref 3.5–5.0)
Alkaline Phosphatase: 126 U/L (ref 40–150)
BUN: 15.2 mg/dL (ref 7.0–26.0)
CALCIUM: 9.6 mg/dL (ref 8.4–10.4)
CHLORIDE: 107 meq/L (ref 98–109)
CO2: 24 mEq/L (ref 22–29)
Creatinine: 0.8 mg/dL (ref 0.6–1.1)
EGFR: 77 mL/min/{1.73_m2} — AB (ref >90)
Glucose: 91 mg/dl (ref 70–140)
POTASSIUM: 4 meq/L (ref 3.5–5.1)
Sodium: 142 mEq/L (ref 136–145)
Total Bilirubin: 0.71 mg/dL (ref 0.20–1.20)
Total Protein: 7.5 g/dL (ref 6.4–8.3)

## 2015-11-21 LAB — CBC WITH DIFFERENTIAL/PLATELET
BASO%: 0.9 % (ref 0.0–2.0)
BASOS ABS: 0.1 10*3/uL (ref 0.0–0.1)
EOS%: 6 % (ref 0.0–7.0)
Eosinophils Absolute: 0.3 10*3/uL (ref 0.0–0.5)
HEMATOCRIT: 41.4 % (ref 34.8–46.6)
HGB: 13.5 g/dL (ref 11.6–15.9)
LYMPH%: 26.5 % (ref 14.0–49.7)
MCH: 29.9 pg (ref 25.1–34.0)
MCHC: 32.6 g/dL (ref 31.5–36.0)
MCV: 91.8 fL (ref 79.5–101.0)
MONO#: 0.4 10*3/uL (ref 0.1–0.9)
MONO%: 7.6 % (ref 0.0–14.0)
NEUT#: 3.2 10*3/uL (ref 1.5–6.5)
NEUT%: 59 % (ref 38.4–76.8)
PLATELETS: 141 10*3/uL — AB (ref 145–400)
RBC: 4.51 10*6/uL (ref 3.70–5.45)
RDW: 14.1 % (ref 11.2–14.5)
WBC: 5.5 10*3/uL (ref 3.9–10.3)
lymph#: 1.5 10*3/uL (ref 0.9–3.3)
nRBC: 0 % (ref 0–0)

## 2015-11-21 NOTE — Telephone Encounter (Signed)
Confirmed appt and received avs °

## 2015-11-21 NOTE — Progress Notes (Signed)
[place]  [phone]) [phone] Fax:([phone]) [place] Note   Patient Care Team: Thressa Sheller, MD as PCP - General (Internal Medicine) 11/21/2015   REFERRAL PHYSICIAN: Dr. Carol Ada   CHIEF COMPLAINTS/PURPOSE OF CONSULTATION:  Liver lesion   HISTORY OF PRESENTING ILLNESS:  Megan Horton 75 y.o. female is here because of recent abnormal scan finding of a liver lesion.   She started having intermittent epigastric pain,  nausea, vomiting and weight loss 1.5 months ago, and presented to the ER on 10/10/2015 at [facility] for worsening symptoms. A CT scan was performed and she was noted to have 4.5 cm intralobar hypodense lesion in right love of liver and splenomegaly at 14.5 cm. There was also noted dilation of her portal vein consistent with portal HTN. She was sent to [facility] for further evaluation.She underwent endoscopy by Dr. Benson Norway on 10/10/2015, which was unremarkable. Abdominal MRI was subsequently obtained on 10/27/2015, which showed a 4.0 x 4.1 cm lesion in segment 8 of right liver lobe, with imaging features hepatocellular carcinoma. 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  She still has epigastric pain after meal, good appetite, gained some weight back, no nausea, mild constipation. She denies hematochezia, change of bowel habits, or other symptoms. She lives alone and tolerates daily activities without much difficulty.  She denies prior history of liver disease, no significant alcohol drinking history, no blood transfusion. She had a screening colonoscopy on 05/23/2015 which showed 5 small polyps which were removed.   MEDICAL HISTORY:  Past Medical History  Diagnosis Date  . Hypertension   . Peripheral vascular disease ([place])   . Seasonal allergies   . Diabetes mellitus ([place])   . Hypothyroidism   . GERD (gastroesophageal reflux disease)   . Arthritis   . Sleep apnea     CPAP, sleep study  3-4 years ago in [place], [place] Dr. Elbert Ewings   . Wears glasses   . Adhesive capsulitis of right shoulder   . Partial tear of rotator cuff(726.13) left  . Complication of anesthesia 5/13    did have some disorientation post cerv fusion due to low sats  . Left knee DJD   . Seasonal allergies   . Urgency of urination   . Hyperlipemia   . Peripheral neuropathy ([facility])     fingers     SURGICAL HISTORY: Past Surgical History  Procedure Laterality Date  . Cholecystectomy  2009  . Hemorrhoid surgery  2009  . Knee arthroscopy      Left x2  . Carpal tunnel release      bilat  . Cervical fusion    . Tonsillectomy    . Varicose vein surgery      Right  . Heel spur excision      bilat  . Nose surgery    . Anterior cervical decomp/discectomy fusion  03/10/2012    Procedure: ANTERIOR CERVICAL DECOMPRESSION/DISCECTOMY FUSION 1 LEVEL;  Surgeon: Eustace Moore, MD;  Location: [facility];  Service: Neurosurgery;  Laterality: N/A;  Cervical five-six anterior cervical decompression fusion with plating  . Eye surgery  2012    bilat cataract  . Shoulder arthroscopy  06/27/2012    Procedure: ARTHROSCOPY SHOULDER;  Surgeon: Lorn Junes, MD;  Location: [facility];  Service: Orthopedics;  Laterality: Right;  right shoulder arthroscopy debridement extensive, distal clavulectomy, with resect adhesions with maniplulation  . Colonoscopy w/ polypectomy    . Total knee arthroplasty Left  07/02/2013    Procedure: TOTAL KNEE ARTHROPLASTY- left;  Surgeon: Lorn Junes, MD;  Location: [facility];  Service: Orthopedics;  Laterality: Left;  . Colonoscopy with propofol N/A 05/23/2015    Procedure: COLONOSCOPY WITH PROPOFOL;  Surgeon: Carol Ada, MD;  Location: [facility];  Service: Endoscopy;  Laterality: N/A;  . Abdominal hysterectomy      complete  . Esophagogastroduodenoscopy (egd) with propofol N/A 10/10/2015    Procedure: ESOPHAGOGASTRODUODENOSCOPY (EGD) WITH PROPOFOL;  Surgeon: Carol Ada, MD;  Location: [facility];  Service: Endoscopy;  Laterality: N/A;    SOCIAL HISTORY: Social History   Social History  . Marital Status: Widowed    Spouse Name: N/A  . Number of Children: N/A  . Years of Education: N/A   Occupational History  . retired    Social History Main Topics  . Smoking status: Former Smoker -- 0.25 packs/day for 16 years    Types: Cigarettes    Quit date: 12/26/1993  . Smokeless tobacco: Never Used  . Alcohol Use: Yes     Comment: very rarely wine  . Drug Use: No  . Sexual Activity: Not on file   Other Topics Concern  . Not on file   Social History Narrative    FAMILY HISTORY: Family History  Problem Relation Age of Onset  . Heart disease Mother   . Heart disease Father   . Heart disease Brother   . Heart attack Father   . Heart attack Brother   . Heart attack Sister   . Hypertension Mother   . Hypertension Father   . Hypertension Sister   . Stroke Mother   . Stroke Brother     ALLERGIES:  is allergic to asa; adhesive; and tylenol.  MEDICATIONS:  Current Outpatient Prescriptions  Medication Sig Dispense Refill  . Albiglutide (TANZEUM) 30 MG PEN Inject 30 mg into the skin every 7 (seven) days. Sundays    . amLODipine (NORVASC) 5 MG tablet Take 5 mg by mouth every morning.     . cycloSPORINE (RESTASIS) 0.05 % ophthalmic emulsion Place 1 drop into both eyes 2 (two) times daily.    Marland Kitchen dexlansoprazole (DEXILANT) 60 MG capsule Take 60 mg by mouth every morning.     . DULoxetine (CYMBALTA) 60 MG capsule Take 60 mg by mouth daily.     Marland Kitchen esomeprazole (NEXIUM) 40 MG capsule Take 40 mg by mouth at bedtime.    . fluticasone (FLONASE) 50 MCG/ACT nasal spray Place 1 spray into both nostrils 2 (two) times daily as needed for allergies or rhinitis.     . furosemide (LASIX) 20 MG tablet Take 20 mg by mouth as needed. Prn    . gabapentin (NEURONTIN) 300 MG capsule Take 300 mg by mouth at bedtime.     . hydrochlorothiazide (MICROZIDE) 12.5 MG  capsule Take 12.5 mg by mouth every morning.     Marland Kitchen levothyroxine (SYNTHROID, LEVOTHROID) 100 MCG tablet Take 100 mcg by mouth at bedtime.     . lovastatin (MEVACOR) 40 MG tablet Take 40 mg by mouth at bedtime.    . metFORMIN (GLUCOPHAGE) 500 MG tablet Take 500 mg by mouth daily with breakfast.    . Multiple Vitamins-Minerals (CENTRUM SILVER PO) Take 1 tablet by mouth every morning.     . Olopatadine HCl (PATANASE) 0.6 % SOLN Place 1 each into the nose daily as needed (for allergies).    Marland Kitchen telmisartan (MICARDIS) 80 MG tablet Take 80 mg by mouth every morning.     Marland Kitchen  tetrahydrozoline (VISINE) 0.05 % ophthalmic solution Place 1 drop into both eyes 2 (two) times daily as needed (for allergies).     . ranitidine (ZANTAC) 150 MG tablet Take 150 mg by mouth at bedtime. Reported on 11/21/2015     No current facility-administered medications for this visit.    REVIEW OF SYSTEMS:   Constitutional: Denies fevers, chills or abnormal night sweats Eyes: Denies blurriness of vision, double vision or watery eyes Ears, nose, mouth, throat, and face: Denies mucositis or sore throat Respiratory: Denies cough, dyspnea or wheezes Cardiovascular: Denies palpitation, chest discomfort or lower extremity swelling Gastrointestinal:  Denies nausea, heartburn or change in bowel habits Skin: Denies abnormal skin rashes Lymphatics: Denies new lymphadenopathy or easy bruising Neurological:Denies numbness, tingling or new weaknesses Behavioral/Psych: Mood is stable, no new changes  All other systems were reviewed with the patient and are negative.  PHYSICAL EXAMINATION: ECOG PERFORMANCE STATUS: 1 - Symptomatic but completely ambulatory  Filed Vitals:   11/21/15 1056  BP: 147/76  Pulse: 55  Temp: 98.5 F (36.9 C)  Resp: 16   Filed Weights   11/21/15 1056  Weight: 224 lb 14.4 oz (102.014 kg)    GENERAL:alert, no distress and comfortable SKIN: skin color, texture, turgor are normal, no rashes or significant  lesions EYES: normal, conjunctiva are pink and non-injected, sclera clear OROPHARYNX:no exudate, no erythema and lips, buccal mucosa, and tongue normal  NECK: supple, thyroid normal size, non-tender, without nodularity LYMPH:  no palpable lymphadenopathy in the cervical, axillary or inguinal LUNGS: clear to auscultation and percussion with normal breathing effort HEART: regular rate & rhythm and no murmurs and no lower extremity edema ABDOMEN:abdomen soft, non-tender and normal bowel sounds Musculoskeletal:no cyanosis of digits and no clubbing  PSYCH: alert & oriented x 3 with fluent speech NEURO: no focal motor/sensory deficits  LABORATORY DATA:  I have reviewed the data as listed CBC Latest Ref Rng 10/07/2015 07/05/2013 07/04/2013  WBC 4.0 - 10.5 K/uL 5.0 8.9 9.7  Hemoglobin 12.0 - 15.0 g/dL 13.2 11.8(L) 10.7(L)  Hematocrit 36.0 - 46.0 % 41.2 35.6(L) 32.5(L)  Platelets 150 - 400 K/uL 129(L) 151 125(L)    Recent Labs  10/07/15 1438  NA 143  K 3.6  CL 107  CO2 30  GLUCOSE 109*  BUN 9  CREATININE 0.64  CALCIUM 9.2  GFRNONAA >60  GFRAA >60  PROT 6.5  ALBUMIN 3.8  AST 26  ALT 26  ALKPHOS 105  BILITOT 0.7    RADIOGRAPHIC STUDIES: I have personally reviewed the radiological images as listed and agreed with the findings in the report. Mr Abdomen W Wo Contrast  10/27/2015  CLINICAL DATA:  4.5 cm liver lesion on outside CT. Weight loss starting in July. Difficulty keeping food down. Vomiting. EXAM: MRI ABDOMEN WITHOUT AND WITH CONTRAST TECHNIQUE: Multiplanar multisequence MR imaging of the abdomen was performed both before and after the administration of intravenous contrast. CONTRAST:  10.0 ml Eovist, a mixed extracellular and hepatocyte specific contrast agent. COMPARISON:  None. FINDINGS: Lower chest: Normal heart size without pericardial or pleural effusion. Hepatobiliary: Mild hepatomegaly. Caudate lobe is mildly prominent, including on image 38/series 10. Within the central  portion of the anterior segment right liver lobe (segment 8) a 4.0 x 4.1 cm lesion is identified on image 28/series 5. This is mildly T2 hyperintense, T1 hypo intense. Demonstrates heterogeneous arterial phase hyper enhancement on image 29/ series 6. Portal venous phase and delayed hypo enhancement with a suggestion of a peripheral "Pseudo capsule" including  on image 29/series 9. No uptake on hepatobiliary phase images. Hepatic and portal veins patent. No intrahepatic biliary duct dilatation. Cholecystectomy without common duct dilatation. Pancreas: Tiny cystic foci within the pancreas, including a 6 mm area in the pancreatic head on image 23/series 14. No dominant mass or main pancreatic duct dilatation. Spleen: Normal in size, without focal abnormality. Adrenals/Urinary Tract: Tiny upper pole right renal lesion is likely a cyst. A dominant 5.6 cm lower pole right renal cyst. Normal left kidney, without hydronephrosis. Stomach/Bowel: Normal stomach and abdominal bowel loops. Vascular/Lymphatic: Normal caliber of the aorta and branch vessels. No retroperitoneal or retrocrural adenopathy. Other: No ascites. Musculoskeletal: No acute osseous abnormality. IMPRESSION: 1. Segment 8 liver lesion has suspicious imaging characteristics. Favor hepatocellular carcinoma. Correlate with risk factors for mild cirrhosis, given caudate lobe enlargement. An atypical appearance of metastatic disease could look similar. Tissue sampling should be considered. 2. No evidence of primary malignancy within the abdomen. 3. Mild hepatomegaly. 4. Tiny cystic foci in the pancreas are likely small pseudocysts or areas of duct ectasia. These could be re-evaluated on follow-up. If the patient does not otherwise undergo follow-up imaging, per consensus criteria, followup with pre and post contrast abdominal MRI at 1 year should be considered. This recommendation follows ACR consensus guidelines: Managing Incidental Findings on Abdominal CT: White  Paper of the ACR Incidental Findings Committee. [place] [phone] Electronically Signed   By: Abigail Miyamoto M.D.   On: 10/27/2015 14:53   ENDOSCOPIC IMPRESSION: 1) Normal EGD. RECOMMENDATIONS: 1) Await MRI scan.  ASSESSMENT & PLAN: 75 yo female with PMH of HTN, DM, arthritis, hypothyroidism, obesity, buthistory of liver disease or heavy drinking history, presented with epigastric pain, nausea, and weight loss. Abdominal CT and MRI showed a 4.1 cm mass in the right lobe of liver  1. Right lobe liver lesion, favor [place] -I reviewed the CT and MRI findings with pt in details. The liver lesion is very concern for malignancy, especially hepatocellular carcinoma. Although she does not have history of liver disease, image also showed portal hypertension and a mild liver cirrhosis, which is her high risk factor for [place]. As a primary or metastatic malignancy is also possible, especially cholangiocarcinoma. -I recommend to check AFP and CA 19.9. If her AFP is significantly elevated, it is certainly make the diagnosis of [place] more likely. -I'll present her case in our GI tumor Board next week, to get a consensus impression she needs liver biopsy for diagnosis. -We did discuss the treatment option of surgical resection, liver transplant versus liver targeted therapy such as ablation or TACE, if surgery is not an options.  -I will obtain a CT chest to rule out metastasis   2. HTN, DM, hypothyroidism -She'll continue follow-up with her primary care physician  Plan -CT chest with contrast  -Tumor board discussion -liver mass biopsy by IR  -lab today  -Return to clinic in 2 weeks for follow-up.  I'll see him back in 2 weeks and finalize his treatment plan. She will continue following up with Dr. Benson Norway for liver cirrhosis.   All questions were answered. The patient knows to call the clinic with any problems, questions or concerns. I spent 40 minutes counseling the patient face to face. The total  time spent in the appointment was 60 minutes and more than 50% was on counseling.     Truitt Merle, MD 11/21/2015

## 2015-11-21 NOTE — Progress Notes (Signed)
Oncology Nurse Navigator Documentation  Oncology Nurse Navigator Flowsheets 11/21/2015  Navigator Location [facility]  Navigator Encounter Type Initial MedOnc  Abnormal Finding Date 10/06/2015  Patient Visit Type MedOnc;Initial  Treatment Phase Pre-Tx/Tx Discussion;Abnormal Scans  Barriers/Navigation Needs Education  Education Understanding Cancer/ Treatment Options;Preparing for Upcoming Biopsy/ Treatment  Support Groups/Services GI Support Group  Acuity Level 1  Time Spent with Patient 60  Met with patient during new patient visit. Explained the role of the GI Nurse Navigator and provided New Patient Packet with information on: 1. Liver cancer (explained this is tentative-need bx)-provided printed info on liver biopsy procedure 2. Support groups 3. Fall Safety Plan Answered questions, reviewed current treatment plan using TEACH back and provided emotional support. Provided copy of current treatment plan. Stressed that she has many positive things to remember: her liver functions are still good; rest of her liver looks pretty good; there is only one tumor and not multiple. Added her case to GI Conference on 11/26/15 per MD request. Megan Horton has been widow since 2011. Lives alone in [place], [place]> Independent and drives. Enjoys working outdoors. 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 Has two dogs, that she says she loves (has had them since 2011). Son lives in [place] and daughter lives in [place]. Has two grandchildren from her daughter and two great grandchildren. Escorted her to scheduler with instructions to go by lab before leaving today.   , RN, BSN GI Oncology Navigator [facility]   

## 2015-11-22 ENCOUNTER — Encounter: Payer: Self-pay | Admitting: Hematology

## 2015-11-22 LAB — PROTHROMBIN TIME (PT)
INR: 1 (ref 0.8–1.2)
Prothrombin Time: 10.8 s (ref 9.1–12.0)

## 2015-11-22 LAB — AFP TUMOR MARKER: AFP, SERUM, TUMOR MARKER: 124.5 ng/mL — AB (ref 0.0–8.3)

## 2015-11-22 LAB — APTT: aPTT: 26 s (ref 24–33)

## 2015-11-22 LAB — CANCER ANTIGEN 19-9: CAN 19-9: 23 U/mL (ref 0–35)

## 2015-11-24 ENCOUNTER — Telehealth: Payer: Self-pay | Admitting: *Deleted

## 2015-11-24 NOTE — Telephone Encounter (Signed)
Oncology Nurse Navigator Documentation  Oncology Nurse Navigator Flowsheets 11/24/2015  Navigator Location [facility]  Navigator Encounter Type Telephone  Telephone Outgoing Call  Abnormal Finding Date -  Patient Visit Type -  Treatment Phase Pre-Tx/Tx Discussion  Barriers/Navigation Needs Coordination of Care  Education -  Interventions Coordination of Care  Coordination of Care Radiology--scheduled CT chest for 11/25/15 at 2:45/3:00 at [facility]-patient aware and NPO 4 hours prior  Support Groups/Services -  Acuity Level 2  Time Spent with Patient 15

## 2015-11-25 ENCOUNTER — Encounter (HOSPITAL_COMMUNITY): Payer: Self-pay

## 2015-11-25 ENCOUNTER — Ambulatory Visit (HOSPITAL_COMMUNITY)
Admission: RE | Admit: 2015-11-25 | Discharge: 2015-11-25 | Disposition: A | Discharge status: Home / Self Care | Payer: Medicare Other | Source: Ambulatory Visit | Attending: Hematology | Admitting: Hematology

## 2015-11-25 DIAGNOSIS — R160 Hepatomegaly, not elsewhere classified: Secondary | ICD-10-CM | POA: Insufficient documentation

## 2015-11-25 DIAGNOSIS — R918 Other nonspecific abnormal finding of lung field: Secondary | ICD-10-CM | POA: Diagnosis not present

## 2015-11-25 DIAGNOSIS — K769 Liver disease, unspecified: Secondary | ICD-10-CM | POA: Diagnosis not present

## 2015-11-25 MED ORDER — IOHEXOL 300 MG/ML  SOLN
75.0000 mL | Freq: Once | INTRAMUSCULAR | Status: AC | PRN
  Administered 2015-11-25: 75 mL via INTRAVENOUS

## 2015-11-27 ENCOUNTER — Telehealth: Payer: Self-pay | Admitting: Hematology

## 2015-11-27 ENCOUNTER — Other Ambulatory Visit: Payer: Self-pay | Admitting: *Deleted

## 2015-11-27 DIAGNOSIS — C220 Liver cell carcinoma: Secondary | ICD-10-CM

## 2015-11-27 NOTE — Telephone Encounter (Signed)
I called pt to share the GI tumor board recommendations. Her liver lesion is likely [place], but not definitive given her no significant underline liver abnormality. We recommend liver mass biopsy, which is scheduled for next Monday. I will see her back next Friday. I also discussed her CT chest result with her which was negative for metastases.   Megan Horton  11/27/2015

## 2015-11-28 ENCOUNTER — Encounter: Payer: Self-pay | Admitting: Nutrition

## 2015-11-28 ENCOUNTER — Telehealth: Payer: Self-pay | Admitting: Hematology

## 2015-11-28 ENCOUNTER — Other Ambulatory Visit: Payer: Self-pay | Admitting: Radiology

## 2015-11-28 NOTE — Telephone Encounter (Signed)
Spoke to patient about nutrition appointment 1/25 per 1/19 pof

## 2015-12-01 ENCOUNTER — Ambulatory Visit (HOSPITAL_COMMUNITY): Payer: Medicare Other

## 2015-12-01 ENCOUNTER — Encounter (HOSPITAL_COMMUNITY): Payer: Self-pay

## 2015-12-01 ENCOUNTER — Ambulatory Visit (HOSPITAL_COMMUNITY)
Admission: RE | Admit: 2015-12-01 | Discharge: 2015-12-01 | Disposition: A | Discharge status: Home / Self Care | Payer: Medicare Other | Source: Ambulatory Visit | Attending: Hematology | Admitting: Hematology

## 2015-12-01 ENCOUNTER — Other Ambulatory Visit: Payer: Self-pay | Admitting: Hematology

## 2015-12-01 DIAGNOSIS — I739 Peripheral vascular disease, unspecified: Secondary | ICD-10-CM | POA: Diagnosis not present

## 2015-12-01 DIAGNOSIS — Z7984 Long term (current) use of oral hypoglycemic drugs: Secondary | ICD-10-CM | POA: Diagnosis not present

## 2015-12-01 DIAGNOSIS — G4730 Sleep apnea, unspecified: Secondary | ICD-10-CM | POA: Diagnosis not present

## 2015-12-01 DIAGNOSIS — Z87891 Personal history of nicotine dependence: Secondary | ICD-10-CM | POA: Diagnosis not present

## 2015-12-01 DIAGNOSIS — Z8249 Family history of ischemic heart disease and other diseases of the circulatory system: Secondary | ICD-10-CM | POA: Diagnosis not present

## 2015-12-01 DIAGNOSIS — R160 Hepatomegaly, not elsewhere classified: Secondary | ICD-10-CM | POA: Diagnosis not present

## 2015-12-01 DIAGNOSIS — K769 Liver disease, unspecified: Secondary | ICD-10-CM | POA: Diagnosis not present

## 2015-12-01 DIAGNOSIS — M7501 Adhesive capsulitis of right shoulder: Secondary | ICD-10-CM | POA: Insufficient documentation

## 2015-12-01 DIAGNOSIS — E785 Hyperlipidemia, unspecified: Secondary | ICD-10-CM | POA: Diagnosis not present

## 2015-12-01 DIAGNOSIS — K219 Gastro-esophageal reflux disease without esophagitis: Secondary | ICD-10-CM | POA: Diagnosis not present

## 2015-12-01 DIAGNOSIS — E1142 Type 2 diabetes mellitus with diabetic polyneuropathy: Secondary | ICD-10-CM | POA: Diagnosis not present

## 2015-12-01 DIAGNOSIS — E1151 Type 2 diabetes mellitus with diabetic peripheral angiopathy without gangrene: Secondary | ICD-10-CM | POA: Diagnosis not present

## 2015-12-01 DIAGNOSIS — M1712 Unilateral primary osteoarthritis, left knee: Secondary | ICD-10-CM | POA: Diagnosis not present

## 2015-12-01 DIAGNOSIS — C220 Liver cell carcinoma: Secondary | ICD-10-CM | POA: Diagnosis not present

## 2015-12-01 DIAGNOSIS — E039 Hypothyroidism, unspecified: Secondary | ICD-10-CM | POA: Insufficient documentation

## 2015-12-01 DIAGNOSIS — I10 Essential (primary) hypertension: Secondary | ICD-10-CM | POA: Insufficient documentation

## 2015-12-01 LAB — GLUCOSE, CAPILLARY: Glucose-Capillary: 108 mg/dL — ABNORMAL HIGH (ref 65–99)

## 2015-12-01 MED ORDER — FENTANYL CITRATE (PF) 100 MCG/2ML IJ SOLN
INTRAMUSCULAR | Status: AC
  Filled 2015-12-01: qty 4

## 2015-12-01 MED ORDER — OXYCODONE HCL 5 MG PO TABS
5.0000 mg | ORAL_TABLET | ORAL | Status: DC | PRN
  Administered 2015-12-01: 5 mg via ORAL
  Filled 2015-12-01 (×3): qty 1

## 2015-12-01 MED ORDER — MIDAZOLAM HCL 2 MG/2ML IJ SOLN
INTRAMUSCULAR | Status: AC | PRN
  Administered 2015-12-01 (×2): 1 mg via INTRAVENOUS

## 2015-12-01 MED ORDER — MIDAZOLAM HCL 2 MG/2ML IJ SOLN
INTRAMUSCULAR | Status: AC
  Filled 2015-12-01: qty 6

## 2015-12-01 MED ORDER — SODIUM CHLORIDE 0.9 % IV SOLN
INTRAVENOUS | Status: DC
  Administered 2015-12-01: 10:00:00 via INTRAVENOUS

## 2015-12-01 MED ORDER — NALOXONE HCL 0.4 MG/ML IJ SOLN
INTRAMUSCULAR | Status: AC
  Filled 2015-12-01: qty 1

## 2015-12-01 MED ORDER — FLUMAZENIL 0.5 MG/5ML IV SOLN
INTRAVENOUS | Status: AC
  Filled 2015-12-01: qty 5

## 2015-12-01 MED ORDER — FENTANYL CITRATE (PF) 100 MCG/2ML IJ SOLN
INTRAMUSCULAR | Status: AC | PRN
  Administered 2015-12-01 (×2): 50 ug via INTRAVENOUS

## 2015-12-01 NOTE — Discharge Instructions (Signed)
Liver Biopsy, Care After Refer to this sheet in the next few weeks. These instructions provide you with information on caring for yourself after your procedure. Your health care provider may also give you more specific instructions. Your treatment has been planned according to current medical practices, but problems sometimes occur. Call your health care provider if you have any problems or questions after your procedure. WHAT TO EXPECT AFTER THE PROCEDURE After your procedure, it is typical to have the following:  A small amount of discomfort in the area where the biopsy was done and in the right shoulder or shoulder blade.  A small amount of bruising around the area where the biopsy was done and on the skin over the liver.  Sleepiness and fatigue for the rest of the day. HOME CARE INSTRUCTIONS   Rest at home for 1-2 days or as directed by your health care provider.  Have a friend or family member stay with you for at least 24 hours.  Because of the medicines used during the procedure, you should not do the following things in the first 24 hours:  Drive.  Use machinery.  Be responsible for the care of other people.  Sign legal documents.  Take a bath or shower.  There are many different ways to close and cover an incision, including stitches, skin glue, and adhesive strips. Follow your health care provider's instructions on:  Incision care.  Bandage (dressing) changes and removal. May remove bandaid and shower in 24 hours post procedure. Keep wound clean and dry. Report signs of infection.  Incision closure removal.  Do not drink alcohol in the first week.  Do not lift more than 5 pounds or play contact sports for 2 weeks after this test.  Take medicines only as directed by your health care provider. Do not take medicine containing aspirin or non-steroidal anti-inflammatory medicines such as ibuprofen for 1 week after this test.  It is your responsibility to get your test  results. SEEK MEDICAL CARE IF:   You have increased bleeding from an incision that results in more than a small spot of blood.  You have redness, swelling, or increasing pain in any incisions.  You notice a discharge or a bad smell coming from any of your incisions.  You have a fever or chills. SEEK IMMEDIATE MEDICAL CARE IF:   You develop swelling, bloating, or pain in your abdomen.  You become dizzy or faint.  You develop a rash.  You are nauseous or vomit.  You have difficulty breathing, feel short of breath, or feel faint.  You develop chest pain.  You have problems with your speech or vision.  You have trouble balancing or moving your arms or legs.   This information is not intended to replace advice given to you by your health care provider. Make sure you discuss any questions you have with your health care provider.   Document Released: 05/14/2005 Document Revised: 11/15/2014 Document Reviewed: 12/21/2013 Elsevier Interactive Patient Education 2016 Elsevier [employer] Liver Biopsy The liver is a large organ in the upper right-hand side of your abdomen. A liver biopsy is a procedure in which a tissue sample is taken from the liver and examined under a microscope. The procedure is done to confirm a suspected problem. There are three types of liver biopsies:  Percutaneous. In this type, an incision is made in your abdomen. The sample is removed through the incision with a needle.  Laparoscopic. In this type, several incisions are made in  the abdomen. A tiny camera is passed through one of the incisions to help guide the health care provider. The sample is removed through the other incision or incisions.  Transjugular. In this type, an incision is made in the neck. A tube is passed through the incision to the liver. The sample is removed through the tube with a needle. LET [facility] CARE PROVIDER KNOW ABOUT:  Any allergies you have.  All medicines you are taking,  including vitamins, herbs, eye drops, creams, and over-the-counter medicines.  Previous problems you or members of your family have had with the use of anesthetics.  Any blood disorders you have.  Previous surgeries you have had.  Medical conditions you have.  Possibility of pregnancy, if this applies. RISKS AND COMPLICATIONS Generally, this is a safe procedure. However, problems can occur and include:  Bleeding.  Infection.  Bruising.  Collapsed lung.  Leak of digestive juices (bile) from the liver or gallbladder.  Problems with heart rhythm.  Pain at the biopsy site or in the right shoulder.  Low blood pressure (hypotension).  Injury to nearby organs or tissues. BEFORE THE PROCEDURE  Your health care provider may do some blood or urine tests. These will help your health care provider learn how well your kidneys and liver are working and how well your blood clots.  Ask your health care provider if you will be able to go home the day of the procedure. Arrange for someone to take you home and stay with you for at least 24 hours.  Do not eat or drink anything after midnight on the night before the procedure or as directed by your health care provider.  Ask your health care provider about:  Changing or stopping your regular medicines. This is especially important if you are taking diabetes medicines or blood thinners.  Taking medicines such as aspirin and ibuprofen. These medicines can thin your blood. Do not take these medicines before your procedure if your health care provider asks you not to. PROCEDURE Regardless of the type of biopsy that will be done, you will have an IV line placed. Through this line, you will receive fluids and medicine to relax you. If you will be having a laparoscopic biopsy, you may also receive medicine through this line to make you sleep during the procedure (general anesthetic). Percutaneous Liver Biopsy  You will positioned on your back,  with your right hand over your head.  A health care provider will locate your liver by tapping and pressing on the right side of your abdomen or with the help of an ultrasound machine or CT scan.  An area at the bottom of your last right rib will be numbed.  An incision will be made in the numbed area.  The biopsy needle will be inserted into the incision.  Several samples of liver tissue will be taken with the biopsy needle. You will be asked to hold your breath as each sample is taken. Laparoscopic Liver Biopsy  You will be positioned on your back.  Several small incisions will be made in your abdomen.  Your doctor will pass a tiny camera through one incision. The camera will allow the liver to be viewed on a TV monitor in the operating room.  Tools will be passed through the other incision or incisions. These tools will be used to remove samples of liver tissue. Transjugular Liver Biopsy  You will be positioned on your back on an X-ray table, with your head turned to  your left.  An area on your neck just over your jugular vein will be numbed.  An incision will be made in the numbed area.  A tiny tube will be inserted through the incision. It will be pushed through the jugular vein to a blood vessel in the liver called the hepatic vein.  Dye will be inserted through the tube, and X-rays will be taken. The dye will make the blood vessels in the liver light up on the X-rays.  The biopsy needle will be pushed through the tube until it reaches the liver.  Samples of liver tissue will be taken with the biopsy needle.  The needle and the tube will be removed. After the samples are obtained, the incision or incisions will be closed. AFTER THE PROCEDURE  You will be taken to a recovery area.  You may have to lie on your right side for 1-2 hours. This will prevent bleeding from the biopsy site.  Your progress will be watched. Your blood pressure, pulse, and the biopsy site will  be checked often.  You may have some pain or feel sick. If this happens, tell your health care provider.  As you begin to feel better, you will be offered ice and beverages.  You may be allowed to go home when the medicines have worn off and you can walk, drink, eat, and use the bathroom.   This information is not intended to replace advice given to you by your health care provider. Make sure you discuss any questions you have with your health care provider.   Document Released: 01/15/2004 Document Revised: 11/15/2014 Document Reviewed: 12/21/2013 Elsevier Interactive Patient Education 2016 Elsevier Inc. Moderate Conscious Sedation, Adult Sedation is the use of medicines to promote relaxation and relieve discomfort and anxiety. Moderate conscious sedation is a type of sedation. Under moderate conscious sedation you are less alert than normal but are still able to respond to instructions or stimulation. Moderate conscious sedation is used during short medical and dental procedures. It is milder than deep sedation or general anesthesia and allows you to return to your regular activities sooner. LET [facility] CARE PROVIDER KNOW ABOUT:   Any allergies you have.  All medicines you are taking, including vitamins, herbs, eye drops, creams, and over-the-counter medicines.  Use of steroids (by mouth or creams).  Previous problems you or members of your family have had with the use of anesthetics.  Any blood disorders you have.  Previous surgeries you have had.  Medical conditions you have.  Possibility of pregnancy, if this applies.  Use of cigarettes, alcohol, or illegal drugs. RISKS AND COMPLICATIONS Generally, this is a safe procedure. However, as with any procedure, problems can occur. Possible problems include:  Oversedation.  Trouble breathing on your own. You may need to have a breathing tube until you are awake and breathing on your own.  Allergic reaction to any of the  medicines used for the procedure. BEFORE THE PROCEDURE  You may have blood tests done. These tests can help show how well your kidneys and liver are working. They can also show how well your blood clots.  A physical exam will be done.  Only take medicines as directed by your health care provider. You may need to stop taking medicines (such as blood thinners, aspirin, or nonsteroidal anti-inflammatory drugs) before the procedure.   Do not eat or drink at least 6 hours before the procedure or as directed by your health care provider.  Arrange for a  responsible adult, family member, or friend to take you home after the procedure. He or she should stay with you for at least 24 hours after the procedure, until the medicine has worn off. PROCEDURE   An intravenous (IV) catheter will be inserted into one of your veins. Medicine will be able to flow directly into your body through this catheter. You may be given medicine through this tube to help prevent pain and help you relax.  The medical or dental procedure will be done. AFTER THE PROCEDURE  You will stay in a recovery area until the medicine has worn off. Your blood pressure and pulse will be checked.   Depending on the procedure you had, you may be allowed to go home when you can tolerate liquids and your pain is under control.   This information is not intended to replace advice given to you by your health care provider. Make sure you discuss any questions you have with your health care provider.   Document Released: 07/20/2001 Document Revised: 11/15/2014 Document Reviewed: 07/02/2013 Elsevier Interactive Patient Education 2016 Elsevier Inc. Moderate Conscious Sedation, Adult, Care After Refer to this sheet in the next few weeks. These instructions provide you with information on caring for yourself after your procedure. Your health care provider may also give you more specific instructions. Your treatment has been planned according  to current medical practices, but problems sometimes occur. Call your health care provider if you have any problems or questions after your procedure. WHAT TO EXPECT AFTER THE PROCEDURE  After your procedure:  You may feel sleepy, clumsy, and have poor balance for several hours.  Vomiting may occur if you eat too soon after the procedure. HOME CARE INSTRUCTIONS  Do not participate in any activities where you could become injured for at least 24 hours. Do not:  Drive.  Swim.  Ride a bicycle.  Operate heavy machinery.  Cook.  Use power tools.  Climb ladders.  Work from a high place.  Do not make important decisions or sign legal documents until you are improved.  If you vomit, drink water, juice, or soup when you can drink without vomiting. Make sure you have little or no nausea before eating solid foods.  Only take over-the-counter or prescription medicines for pain, discomfort, or fever as directed by your health care provider.  Make sure you and your family fully understand everything about the medicines given to you, including what side effects may occur.  You should not drink alcohol, take sleeping pills, or take medicines that cause drowsiness for at least 24 hours.  If you smoke, do not smoke without supervision.  If you are feeling better, you may resume normal activities 24 hours after you were sedated.  Keep all appointments with your health care provider. SEEK MEDICAL CARE IF:  Your skin is pale or bluish in color.  You continue to feel nauseous or vomit.  Your pain is getting worse and is not helped by medicine.  You have bleeding or swelling.  You are still sleepy or feeling clumsy after 24 hours. SEEK IMMEDIATE MEDICAL CARE IF:  You develop a rash.  You have difficulty breathing.  You develop any type of allergic problem.  You have a fever. MAKE SURE YOU:  Understand these instructions.  Will watch your condition.  Will get help right  away if you are not doing well or get worse.   This information is not intended to replace advice given to you by your health care  provider. Make sure you discuss any questions you have with your health care provider.   Document Released: 08/15/2013 Document Revised: 11/15/2014 Document Reviewed: 08/15/2013 Elsevier Interactive Patient Education [employer].

## 2015-12-01 NOTE — H&P (Signed)
Chief Complaint: Patient was seen in consultation today for liver lesion biopsy at the request of Feng,Yan  Referring Physician(s): Feng,Yan  History of Present Illness: Megan Horton is a 75 y.o. female with newly found hepatic lesion suspicious for [facility], but not definitive. She is referred for Image guided biopsy PMHx, meds, labs, imaging reviewed. Has been NPO this am.  Past Medical History  Diagnosis Date  . Hypertension   . Peripheral vascular disease ([place])   . Seasonal allergies   . Diabetes mellitus ([place])   . Hypothyroidism   . GERD (gastroesophageal reflux disease)   . Arthritis   . Sleep apnea     CPAP, sleep study 3-4 years ago in [place], [place] Dr. Elbert Ewings   . Wears glasses   . Adhesive capsulitis of right shoulder   . Partial tear of rotator cuff(726.13) left  . Complication of anesthesia 5/13    did have some disorientation post cerv fusion due to low sats  . Left knee DJD   . Seasonal allergies   . Urgency of urination   . Hyperlipemia   . Peripheral neuropathy ([facility])     fingers     Past Surgical History  Procedure Laterality Date  . Cholecystectomy  2009  . Hemorrhoid surgery  2009  . Knee arthroscopy      Left x2  . Carpal tunnel release      bilat  . Cervical fusion    . Tonsillectomy    . Varicose vein surgery      Right  . Heel spur excision      bilat  . Nose surgery    . Anterior cervical decomp/discectomy fusion  03/10/2012    Procedure: ANTERIOR CERVICAL DECOMPRESSION/DISCECTOMY FUSION 1 LEVEL;  Surgeon: Eustace Moore, MD;  Location: [place] [facility];  Service: Neurosurgery;  Laterality: N/A;  Cervical five-six anterior cervical decompression fusion with plating  . Eye surgery  2012    bilat cataract  . Shoulder arthroscopy  06/27/2012    Procedure: ARTHROSCOPY SHOULDER;  Surgeon: Lorn Junes, MD;  Location: [place];  Service: Orthopedics;  Laterality: Right;  right shoulder arthroscopy debridement extensive,  distal clavulectomy, with resect adhesions with maniplulation  . Colonoscopy w/ polypectomy    . Total knee arthroplasty Left 07/02/2013    Procedure: TOTAL KNEE ARTHROPLASTY- left;  Surgeon: Lorn Junes, MD;  Location: [place] [facility];  Service: Orthopedics;  Laterality: Left;  . Colonoscopy with propofol N/A 05/23/2015    Procedure: COLONOSCOPY WITH PROPOFOL;  Surgeon: Carol Ada, MD;  Location: [facility];  Service: Endoscopy;  Laterality: N/A;  . Abdominal hysterectomy      complete  . Esophagogastroduodenoscopy (egd) with propofol N/A 10/10/2015    Procedure: ESOPHAGOGASTRODUODENOSCOPY (EGD) WITH PROPOFOL;  Surgeon: Carol Ada, MD;  Location: [facility];  Service: Endoscopy;  Laterality: N/A;    Allergies: Asa; Adhesive; and Tylenol  Medications: Prior to Admission medications   Medication Sig Start Date End Date Taking? Authorizing Provider  amLODipine (NORVASC) 5 MG tablet Take 5 mg by mouth every morning.    Yes Historical Provider, MD  dexlansoprazole (DEXILANT) 60 MG capsule Take 60 mg by mouth every morning.    Yes Historical Provider, MD  DULoxetine (CYMBALTA) 60 MG capsule Take 60 mg by mouth daily.    Yes Historical Provider, MD  esomeprazole (NEXIUM) 40 MG capsule Take 40 mg by mouth at bedtime.   Yes Historical Provider, MD  hydrochlorothiazide (MICROZIDE) 12.5 MG capsule Take 12.5  mg by mouth every morning.    Yes Historical Provider, MD  levothyroxine (SYNTHROID, LEVOTHROID) 100 MCG tablet Take 100 mcg by mouth at bedtime.    Yes Historical Provider, MD  lovastatin (MEVACOR) 40 MG tablet Take 40 mg by mouth at bedtime.   Yes Historical Provider, MD  metFORMIN (GLUCOPHAGE) 500 MG tablet Take 500 mg by mouth daily with breakfast.   Yes Historical Provider, MD  Multiple Vitamins-Minerals (CENTRUM SILVER PO) Take 1 tablet by mouth every morning.    Yes Historical Provider, MD  ranitidine (ZANTAC) 150 MG tablet Take 150 mg by mouth at bedtime. Reported on 11/21/2015   Yes  Historical Provider, MD  telmisartan (MICARDIS) 80 MG tablet Take 80 mg by mouth every morning.    Yes Historical Provider, MD  Albiglutide (TANZEUM) 30 MG PEN Inject 30 mg into the skin every 7 (seven) days. Sundays    Historical Provider, MD  cycloSPORINE (RESTASIS) 0.05 % ophthalmic emulsion Place 1 drop into both eyes 2 (two) times daily.    Historical Provider, MD  fluticasone (FLONASE) 50 MCG/ACT nasal spray Place 1 spray into both nostrils 2 (two) times daily as needed for allergies or rhinitis.     Historical Provider, MD  furosemide (LASIX) 20 MG tablet Take 20 mg by mouth as needed. Prn 10/20/15   Historical Provider, MD  gabapentin (NEURONTIN) 300 MG capsule Take 300 mg by mouth at bedtime.     Historical Provider, MD  Olopatadine HCl (PATANASE) 0.6 % SOLN Place 1 each into the nose daily as needed (for allergies).    Historical Provider, MD  tetrahydrozoline (VISINE) 0.05 % ophthalmic solution Place 1 drop into both eyes 2 (two) times daily as needed (for allergies).     Historical Provider, MD     Family History  Problem Relation Age of Onset  . Heart disease Mother   . Heart disease Father   . Heart disease Brother   . Heart attack Father   . Heart attack Brother   . Heart attack Sister   . Hypertension Mother   . Hypertension Father   . Hypertension Sister   . Stroke Mother   . Stroke Brother     Social History   Social History  . Marital Status: Widowed    Spouse Name: N/A  . Number of Children: N/A  . Years of Education: N/A   Occupational History  . retired    Social History Main Topics  . Smoking status: Former Smoker -- 0.50 packs/day for 35 years    Types: Cigarettes    Quit date: 12/26/1993  . Smokeless tobacco: Never Used  . Alcohol Use: Yes     Comment: very rarely wine, on holidays   . Drug Use: No  . Sexual Activity: Not Asked   Other Topics Concern  . None   Social History Narrative     Review of Systems: A 12 point ROS discussed and  pertinent positives are indicated in the HPI above.  All other systems are negative.  Review of Systems  Vital Signs: T: 97.8, BP: 65, RR: 16, BP: 152/65  Physical Exam  Constitutional: She is oriented to person, place, and time. She appears well-developed and well-nourished. No distress.  HENT:  Head: Normocephalic.  Mouth/Throat: Oropharynx is clear and moist.  Neck: Normal range of motion. No tracheal deviation present.  Cardiovascular: Normal rate, regular rhythm and normal heart sounds.   Pulmonary/Chest: Effort normal and breath sounds normal. No respiratory distress. She has  no wheezes.  Abdominal: Soft. She exhibits no mass. There is no tenderness.  Neurological: She is alert and oriented to person, place, and time.  Psychiatric: She has a normal mood and affect. Judgment normal.    Mallampati Score:  MD Evaluation Airway: WNL Heart: WNL Abdomen: WNL Chest/ Lungs: WNL ASA  Classification: 2 Mallampati/Airway Score: Two  Imaging: Ct Chest W Contrast  11/25/2015  CLINICAL DATA:  Liver mass, evaluate for metastasis EXAM: CT CHEST WITH CONTRAST TECHNIQUE: Multidetector CT imaging of the chest was performed during intravenous contrast administration. CONTRAST:  71mL OMNIPAQUE IOHEXOL 300 MG/ML  SOLN COMPARISON:  Chest radiographs dated 10/07/2015. Partial comparison MRI abdomen dated 10/27/2015. FINDINGS: Mediastinum/Nodes: Heart is normal in size. No pericardial effusion. 1.2 x 1.6 cm soft tissue lesion in the superior mediastinum (series 2/image 18), possibly reflecting a prevascular node. Visualized thyroid is unremarkable. Lungs/Pleura: Mild subpleural reticulation in the left lower lobe. No focal consolidation. No suspicious pulmonary nodules. No pleural effusion or pneumothorax. Upper abdomen: Visualized upper abdomen is notable for an ill-defined 3.6 cm lesion in the central right liver, corresponding to the MRI abnormality, suspicious for primary liver neoplasm  (hepatocellular carcinoma) versus metastasis. Cholecystectomy clips. Musculoskeletal: Mild degenerative changes of the visualized thoracolumbar spine. IMPRESSION: Ill-defined 3.6 cm lesion in the central right liver, corresponding to the MRI abnormality, suspicious for hepatocellular carcinoma versus metastasis. No evidence of metastatic disease in the chest. Electronically Signed   By: Julian Hy M.D.   On: 11/25/2015 16:34    Labs:  CBC:  Recent Labs  10/07/15 1438 11/21/15 1257  WBC 5.0 5.5  HGB 13.2 13.5  HCT 41.2 41.4  PLT 129* 141*    COAGS: No results for input(s): INR, APTT in the last 8760 hours.  BMP:  Recent Labs  10/07/15 1438 11/21/15 1257  NA 143 142  K 3.6 4.0  CL 107  --   CO2 30 24  GLUCOSE 109* 91  BUN 9 15.2  CALCIUM 9.2 9.6  CREATININE 0.64 0.8  GFRNONAA >60  --   GFRAA >60  --     LIVER FUNCTION TESTS:  Recent Labs  10/07/15 1438 11/21/15 1257  BILITOT 0.7 0.71  AST 26 27  ALT 26 30  ALKPHOS 105 126  PROT 6.5 7.5  ALBUMIN 3.8 4.1    TUMOR MARKERS: No results for input(s): AFPTM, CEA, CA199, CHROMGRNA in the last 8760 hours.  Assessment and Plan: Hepatic lesion/mass For CT/US guided biopsy Labs reviewed, ok Risks and Benefits discussed with the patient including, but not limited to bleeding, infection, damage to adjacent structures or low yield requiring additional tests. All of the patient's questions were answered, patient is agreeable to proceed. Consent signed and in chart.    Thank you for this interesting consult.    A copy of this report was sent to the requesting provider on this date.  Electronically Signed: Ascencion Dike 12/01/2015, 10:02 AM   I spent a total of 20 minutes in face to face in clinical consultation, greater than 50% of which was counseling/coordinating care for liver lesion biopsy

## 2015-12-01 NOTE — Progress Notes (Signed)
Pt was medicated for pain in right shoulder and right side post liver bx. VSS bandaid CDI Ascencion Dike PA radiology stopped by to check on patient. Warm blanket applied to right shoulder and abdomen for comfort while pain medication is taking effect

## 2015-12-01 NOTE — Procedures (Signed)
Successful [place] Bx of the central Rt liver mass No comp Stable Path pending Full report in pacs

## 2015-12-03 ENCOUNTER — Ambulatory Visit: Payer: Medicare Other | Admitting: Nutrition

## 2015-12-03 NOTE — Progress Notes (Signed)
75 year old female with suspected hepatocellular cancer.  She is a patient of Dr. Burr Medico.  Past medical history includes hypertension, PVD, diabetes, hypothyroidism, GERD, arthritis, hyperlipidemia.  Medications include Cymbalta, Nexium, Lasix, Synthroid, Glucophage, multivitamin.  Labs include albumin 4.1 on January 13.  Height: 64 inches. Weight: 223 pounds. Usual body weight: 250 pounds per patient. BMI: 37.98.  Patient reports unintentional weight loss in the fall and before the holidays which led her to see her doctor. Patient reports she currently follows a low concentrated sweets, low simple carbohydrate diet. Diet appears to be healthy and includes foods from a variety of sources. Patient denies nutrition impact symptoms.  Nutrition diagnosis:  Unintended weight loss related to possible hepatocellular cancer as evidenced by 27 pound weight loss from usual body weight.  Intervention:  I educated patient to continue to choose healthy, high protein foods. Provided a list of high protein foods and ways patient can incorporate more protein into her current diet. Encouraged patient consume one calories smart boost a day providing 16 g protein Cautioned patient to avoid weight loss greater than 2 pounds weekly. Fact sheets were provided.  Questions were answered.  Teach back method was used.  Monitoring, evaluation, goals: Patient will tolerate adequate calories and protein to minimize weight loss.  Next visit: To be scheduled as needed.  **Disclaimer: This note was dictated with voice recognition software. Similar sounding words can inadvertently be transcribed and this note may contain transcription errors which may not have been corrected upon publication of note.**

## 2015-12-05 ENCOUNTER — Telehealth: Payer: Self-pay | Admitting: Hematology

## 2015-12-05 ENCOUNTER — Encounter: Payer: Self-pay | Admitting: Hematology

## 2015-12-05 ENCOUNTER — Ambulatory Visit (HOSPITAL_BASED_OUTPATIENT_CLINIC_OR_DEPARTMENT_OTHER): Payer: Medicare Other | Admitting: Hematology

## 2015-12-05 ENCOUNTER — Other Ambulatory Visit (HOSPITAL_BASED_OUTPATIENT_CLINIC_OR_DEPARTMENT_OTHER): Payer: Medicare Other

## 2015-12-05 VITALS — BP 155/61 | HR 60 | Temp 98.0°F | Resp 18 | Wt 229.2 lb

## 2015-12-05 DIAGNOSIS — C220 Liver cell carcinoma: Secondary | ICD-10-CM | POA: Diagnosis not present

## 2015-12-05 DIAGNOSIS — E119 Type 2 diabetes mellitus without complications: Secondary | ICD-10-CM

## 2015-12-05 DIAGNOSIS — E039 Hypothyroidism, unspecified: Secondary | ICD-10-CM | POA: Diagnosis not present

## 2015-12-05 DIAGNOSIS — R160 Hepatomegaly, not elsewhere classified: Secondary | ICD-10-CM

## 2015-12-05 DIAGNOSIS — I10 Essential (primary) hypertension: Secondary | ICD-10-CM

## 2015-12-05 LAB — CBC WITH DIFFERENTIAL/PLATELET
BASO%: 0.9 % (ref 0.0–2.0)
Basophils Absolute: 0 10*3/uL (ref 0.0–0.1)
EOS%: 5.8 % (ref 0.0–7.0)
Eosinophils Absolute: 0.3 10*3/uL (ref 0.0–0.5)
HEMATOCRIT: 39.9 % (ref 34.8–46.6)
HEMOGLOBIN: 12.8 g/dL (ref 11.6–15.9)
LYMPH#: 1 10*3/uL (ref 0.9–3.3)
LYMPH%: 22.2 % (ref 14.0–49.7)
MCH: 29.2 pg (ref 25.1–34.0)
MCHC: 32 g/dL (ref 31.5–36.0)
MCV: 91 fL (ref 79.5–101.0)
MONO#: 0.5 10*3/uL (ref 0.1–0.9)
MONO%: 10.5 % (ref 0.0–14.0)
NEUT%: 60.6 % (ref 38.4–76.8)
NEUTROS ABS: 2.8 10*3/uL (ref 1.5–6.5)
PLATELETS: 105 10*3/uL — AB (ref 145–400)
RBC: 4.38 10*6/uL (ref 3.70–5.45)
RDW: 13.5 % (ref 11.2–14.5)
WBC: 4.7 10*3/uL (ref 3.9–10.3)

## 2015-12-05 LAB — COMPREHENSIVE METABOLIC PANEL
ALT: 22 U/L (ref 0–55)
AST: 21 U/L (ref 5–34)
Albumin: 3.7 g/dL (ref 3.5–5.0)
Alkaline Phosphatase: 120 U/L (ref 40–150)
Anion Gap: 8 mEq/L (ref 3–11)
BUN: 12.1 mg/dL (ref 7.0–26.0)
CALCIUM: 9.4 mg/dL (ref 8.4–10.4)
CHLORIDE: 110 meq/L — AB (ref 98–109)
CO2: 28 meq/L (ref 22–29)
Creatinine: 0.8 mg/dL (ref 0.6–1.1)
EGFR: 79 mL/min/{1.73_m2} — ABNORMAL LOW (ref >90)
Glucose: 82 mg/dl (ref 70–140)
POTASSIUM: 4 meq/L (ref 3.5–5.1)
Sodium: 146 mEq/L — ABNORMAL HIGH (ref 136–145)
Total Bilirubin: 0.6 mg/dL (ref 0.20–1.20)
Total Protein: 6.9 g/dL (ref 6.4–8.3)

## 2015-12-05 NOTE — Telephone Encounter (Signed)
per pof to sch pt referral w/Dr Barry Dienes. Cld CSS office spoke to [place] and gaqve all demo & ins information. She put me on hold and disconned call no appt was given.Will call office on 1/30 again to speak w/her

## 2015-12-05 NOTE — Progress Notes (Signed)
[facility]  [phone] [phone]  Clinic follow Up Note   Patient Care Team: Thressa Sheller, MD as PCP - General (Internal Medicine) 12/05/2015    CHIEF COMPLAINTS:  Follow up Megan Horton LP   HISTORY OF PRESENTING ILLNESS (11/21/2015):  Megan Horton 75 y.o. female is here because of recent abnormal scan finding of a liver lesion.   She started having intermittent epigastric pain,  nausea, vomiting and weight loss 1.5 months ago, and presented to the ER on 10/10/2015 at [facility] for worsening symptoms. A CT scan was performed and she was noted to have 4.5 cm intralobar hypodense lesion in right love of liver and splenomegaly at 14.5 cm. There was also noted dilation of her portal vein consistent with portal HTN. She was sent to [facility] for further evaluation.She underwent endoscopy by Dr. Benson Norway on 10/10/2015, which was unremarkable. Abdominal MRI was subsequently obtained on 10/27/2015, which showed a 4.0 x 4.1 cm lesion in segment 8 of right liver lobe, with imaging features hepatocellular carcinoma. 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  She still has epigastric pain after meal, good appetite, gained some weight back, no nausea, mild constipation. She denies hematochezia, change of bowel habits, or other symptoms. She lives alone and tolerates daily activities without much difficulty.  She denies prior history of liver disease, no significant alcohol drinking history, no blood transfusion. She had a screening colonoscopy on 05/23/2015 which showed 5 small polyps which were removed.  CURRENT THERAPY: pending  INTERIM HISTORY: Megan Horton  Returns for follo. She is accompanied by her son today. She underwent liver biopsy by interventional radiology earlier this week, and tolerated the procedure well. She has mild discomfort at the biopsy site, no other complaints. No epigastric pain lately.   MEDICAL HISTORY:  Past  Medical History  Diagnosis Date  . Hypertension   . Peripheral vascular disease ([place])   . Seasonal allergies   . Diabetes mellitus ([place])   . Hypothyroidism   . GERD (gastroesophageal reflux disease)   . Arthritis   . Sleep apnea     CPAP, sleep study 3-4 years ago in [place], [place] Dr. Elbert Ewings   . Wears glasses   . Adhesive capsulitis of right shoulder   . Partial tear of rotator cuff(726.13) left  . Complication of anesthesia 5/13    did have some disorientation post cerv fusion due to low sats  . Left knee DJD   . Seasonal allergies   . Urgency of urination   . Hyperlipemia   . Peripheral neuropathy ([facility])     fingers     SURGICAL HISTORY: Past Surgical History  Procedure Laterality Date  . Cholecystectomy  2009  . Hemorrhoid surgery  2009  . Knee arthroscopy      Left x2  . Carpal tunnel release      bilat  . Cervical fusion    . Tonsillectomy    . Varicose vein surgery      Right  . Heel spur excision      bilat  . Nose surgery    . Anterior cervical decomp/discectomy fusion  03/10/2012    Procedure: ANTERIOR CERVICAL DECOMPRESSION/DISCECTOMY FUSION 1 LEVEL;  Surgeon: Eustace Moore, MD;  Location: [facility];  Service: Neurosurgery;  Laterality: N/A;  Cervical five-six anterior cervical decompression fusion with plating  . Eye surgery  2012    bilat cataract  . Shoulder arthroscopy  06/27/2012    Procedure: ARTHROSCOPY SHOULDER;  Surgeon: Lorn Junes, MD;  Location: [facility];  Service: Orthopedics;  Laterality: Right;  right shoulder arthroscopy debridement extensive, distal clavulectomy, with resect adhesions with maniplulation  . Colonoscopy w/ polypectomy    . Total knee arthroplasty Left 07/02/2013    Procedure: TOTAL KNEE ARTHROPLASTY- left;  Surgeon: Lorn Junes, MD;  Location: [facility];  Service: Orthopedics;  Laterality: Left;  . Colonoscopy with propofol N/A 05/23/2015    Procedure: COLONOSCOPY WITH PROPOFOL;  Surgeon: Carol Ada, MD;  Location: [facility];  Service: Endoscopy;  Laterality: N/A;  . Abdominal hysterectomy      complete  . Esophagogastroduodenoscopy (egd) with propofol N/A 10/10/2015    Procedure: ESOPHAGOGASTRODUODENOSCOPY (EGD) WITH PROPOFOL;  Surgeon: Carol Ada, MD;  Location: [facility];  Service: Endoscopy;  Laterality: N/A;    SOCIAL HISTORY: Social History   Social History  . Marital Status: Widowed    Spouse Name: N/A  . Number of Children: N/A  . Years of Education: N/A   Occupational History  . retired    Social History Main Topics  . Smoking status: Former Smoker -- 0.50 packs/day for 35 years    Types: Cigarettes    Quit date: 12/26/1993  . Smokeless tobacco: Never Used  . Alcohol Use: Yes     Comment: very rarely wine, on holidays   . Drug Use: No  . Sexual Activity: Not on file   Other Topics Concern  . Not on file   Social History Narrative    FAMILY HISTORY: Family History  Problem Relation Age of Onset  . Heart disease Mother   . Heart disease Father   . Heart disease Brother   . Heart attack Father   . Heart attack Brother   . Heart attack Sister   . Hypertension Mother   . Hypertension Father   . Hypertension Sister   . Stroke Mother   . Stroke Brother     ALLERGIES:  is allergic to asa; adhesive; and tylenol.  MEDICATIONS:  Current Outpatient Prescriptions  Medication Sig Dispense Refill  . Albiglutide (TANZEUM) 30 MG PEN Inject 30 mg into the skin every 7 (seven) days. Sundays    . amLODipine (NORVASC) 5 MG tablet Take 5 mg by mouth every morning.     . cycloSPORINE (RESTASIS) 0.05 % ophthalmic emulsion Place 1 drop into both eyes 2 (two) times daily.    Marland Kitchen dexlansoprazole (DEXILANT) 60 MG capsule Take 60 mg by mouth every morning.     . DULoxetine (CYMBALTA) 60 MG capsule Take 60 mg by mouth daily.     Marland Kitchen esomeprazole (NEXIUM) 40 MG capsule Take 40 mg by mouth at bedtime.    . fluticasone (FLONASE) 50 MCG/ACT nasal spray Place 1 spray  into both nostrils 2 (two) times daily as needed for allergies or rhinitis.     . furosemide (LASIX) 20 MG tablet Take 20 mg by mouth as needed. Prn    . gabapentin (NEURONTIN) 300 MG capsule Take 300 mg by mouth at bedtime.     . hydrochlorothiazide (MICROZIDE) 12.5 MG capsule Take 12.5 mg by mouth every morning.     Marland Kitchen levothyroxine (SYNTHROID, LEVOTHROID) 100 MCG tablet Take 100 mcg by mouth at bedtime.     . lovastatin (MEVACOR) 40 MG tablet Take 40 mg by mouth at bedtime.    . metFORMIN (GLUCOPHAGE) 500 MG tablet Take 500 mg by mouth daily with breakfast.    . Multiple Vitamins-Minerals (CENTRUM SILVER PO) Take  1 tablet by mouth every morning.     . Olopatadine HCl (PATANASE) 0.6 % SOLN Place 1 each into the nose daily as needed (for allergies).    . ranitidine (ZANTAC) 150 MG tablet Take 150 mg by mouth at bedtime. Reported on 11/21/2015    . telmisartan (MICARDIS) 80 MG tablet Take 80 mg by mouth every morning.     Marland Kitchen tetrahydrozoline (VISINE) 0.05 % ophthalmic solution Place 1 drop into both eyes 2 (two) times daily as needed (for allergies).      No current facility-administered medications for this visit.    REVIEW OF SYSTEMS:   Constitutional: Denies fevers, chills or abnormal night sweats Eyes: Denies blurriness of vision, double vision or watery eyes Ears, nose, mouth, throat, and face: Denies mucositis or sore throat Respiratory: Denies cough, dyspnea or wheezes Cardiovascular: Denies palpitation, chest discomfort or lower extremity swelling Gastrointestinal:  Denies nausea, heartburn or change in bowel habits Skin: Denies abnormal skin rashes Lymphatics: Denies new lymphadenopathy or easy bruising Neurological:Denies numbness, tingling or new weaknesses Behavioral/Psych: Mood is stable, no new changes  All other systems were reviewed with the patient and are negative.  PHYSICAL EXAMINATION: ECOG PERFORMANCE STATUS: 1 - Symptomatic but completely ambulatory  Filed Vitals:     12/05/15 1237  BP: 155/61  Pulse: 60  Temp: 98 F (36.7 C)  Resp: 18   Filed Weights   12/05/15 1237  Weight: 229 lb 3.2 oz (103.964 kg)    GENERAL:alert, no distress and comfortable SKIN: skin color, texture, turgor are normal, no rashes or significant lesions EYES: normal, conjunctiva are pink and non-injected, sclera clear OROPHARYNX:no exudate, no erythema and lips, buccal mucosa, and tongue normal  NECK: supple, thyroid normal size, non-tender, without nodularity LYMPH:  no palpable lymphadenopathy in the cervical, axillary or inguinal LUNGS: clear to auscultation and percussion with normal breathing effort HEART: regular rate & rhythm and no murmurs and no lower extremity edema ABDOMEN:abdomen soft, non-tender and normal bowel sounds Musculoskeletal:no cyanosis of digits and no clubbing  PSYCH: alert & oriented x 3 with fluent speech NEURO: no focal motor/sensory deficits  LABORATORY DATA:  I have reviewed the data as listed CBC Latest Ref Rng 11/21/2015 10/07/2015 07/05/2013  WBC 3.9 - 10.3 10e3/uL 5.5 5.0 8.9  Hemoglobin 11.6 - 15.9 g/dL 13.5 13.2 11.8(L)  Hematocrit 34.8 - 46.6 % 41.4 41.2 35.6(L)  Platelets 145 - 400 10e3/uL 141(L) 129(L) 151    Recent Labs  10/07/15 1438 11/21/15 1257  NA 143 142  K 3.6 4.0  CL 107  --   CO2 30 24  GLUCOSE 109* 91  BUN 9 15.2  CREATININE 0.64 0.8  CALCIUM 9.2 9.6  GFRNONAA >60  --   GFRAA >60  --   PROT 6.5 7.5  ALBUMIN 3.8 4.1  AST 26 27  ALT 26 30  ALKPHOS 105 126  BILITOT 0.7 0.71   AFP tumor marker  Status: Finalresult Visible to patient:  MyChart Nextappt: 06/02/2016 at 10:00 AM in Pulmonology (Tammy Parrett, NP) Dx:  Liver mass, right lobe            Ref Range 2wk ago    AFP, Serum, Tumor Marker 0.0 - 8.3 ng/mL 124.5 (H)        CA 19.9 (Order [id number])      CA 19.9  Status: Finalresult Visible to patient:  MyChart Nextappt: 06/02/2016 at 10:00 AM in Pulmonology  (Rexene Edison, NP) Dx:  Liver mass, right lobe  Ref Range 2wk ago    CA 19-9 0 - 35 U/mL 23          PATHOLOGY REPORT  Diagnosis 12/01/2015 Liver, needle/core biopsy, central right mass HEPATOCELLULAR CARCINOMA, MODERATELY DIFFERENTIATED. Microscopic Comment The biopsy shows diffuse malignant lesions which composed of well and moderately differentiated neoplastic hepatocytes. The carcinoma stains focal positive for hepar-1, negative for ck7. Reticulin and cd 34 highlights the proliferation pattern. The morphology and immunostaining pattern support the above diagnosis. This case also reviewed by Dr. Saralyn Pilar and agree.   RADIOGRAPHIC STUDIES: I have personally reviewed the radiological images as listed and agreed with the findings in the report. Ct Chest W Contrast  11/25/2015  CLINICAL DATA:  Liver mass, evaluate for metastasis EXAM: CT CHEST WITH CONTRAST TECHNIQUE: Multidetector CT imaging of the chest was performed during intravenous contrast administration. CONTRAST:  32mL OMNIPAQUE IOHEXOL 300 MG/ML  SOLN COMPARISON:  Chest radiographs dated 10/07/2015. Partial comparison MRI abdomen dated 10/27/2015. FINDINGS: Mediastinum/Nodes: Heart is normal in size. No pericardial effusion. 1.2 x 1.6 cm soft tissue lesion in the superior mediastinum (series 2/image 18), possibly reflecting a prevascular node. Visualized thyroid is unremarkable. Lungs/Pleura: Mild subpleural reticulation in the left lower lobe. No focal consolidation. No suspicious pulmonary nodules. No pleural effusion or pneumothorax. Upper abdomen: Visualized upper abdomen is notable for an ill-defined 3.6 cm lesion in the central right liver, corresponding to the MRI abnormality, suspicious for primary liver neoplasm (hepatocellular carcinoma) versus metastasis. Cholecystectomy clips. Musculoskeletal: Mild degenerative changes of the visualized thoracolumbar spine. IMPRESSION: Ill-defined 3.6 cm lesion in the  central right liver, corresponding to the MRI abnormality, suspicious for hepatocellular carcinoma versus metastasis. No evidence of metastatic disease in the chest. Electronically Signed   By: Julian Hy M.D.   On: 11/25/2015 16:34   US Biopsy  12/01/2015  CLINICAL DATA:  Indeterminate central right liver mass within segment 8 EXAM: ULTRASOUND GUIDED CORE BIOPSY OF CENTRAL RIGHT LIVER MASS MEDICATIONS: 2.0 mg IV Versed; 100 mcg IV Fentanyl Total Moderate Sedation Time: 10 MINUTES, the patient's level of consciousness and physiological status was monitored by radiology nursing. PROCEDURE: The procedure, risks, benefits, and alternatives were explained to the patient. Questions regarding the procedure were encouraged and answered. The patient understands and consents to the procedure. Previous imaging reviewed. Preliminary ultrasound performed of the abdomen. The central right liver mass was localized in the right upper quadrant through an anterior lower intercostal space. The right upper quadrant was prepped with ChloraPrep in a sterile fashion, and a sterile drape was applied covering the operative field. A sterile gown and sterile gloves were used for the procedure. Local anesthesia was provided with 1% Lidocaine. Under sterile conditions and local anesthesia, a 17 gauge 11.8 cm access needle was advanced from an anterior oblique approach into the central liver lesion. Needle position confirmed with ultrasound. 2 18 gauge core biopsies obtained. Needle tract embolized with Gel-Foam. Patient tolerated the procedure well. No immediate complication. COMPLICATIONS: None immediate FINDINGS: Imaging confirms needle placement in the central right liver mass for core biopsy IMPRESSION: Successful ultrasound central right liver mass 18 gauge core biopsy Electronically Signed   By: Jerilynn Mages.  Shick M.D.   On: 12/01/2015 11:58   ENDOSCOPIC IMPRESSION: 1) Normal EGD. RECOMMENDATIONS: 1) Await MRI scan.  ASSESSMENT &  PLAN: 75 yo female with PMH of HTN, DM, arthritis, hypothyroidism, obesity, buthistory of liver disease or heavy drinking history, presented with epigastric pain, nausea, and weight loss. Abdominal CT and MRI showed a 4.1  cm mass in the right lobe of liver  1. Hepatocellular carcinoma of rght lobe -I reviewed the CT and MRI findings with pt in details. The liver lesion is very concern for malignancy, especially hepatocellular carcinoma. Although she does not have history of liver disease, image also showed portal hypertension and a mild liver cirrhosis, which is her high risk factor for [place].  -I reviewed her liver mass biopsy result, which confirmed Terminous.  -her APF is significantly elevated, CA19.9 normal, consistent with HCC  -CT chest was negative for metastasis. This was discussed with patient -We discussed the treatment option of surgical resection, liver transplant versus liver targeted therapy such as ablation or TACE, if surgery is not an options. Given her normal LFTs, and no overt liver cirrhosis on images, I think she would likely be a candidate for surgical resection. I will refer her to Dr. Barry Dienes -I'll present her case in our tumor board again next week, to discuss the treatment options. -We discussed cancer surveillance after surgery. Since she is going to follow up with Dr. Benson Norway for her liver issue, she prefers to follow-up with Dr. Benson Norway   2. ? Early liver cirrhosis  -Her liver MRI showed mild hepatomegaly, questionable early cirrhosis -Her liver function is normal, no history of liver disease. -Since liver cancer usually happens in abnormal liver, I will refer her back to her gastroenterologist Dr. Benson Norway for further workup about her underlying liver disease. I sent a message to Dr. Benson Norway today    3. HTN, DM, hypothyroidism -She'll continue follow-up with her primary care physician  Plan -Tumor board discussion next week  -Surgical referral to Dr. Barry Dienes, and return to Dr. Benson Norway for  follow up. I sent a message to them today.  -I'll see her back after surgery if needed. She will likely follow-up with Dr. Benson Norway.  -I'll call her after the tumor Board discussion next Wednesday.   All questions were answered. The patient knows to call the clinic with any problems, questions or concerns. I spent 30 minutes counseling the patient face to face. The total time spent in the appointment was 40 minutes and more than 50% was on counseling.     Truitt Merle, MD 12/05/2015

## 2015-12-07 ENCOUNTER — Other Ambulatory Visit: Payer: Self-pay | Admitting: Hematology

## 2015-12-10 ENCOUNTER — Telehealth: Payer: Self-pay | Admitting: *Deleted

## 2015-12-10 ENCOUNTER — Telehealth: Payer: Self-pay | Admitting: Hematology

## 2015-12-10 NOTE — Telephone Encounter (Signed)
Oncology Nurse Navigator Documentation  Oncology Nurse Navigator Flowsheets 12/10/2015  Navigator Location [facility]  Navigator Encounter Type Telephone  Telephone Outgoing Call;Appt Confirmation/Clarification  Abnormal Finding Date -  Patient Visit Type -  Treatment Phase -  Barriers/Navigation Needs -  Education -  Interventions Called to offer her to be seen in [facility] on 12/12/15 at 0930 by Dr. Edger House agreed and was appreciative.  Coordination of Care -  Support Groups/Services -  Acuity -  Time Spent with Patient -

## 2015-12-10 NOTE — Telephone Encounter (Signed)
per pof to sch pt appt-Jessica from [facility] cld and gave sch appt time-per Manuela Schwartz C(navigator)pt appt moved upand she has talked with patient

## 2015-12-12 ENCOUNTER — Encounter: Payer: Self-pay | Admitting: Hematology

## 2015-12-12 ENCOUNTER — Telehealth: Payer: Self-pay | Admitting: Hematology

## 2015-12-12 ENCOUNTER — Other Ambulatory Visit: Payer: Self-pay | Admitting: General Surgery

## 2015-12-12 ENCOUNTER — Encounter: Payer: Self-pay | Admitting: *Deleted

## 2015-12-12 ENCOUNTER — Other Ambulatory Visit: Payer: Self-pay | Admitting: *Deleted

## 2015-12-12 ENCOUNTER — Ambulatory Visit (HOSPITAL_COMMUNITY)
Admission: RE | Admit: 2015-12-12 | Discharge: 2015-12-12 | Disposition: A | Discharge status: Home / Self Care | Payer: Medicare Other | Source: Ambulatory Visit | Attending: General Surgery | Admitting: General Surgery

## 2015-12-12 DIAGNOSIS — C220 Liver cell carcinoma: Secondary | ICD-10-CM | POA: Diagnosis not present

## 2015-12-12 DIAGNOSIS — K7581 Nonalcoholic steatohepatitis (NASH): Secondary | ICD-10-CM | POA: Diagnosis not present

## 2015-12-12 DIAGNOSIS — R0602 Shortness of breath: Secondary | ICD-10-CM | POA: Diagnosis not present

## 2015-12-12 DIAGNOSIS — M79662 Pain in left lower leg: Secondary | ICD-10-CM | POA: Diagnosis not present

## 2015-12-12 DIAGNOSIS — R600 Localized edema: Secondary | ICD-10-CM | POA: Diagnosis not present

## 2015-12-12 DIAGNOSIS — K7460 Unspecified cirrhosis of liver: Secondary | ICD-10-CM | POA: Diagnosis not present

## 2015-12-12 NOTE — Progress Notes (Signed)
[place] Psychosocial Distress Screening Clinical Social Work    Clinical Social Work met with pt today at [facility] today to introduce self, explain role of CSW/Pt and Family Support team and to review the distress screening protocol. CSW discussed common emotions, coping techniques and other resources to assist. The patient scored a 0 on the Psychosocial Distress Thermometer which indicates no distress. Pt reports she is doing well and is not concerned about her diagnosis currently. She plans to drive herself to treatment and feels she can continue to do so. Pt reports she is very active and likes to stay busy by working in her yard, walking her dogs and "anything that needs to be done". Pt shared several good coping techniques in case she gets anxious. Pt encouraged to attend activities at [facility] and [place] reviewed calendar. Pt agrees to reach out if needs arise. She reports to have support from a local son.   ONCBCN DISTRESS SCREENING 12/12/2015  Screening Type Initial Screening  Distress experienced in past week (1-10) 0  Spiritual/Religous concerns type Relating to God  Physical Problem type Tingling hands/feet;Sexual problems;Skin dry/itchy;Swollen arms/legs  Physician notified of physical symptoms Yes  Referral to clinical psychology No  Referral to clinical social work Yes  Referral to dietition Yes  Referral to financial advocate No  Referral to support programs Yes    Clinical Social Worker follow up needed: No.  If yes, follow up plan:  Loren Racer, [place]  [facility] Phone: [phone] Fax: [phone]

## 2015-12-12 NOTE — Progress Notes (Signed)
forms left in box °

## 2015-12-12 NOTE — Progress Notes (Unsigned)
Patient here to see Dr. Barry Dienes ([place]). Presented with a Claim Form for her

## 2015-12-12 NOTE — Progress Notes (Signed)
Oncology Nurse Navigator Documentation  Oncology Nurse Navigator Flowsheets 12/12/2015  Navigator Location [facility]  Navigator Encounter Type Clinic/[facility]C  Telephone -  Abnormal Finding Date -  Patient Visit Type Surgery  Treatment Phase -  Barriers/Navigation Needs Family concerns;Coordination of Care  Education Preparing for Upcoming Surgery/ Treatment  Interventions Coordination of Care--message to nurse at [facility] to work on the cardiac and GI referrals  Coordination of Care Radiology--scheduled BLE venous doppler for 11:30 today; sent referral to IR  Support Groups/Services -  Acuity -  Time Spent with Patient 45  @ 1330-Sent message to patient that her doppler was negative for DVT-Md also notified.

## 2015-12-12 NOTE — Progress Notes (Signed)
*  PRELIMINARY RESULTS* Vascular Ultrasound Lower extremity venous duplex has been completed.  Preliminary findings: No evidence of DVT or baker's cyst.  Attempted call report to Dr. Barry Dienes. Left voice message with results.    Landry Mellow, RDMS, RVT  12/12/2015, 11:17 AM

## 2015-12-12 NOTE — Progress Notes (Signed)
Received cancer policy claim form from patient and forwarded to managed care department.

## 2015-12-12 NOTE — Telephone Encounter (Signed)
I called pt and left a message for her at home, and also called and spoke with her son, regarding our tumor board discussion this week. Her biopsy confirmed HCC, and Dr. Barry Dienes is seeing her today to discuss surgery. He voiced good understanding and appreciated the call.   Truitt Merle  12/12/2015

## 2015-12-15 ENCOUNTER — Encounter: Payer: Self-pay | Admitting: Hematology

## 2015-12-15 NOTE — Progress Notes (Signed)
Mailing forms to patient. She needs to sign and get billing from hospital.

## 2015-12-15 NOTE — Progress Notes (Signed)
I sent copy of form to medical records and held in file. Original mailed to patient and note she has to call billing for her copy of bills.

## 2015-12-15 NOTE — Progress Notes (Signed)
placed form for dr. Burr Medico to sign

## 2015-12-22 DIAGNOSIS — E6609 Other obesity due to excess calories: Secondary | ICD-10-CM | POA: Diagnosis not present

## 2015-12-22 DIAGNOSIS — E119 Type 2 diabetes mellitus without complications: Secondary | ICD-10-CM | POA: Diagnosis not present

## 2015-12-22 DIAGNOSIS — K7460 Unspecified cirrhosis of liver: Secondary | ICD-10-CM | POA: Diagnosis not present

## 2015-12-23 ENCOUNTER — Ambulatory Visit
Admission: RE | Admit: 2015-12-23 | Discharge: 2015-12-23 | Disposition: A | Discharge status: Home / Self Care | Payer: Medicare Other | Source: Ambulatory Visit | Attending: General Surgery | Admitting: General Surgery

## 2015-12-23 DIAGNOSIS — C220 Liver cell carcinoma: Secondary | ICD-10-CM

## 2015-12-23 NOTE — Consult Note (Signed)
Chief Complaint: Moderately differentiated hepatocellular carcinoma. Assess for interventional therapies.  Referring Physician(s): Byerly,Faera  History of Present Illness: Megan Horton is a 75 y.o. female who presented to the emergency room in [place] in December with abdominal pain, nausea and vomiting. Abdominal ultrasound performed demonstrated a central right hepatic lobe 4.5cm lesion. She underwent additional follow-up MRI imaging confirming the mass within segment 8 between the right and middle hepatic veins. Minor cirrhotic changes suggested of the liver. Patent portal vein. She denies any significant alcohol use, drug abuse, or hepatitis in the past. Previous colonoscopies have been negative for cancer. She underwent ultrasound biopsy of the mass which revealed moderately differentiated hepatocellular carcinoma. She is being evaluated for surgical and interventional therapies.  Overall she is asymptomatic. No significant abdominal or flank pain. No interval fevers. Overall very good functional status.   Past Medical History  Diagnosis Date  . Hypertension   . Peripheral vascular disease ([place])   . Seasonal allergies   . Diabetes mellitus ([place])   . Hypothyroidism   . GERD (gastroesophageal reflux disease)   . Arthritis   . Sleep apnea     CPAP, sleep study 3-4 years ago in [place], [place] Dr. Elbert Ewings   . Wears glasses   . Adhesive capsulitis of right shoulder   . Partial tear of rotator cuff(726.13) left  . Complication of anesthesia 5/13    did have some disorientation post cerv fusion due to low sats  . Left knee DJD   . Seasonal allergies   . Urgency of urination   . Hyperlipemia   . Peripheral neuropathy ([facility])     fingers     Past Surgical History  Procedure Laterality Date  . Cholecystectomy  2009  . Hemorrhoid surgery  2009  . Knee arthroscopy      Left x2  . Carpal tunnel release      bilat  . Cervical fusion    . Tonsillectomy    . Varicose  vein surgery      Right  . Heel spur excision      bilat  . Nose surgery    . Anterior cervical decomp/discectomy fusion  03/10/2012    Procedure: ANTERIOR CERVICAL DECOMPRESSION/DISCECTOMY FUSION 1 LEVEL;  Surgeon: Eustace Moore, MD;  Location: [facility];  Service: Neurosurgery;  Laterality: N/A;  Cervical five-six anterior cervical decompression fusion with plating  . Eye surgery  2012    bilat cataract  . Shoulder arthroscopy  06/27/2012    Procedure: ARTHROSCOPY SHOULDER;  Surgeon: Lorn Junes, MD;  Location: [facility];  Service: Orthopedics;  Laterality: Right;  right shoulder arthroscopy debridement extensive, distal clavulectomy, with resect adhesions with maniplulation  . Colonoscopy w/ polypectomy    . Total knee arthroplasty Left 07/02/2013    Procedure: TOTAL KNEE ARTHROPLASTY- left;  Surgeon: Lorn Junes, MD;  Location: [facility];  Service: Orthopedics;  Laterality: Left;  . Colonoscopy with propofol N/A 05/23/2015    Procedure: COLONOSCOPY WITH PROPOFOL;  Surgeon: Carol Ada, MD;  Location: [facility];  Service: Endoscopy;  Laterality: N/A;  . Abdominal hysterectomy      complete  . Esophagogastroduodenoscopy (egd) with propofol N/A 10/10/2015    Procedure: ESOPHAGOGASTRODUODENOSCOPY (EGD) WITH PROPOFOL;  Surgeon: Carol Ada, MD;  Location: [facility];  Service: Endoscopy;  Laterality: N/A;    Allergies: Asa; Adhesive; and Tylenol  Medications: Prior to Admission medications   Medication Sig Start Date End Date Taking? Authorizing Provider  amLODipine (  NORVASC) 5 MG tablet Take 5 mg by mouth every morning.    Yes Historical Provider, MD  cycloSPORINE (RESTASIS) 0.05 % ophthalmic emulsion Place 1 drop into both eyes 2 (two) times daily.   Yes Historical Provider, MD  dexlansoprazole (DEXILANT) 60 MG capsule Take 60 mg by mouth every morning.    Yes Historical Provider, MD  DULoxetine (CYMBALTA) 60 MG capsule Take 60 mg by mouth daily.    Yes  Historical Provider, MD  esomeprazole (NEXIUM) 40 MG capsule Take 40 mg by mouth at bedtime.   Yes Historical Provider, MD  fluticasone (FLONASE) 50 MCG/ACT nasal spray Place 1 spray into both nostrils 2 (two) times daily as needed for allergies or rhinitis.    Yes Historical Provider, MD  furosemide (LASIX) 20 MG tablet Take 20 mg by mouth as needed. Prn 10/20/15  Yes Historical Provider, MD  gabapentin (NEURONTIN) 300 MG capsule Take 300 mg by mouth at bedtime.    Yes Historical Provider, MD  hydrochlorothiazide (MICROZIDE) 12.5 MG capsule Take 12.5 mg by mouth every morning.    Yes Historical Provider, MD  levothyroxine (SYNTHROID, LEVOTHROID) 100 MCG tablet Take 100 mcg by mouth at bedtime.    Yes Historical Provider, MD  lovastatin (MEVACOR) 40 MG tablet Take 40 mg by mouth at bedtime.   Yes Historical Provider, MD  metFORMIN (GLUCOPHAGE) 500 MG tablet Take 500 mg by mouth daily with breakfast.   Yes Historical Provider, MD  Multiple Vitamins-Minerals (CENTRUM SILVER PO) Take 1 tablet by mouth every morning.    Yes Historical Provider, MD  Olopatadine HCl (PATANASE) 0.6 % SOLN Place 1 each into the nose daily as needed (for allergies).   Yes Historical Provider, MD  ranitidine (ZANTAC) 150 MG tablet Take 150 mg by mouth at bedtime. Reported on 11/21/2015   Yes Historical Provider, MD  telmisartan (MICARDIS) 80 MG tablet Take 80 mg by mouth every morning.    Yes Historical Provider, MD  tetrahydrozoline (VISINE) 0.05 % ophthalmic solution Place 1 drop into both eyes 2 (two) times daily as needed (for allergies).    Yes Historical Provider, MD  Albiglutide (TANZEUM) 30 MG PEN Inject 30 mg into the skin every 7 (seven) days. Reported on 12/23/2015    Historical Provider, MD     Family History  Problem Relation Age of Onset  . Heart disease Mother   . Heart disease Father   . Heart disease Brother   . Heart attack Father   . Heart attack Brother   . Heart attack Sister   . Hypertension Mother    . Hypertension Father   . Hypertension Sister   . Stroke Mother   . Stroke Brother     Social History   Social History  . Marital Status: Widowed    Spouse Name: N/A  . Number of Children: N/A  . Years of Education: N/A   Occupational History  . retired    Social History Main Topics  . Smoking status: Former Smoker -- 0.50 packs/day for 35 years    Types: Cigarettes    Quit date: 12/26/1993  . Smokeless tobacco: Never Used  . Alcohol Use: Yes     Comment: very rarely wine, on holidays   . Drug Use: No  . Sexual Activity: Not on file   Other Topics Concern  . Not on file   Social History Narrative    ECOG Status: 1 - Symptomatic but completely ambulatory  Review of Systems: A 12 point ROS  discussed and pertinent positives are indicated in the HPI above.  All other systems are negative.  Review of Systems  Vital Signs: BP 141/71 mmHg  Pulse 62  Temp(Src) 98 F (36.7 C) (Oral)  Resp 14  Ht 5\' 2"  (1.575 m)  Wt 222 lb (100.699 kg)  BMI 40.59 kg/m2  SpO2 98%  Physical Exam  Constitutional: She appears well-developed and well-nourished.  Mild to moderate obese female in no distress.  Cardiovascular: Normal rate and regular rhythm.  Exam reveals no friction rub.   No murmur heard. Pulmonary/Chest: Effort normal and breath sounds normal. No respiratory distress.  Abdominal: Soft. Bowel sounds are normal. She exhibits no distension and no mass. There is no tenderness.  Skin: She is not diaphoretic.    Mallampati Score:   2  Imaging: Ct Chest W Contrast  11/25/2015  CLINICAL DATA:  Liver mass, evaluate for metastasis EXAM: CT CHEST WITH CONTRAST TECHNIQUE: Multidetector CT imaging of the chest was performed during intravenous contrast administration. CONTRAST:  67mL OMNIPAQUE IOHEXOL 300 MG/ML  SOLN COMPARISON:  Chest radiographs dated 10/07/2015. Partial comparison MRI abdomen dated 10/27/2015. FINDINGS: Mediastinum/Nodes: Heart is normal in size. No  pericardial effusion. 1.2 x 1.6 cm soft tissue lesion in the superior mediastinum (series 2/image 18), possibly reflecting a prevascular node. Visualized thyroid is unremarkable. Lungs/Pleura: Mild subpleural reticulation in the left lower lobe. No focal consolidation. No suspicious pulmonary nodules. No pleural effusion or pneumothorax. Upper abdomen: Visualized upper abdomen is notable for an ill-defined 3.6 cm lesion in the central right liver, corresponding to the MRI abnormality, suspicious for primary liver neoplasm (hepatocellular carcinoma) versus metastasis. Cholecystectomy clips. Musculoskeletal: Mild degenerative changes of the visualized thoracolumbar spine. IMPRESSION: Ill-defined 3.6 cm lesion in the central right liver, corresponding to the MRI abnormality, suspicious for hepatocellular carcinoma versus metastasis. No evidence of metastatic disease in the chest. Electronically Signed   By: Julian Hy M.D.   On: 11/25/2015 16:34   US Biopsy  12/01/2015  CLINICAL DATA:  Indeterminate central right liver mass within segment 8 EXAM: ULTRASOUND GUIDED CORE BIOPSY OF CENTRAL RIGHT LIVER MASS MEDICATIONS: 2.0 mg IV Versed; 100 mcg IV Fentanyl Total Moderate Sedation Time: 10 MINUTES, the patient's level of consciousness and physiological status was monitored by radiology nursing. PROCEDURE: The procedure, risks, benefits, and alternatives were explained to the patient. Questions regarding the procedure were encouraged and answered. The patient understands and consents to the procedure. Previous imaging reviewed. Preliminary ultrasound performed of the abdomen. The central right liver mass was localized in the right upper quadrant through an anterior lower intercostal space. The right upper quadrant was prepped with ChloraPrep in a sterile fashion, and a sterile drape was applied covering the operative field. A sterile gown and sterile gloves were used for the procedure. Local anesthesia was  provided with 1% Lidocaine. Under sterile conditions and local anesthesia, a 17 gauge 11.8 cm access needle was advanced from an anterior oblique approach into the central liver lesion. Needle position confirmed with ultrasound. 2 18 gauge core biopsies obtained. Needle tract embolized with Gel-Foam. Patient tolerated the procedure well. No immediate complication. COMPLICATIONS: None immediate FINDINGS: Imaging confirms needle placement in the central right liver mass for core biopsy IMPRESSION: Successful ultrasound central right liver mass 18 gauge core biopsy Electronically Signed   By: Jerilynn Mages.  Ketzia Guzek M.D.   On: 12/01/2015 11:58    Labs:  CBC:  Recent Labs  10/07/15 1438 11/21/15 1257 12/05/15 1219  WBC 5.0 5.5 4.7  HGB  13.2 13.5 12.8  HCT 41.2 41.4 39.9  PLT 129* 141* 105*    COAGS: No results for input(s): INR, APTT in the last 8760 hours.  BMP:  Recent Labs  10/07/15 1438 11/21/15 1257 12/05/15 1219  NA 143 142 146*  K 3.6 4.0 4.0  CL 107  --   --   CO2 30 24 28   GLUCOSE 109* 91 82  BUN 9 15.2 12.1  CALCIUM 9.2 9.6 9.4  CREATININE 0.64 0.8 0.8  GFRNONAA >60  --   --   GFRAA >60  --   --     LIVER FUNCTION TESTS:  Recent Labs  10/07/15 1438 11/21/15 1257 12/05/15 1219  BILITOT 0.7 0.71 0.60  AST 26 27 21   ALT 26 30 22   ALKPHOS 105 126 120  PROT 6.5 7.5 6.9  ALBUMIN 3.8 4.1 3.7    TUMOR MARKERS: No results for input(s): AFPTM, CEA, CA199, CHROMGRNA in the last 8760 hours.  Assessment and Plan:  4.5 cm moderately differentiated hepatocellular carcinoma within segment 8 of the liver. Interventional therapies were reviewed with the patient in detail including DEB TACE, and bland embolization, radio embolization, and microwave ablation. The procedures, risks, benefits and alternatives were reviewed. If she chooses not to have surgery, I would recommend directed right hepatic DEB TACE to treat the lesion. Based on the angiographic appearance and vascularity, this  could be repeated at one month. Surveillance imaging then could be performed 3 months posttreatment and if the lesion is demonstrating regression and closer to 3 cm, hopefully we could proceed with image guided microwave ablation.  Patient would like to review the treatment options with her family. I plan to also discuss the case with Dr. Barry Dienes in the next few days. Then, I will call her by telephone to rediscuss her case.  Thank you for this interesting consult.  I greatly enjoyed meeting Megan Horton and look forward to participating in their care.  A copy of this report was sent to the requesting provider on this date.  Electronically Signed: Greggory Keen 12/23/2015, 3:05 PM   I spent a total of  30 Minutes   in face to face in clinical consultation, greater than 50% of which was counseling/coordinating care for this patient with a right hepatic lobe moderately differentiated hepatocellular carcinoma.

## 2015-12-29 ENCOUNTER — Telehealth: Payer: Self-pay | Admitting: *Deleted

## 2015-12-29 NOTE — Telephone Encounter (Signed)
Oncology Nurse Navigator Documentation  Oncology Nurse Navigator Flowsheets 12/29/2015  Navigator Location [facility]  Navigator Encounter Type Telephone  Telephone Outgoing Call;Patient Update--she has decided she wants to pursue the DEB TACE liver treatment; planning to call Dr. Annamaria Boots  Abnormal Finding Date -  Patient Visit Type -  Treatment Phase -  Barriers/Navigation Needs Coordination of Care--IR and cardiology  Education -  Interventions Coordination of Care--sent message to Dr. Annamaria Boots that patient ready to pursue treatment.  Coordination of Care -  Support Groups/Services -  Acuity -  Time Spent with Patient -

## 2016-01-07 DIAGNOSIS — Z961 Presence of intraocular lens: Secondary | ICD-10-CM | POA: Diagnosis not present

## 2016-01-07 DIAGNOSIS — Z7984 Long term (current) use of oral hypoglycemic drugs: Secondary | ICD-10-CM | POA: Diagnosis not present

## 2016-01-07 DIAGNOSIS — E119 Type 2 diabetes mellitus without complications: Secondary | ICD-10-CM | POA: Diagnosis not present

## 2016-01-07 DIAGNOSIS — H04123 Dry eye syndrome of bilateral lacrimal glands: Secondary | ICD-10-CM | POA: Diagnosis not present

## 2016-01-08 ENCOUNTER — Other Ambulatory Visit: Payer: Self-pay | Admitting: Interventional Radiology

## 2016-01-08 ENCOUNTER — Telehealth (HOSPITAL_COMMUNITY): Payer: Self-pay | Admitting: Radiology

## 2016-01-08 DIAGNOSIS — C220 Liver cell carcinoma: Secondary | ICD-10-CM

## 2016-01-08 NOTE — Telephone Encounter (Signed)
Rec'd call from patient for appointment information for DEB TACE procedure in IR.  Patient instructed to arrive in [facility] at [place] on 01/19/16.  NPO after midnight, hold metformin.  Patient to take BP meds with sip of water.  Patient informed of overnight stay after the procedure.  Patient wanted to reschedule appt for a Friday, and she was informed that 3/13 is the only day in March that Dr. Annamaria Boots is available.  Patient will keep 3/13 appt.

## 2016-01-09 ENCOUNTER — Telehealth: Payer: Self-pay | Admitting: Hematology

## 2016-01-09 ENCOUNTER — Other Ambulatory Visit: Payer: Self-pay | Admitting: *Deleted

## 2016-01-09 NOTE — Telephone Encounter (Signed)
cld & spoke to pt and adv pt of appt 6/5 @ 1:15

## 2016-01-09 NOTE — Progress Notes (Signed)
In basket message from MD to schedule f/u with Dr. Burr Medico in 3 months. POF to scheduler

## 2016-01-15 MED ORDER — LC BEADS 100-300UM IN SALINE
150.0000 mg | Freq: Once | Status: AC
  Administered 2016-01-19: 75 mg via INTRA_ARTERIAL
  Filled 2016-01-15: qty 75

## 2016-01-16 ENCOUNTER — Other Ambulatory Visit: Payer: Self-pay | Admitting: Physician Assistant

## 2016-01-16 ENCOUNTER — Other Ambulatory Visit: Payer: Self-pay | Admitting: Radiology

## 2016-01-19 ENCOUNTER — Encounter (HOSPITAL_COMMUNITY): Payer: Self-pay

## 2016-01-19 ENCOUNTER — Observation Stay (HOSPITAL_COMMUNITY)
Admission: RE | Admit: 2016-01-19 | Discharge: 2016-01-20 | Disposition: A | Discharge status: Home / Self Care | Payer: Medicare Other | Source: Ambulatory Visit | Attending: Interventional Radiology | Admitting: Interventional Radiology

## 2016-01-19 ENCOUNTER — Ambulatory Visit (HOSPITAL_COMMUNITY)
Admission: RE | Admit: 2016-01-19 | Discharge: 2016-01-19 | Disposition: A | Discharge status: Home / Self Care | Payer: Medicare Other | Source: Ambulatory Visit | Attending: Interventional Radiology | Admitting: Interventional Radiology

## 2016-01-19 DIAGNOSIS — Z7984 Long term (current) use of oral hypoglycemic drugs: Secondary | ICD-10-CM | POA: Insufficient documentation

## 2016-01-19 DIAGNOSIS — Z79899 Other long term (current) drug therapy: Secondary | ICD-10-CM | POA: Insufficient documentation

## 2016-01-19 DIAGNOSIS — C220 Liver cell carcinoma: Principal | ICD-10-CM | POA: Insufficient documentation

## 2016-01-19 DIAGNOSIS — R112 Nausea with vomiting, unspecified: Secondary | ICD-10-CM | POA: Diagnosis not present

## 2016-01-19 LAB — PROTIME-INR
INR: 0.94 (ref 0.00–1.49)
PROTHROMBIN TIME: 12.8 s (ref 11.6–15.2)

## 2016-01-19 LAB — COMPREHENSIVE METABOLIC PANEL
ALT: 28 U/L (ref 14–54)
AST: 25 U/L (ref 15–41)
Albumin: 4 g/dL (ref 3.5–5.0)
Alkaline Phosphatase: 113 U/L (ref 38–126)
Anion gap: 10 (ref 5–15)
BILIRUBIN TOTAL: 0.4 mg/dL (ref 0.3–1.2)
BUN: 16 mg/dL (ref 6–20)
CHLORIDE: 107 mmol/L (ref 101–111)
CO2: 26 mmol/L (ref 22–32)
CREATININE: 0.68 mg/dL (ref 0.44–1.00)
Calcium: 8.8 mg/dL — ABNORMAL LOW (ref 8.9–10.3)
Glucose, Bld: 140 mg/dL — ABNORMAL HIGH (ref 65–99)
POTASSIUM: 3.7 mmol/L (ref 3.5–5.1)
Sodium: 143 mmol/L (ref 135–145)
TOTAL PROTEIN: 6.9 g/dL (ref 6.5–8.1)

## 2016-01-19 LAB — CBC
HCT: 37 % (ref 36.0–46.0)
HEMOGLOBIN: 12.2 g/dL (ref 12.0–15.0)
MCH: 29.3 pg (ref 26.0–34.0)
MCHC: 33 g/dL (ref 30.0–36.0)
MCV: 88.7 fL (ref 78.0–100.0)
PLATELETS: 109 10*3/uL — AB (ref 150–400)
RBC: 4.17 MIL/uL (ref 3.87–5.11)
RDW: 13.7 % (ref 11.5–15.5)
WBC: 5 10*3/uL (ref 4.0–10.5)

## 2016-01-19 LAB — TYPE AND SCREEN
ABO/RH(D): O POS
ANTIBODY SCREEN: NEGATIVE

## 2016-01-19 LAB — APTT: aPTT: 30 seconds (ref 24–37)

## 2016-01-19 LAB — GLUCOSE, CAPILLARY: GLUCOSE-CAPILLARY: 128 mg/dL — AB (ref 65–99)

## 2016-01-19 LAB — ABO/RH: ABO/RH(D): O POS

## 2016-01-19 MED ORDER — FENTANYL CITRATE (PF) 100 MCG/2ML IJ SOLN
INTRAMUSCULAR | Status: AC
  Filled 2016-01-19: qty 4

## 2016-01-19 MED ORDER — LACTATED RINGERS IV SOLN: INTRAVENOUS | Status: DC

## 2016-01-19 MED ORDER — OXYCODONE HCL 5 MG PO TABS: 5.0000 mg | ORAL_TABLET | Freq: Four times a day (QID) | ORAL | Status: DC | PRN

## 2016-01-19 MED ORDER — DULOXETINE HCL 60 MG PO CPEP
60.0000 mg | ORAL_CAPSULE | Freq: Every day | ORAL | Status: DC
  Administered 2016-01-19 – 2016-01-20 (×2): 60 mg via ORAL
  Filled 2016-01-19 (×2): qty 1

## 2016-01-19 MED ORDER — PROMETHAZINE HCL 25 MG/ML IJ SOLN
12.5000 mg | Freq: Once | INTRAMUSCULAR | Status: AC
  Administered 2016-01-19: 12.5 mg via INTRAVENOUS
  Filled 2016-01-19: qty 1

## 2016-01-19 MED ORDER — ONDANSETRON HCL 4 MG/2ML IJ SOLN
4.0000 mg | Freq: Four times a day (QID) | INTRAMUSCULAR | Status: DC | PRN
  Administered 2016-01-19 – 2016-01-20 (×3): 4 mg via INTRAVENOUS
  Filled 2016-01-19 (×3): qty 2

## 2016-01-19 MED ORDER — PIPERACILLIN-TAZOBACTAM 3.375 G IVPB 30 MIN
3.3750 g | Freq: Once | INTRAVENOUS | Status: AC
  Administered 2016-01-19: 3.375 g via INTRAVENOUS
  Filled 2016-01-19: qty 50

## 2016-01-19 MED ORDER — IRBESARTAN 75 MG PO TABS
75.0000 mg | ORAL_TABLET | Freq: Every day | ORAL | Status: DC
  Administered 2016-01-19 – 2016-01-20 (×2): 75 mg via ORAL
  Filled 2016-01-19 (×2): qty 1

## 2016-01-19 MED ORDER — IOHEXOL 300 MG/ML  SOLN
100.0000 mL | Freq: Once | INTRAMUSCULAR | Status: AC | PRN
  Administered 2016-01-19: 1 mL via INTRAVENOUS

## 2016-01-19 MED ORDER — PRAVASTATIN SODIUM 10 MG PO TABS
10.0000 mg | ORAL_TABLET | Freq: Every day | ORAL | Status: DC
  Administered 2016-01-19: 10 mg via ORAL
  Filled 2016-01-19 (×2): qty 1

## 2016-01-19 MED ORDER — PIPERACILLIN-TAZOBACTAM 3.375 G IVPB
3.3750 g | Freq: Once | INTRAVENOUS | Status: DC
  Filled 2016-01-19: qty 50

## 2016-01-19 MED ORDER — CYCLOSPORINE 0.05 % OP EMUL
1.0000 [drp] | Freq: Two times a day (BID) | OPHTHALMIC | Status: DC
  Administered 2016-01-19 – 2016-01-20 (×2): 1 [drp] via OPHTHALMIC
  Filled 2016-01-19 (×3): qty 1

## 2016-01-19 MED ORDER — PANTOPRAZOLE SODIUM 40 MG PO TBEC
40.0000 mg | DELAYED_RELEASE_TABLET | Freq: Every day | ORAL | Status: DC
  Administered 2016-01-19 – 2016-01-20 (×2): 40 mg via ORAL
  Filled 2016-01-19 (×2): qty 1

## 2016-01-19 MED ORDER — GABAPENTIN 300 MG PO CAPS
300.0000 mg | ORAL_CAPSULE | Freq: Every day | ORAL | Status: DC
  Administered 2016-01-19: 300 mg via ORAL
  Filled 2016-01-19 (×2): qty 1

## 2016-01-19 MED ORDER — NAPHAZOLINE-GLYCERIN 0.012-0.2 % OP SOLN
2.0000 [drp] | Freq: Four times a day (QID) | OPHTHALMIC | Status: DC | PRN
  Filled 2016-01-19: qty 15

## 2016-01-19 MED ORDER — OLOPATADINE HCL 0.6 % NA SOLN: 1.0000 | Freq: Every day | NASAL | Status: DC | PRN

## 2016-01-19 MED ORDER — ALUM & MAG HYDROXIDE-SIMETH 200-200-20 MG/5ML PO SUSP
15.0000 mL | ORAL | Status: DC | PRN
  Administered 2016-01-19: 15 mL via ORAL
  Filled 2016-01-19: qty 30

## 2016-01-19 MED ORDER — IOHEXOL 300 MG/ML  SOLN
100.0000 mL | Freq: Once | INTRAMUSCULAR | Status: AC | PRN
  Administered 2016-01-19: 80 mL via INTRAVENOUS

## 2016-01-19 MED ORDER — HYDROCHLOROTHIAZIDE 12.5 MG PO CAPS
12.5000 mg | ORAL_CAPSULE | Freq: Every morning | ORAL | Status: DC
Start: 2016-01-20 — End: 2016-01-20
  Administered 2016-01-20: 12.5 mg via ORAL
  Filled 2016-01-19: qty 1

## 2016-01-19 MED ORDER — IOHEXOL 300 MG/ML  SOLN
100.0000 mL | Freq: Once | INTRAMUSCULAR | Status: AC | PRN
  Administered 2016-01-19: 10 mL via INTRAVENOUS

## 2016-01-19 MED ORDER — MIDAZOLAM HCL 2 MG/2ML IJ SOLN
INTRAMUSCULAR | Status: AC | PRN
  Administered 2016-01-19: 0.5 mg via INTRAVENOUS
  Administered 2016-01-19: 1 mg via INTRAVENOUS

## 2016-01-19 MED ORDER — NITROGLYCERIN 1 MG/10 ML FOR IR/CATH LAB
INTRA_ARTERIAL | Status: AC | PRN
  Administered 2016-01-19: 100 ug via INTRA_ARTERIAL

## 2016-01-19 MED ORDER — MIDAZOLAM HCL 2 MG/2ML IJ SOLN
INTRAMUSCULAR | Status: AC
  Filled 2016-01-19: qty 6

## 2016-01-19 MED ORDER — METFORMIN HCL 500 MG PO TABS
500.0000 mg | ORAL_TABLET | Freq: Every day | ORAL | Status: DC
  Filled 2016-01-19: qty 1

## 2016-01-19 MED ORDER — FLUTICASONE PROPIONATE 50 MCG/ACT NA SUSP
1.0000 | Freq: Two times a day (BID) | NASAL | Status: DC | PRN
  Filled 2016-01-19: qty 16

## 2016-01-19 MED ORDER — IOHEXOL 300 MG/ML  SOLN
100.0000 mL | Freq: Once | INTRAMUSCULAR | Status: AC | PRN
  Administered 2016-01-19: 32 mL via INTRAVENOUS

## 2016-01-19 MED ORDER — DOCUSATE SODIUM 100 MG PO CAPS: 100.0000 mg | ORAL_CAPSULE | Freq: Every day | ORAL | Status: DC | PRN

## 2016-01-19 MED ORDER — FENTANYL CITRATE (PF) 100 MCG/2ML IJ SOLN
INTRAMUSCULAR | Status: AC | PRN
  Administered 2016-01-19 (×2): 25 ug via INTRAVENOUS

## 2016-01-19 MED ORDER — METFORMIN HCL 500 MG PO TABS: 500.0000 mg | ORAL_TABLET | Freq: Every day | ORAL | Status: DC

## 2016-01-19 MED ORDER — AMLODIPINE BESYLATE 5 MG PO TABS
5.0000 mg | ORAL_TABLET | Freq: Every morning | ORAL | Status: DC
  Administered 2016-01-20: 5 mg via ORAL
  Filled 2016-01-19: qty 1

## 2016-01-19 MED ORDER — ADULT MULTIVITAMIN W/MINERALS CH
ORAL_TABLET | Freq: Every morning | ORAL | Status: DC
Start: 2016-01-20 — End: 2016-01-20
  Administered 2016-01-20: 1 via ORAL
  Filled 2016-01-19: qty 1

## 2016-01-19 MED ORDER — LEVOTHYROXINE SODIUM 100 MCG PO TABS
100.0000 ug | ORAL_TABLET | Freq: Every day | ORAL | Status: DC
  Administered 2016-01-19: 100 ug via ORAL
  Filled 2016-01-19 (×2): qty 1

## 2016-01-19 MED ORDER — LIDOCAINE HCL 1 % IJ SOLN
INTRAMUSCULAR | Status: AC
  Filled 2016-01-19: qty 20

## 2016-01-19 MED ORDER — SODIUM CHLORIDE 0.9 % IV SOLN
INTRAVENOUS | Status: DC
  Administered 2016-01-19 (×2): via INTRAVENOUS

## 2016-01-19 NOTE — Sedation Documentation (Signed)
Patient denies pain and is resting comfortably.  

## 2016-01-19 NOTE — Procedures (Signed)
Successful Rt hepatic angio and Rt DEB TACE for [facility] Delivered 75mg  Doxi with LC beads to peripheral Rt hepatic stasis No comp Stable Full report in PACS

## 2016-01-19 NOTE — Progress Notes (Signed)
PHARMACIST - PHYSICIAN COMMUNICATION DR:  Annamaria Boots CONCERNING:  METFORMIN SAFE ADMINISTRATION POLICY  RECOMMENDATION: Metformin has been held x 48 hrs per P&T policy  Current Safety recommendations include avoiding metformin for a minimum of 48 hours after the patient's exposure to intravenous contrast media for the following conditions:  . eGFR < 60 ml/min  . Liver disease, alcoholism, heart failure, intra-arterial administration of contrast    Reuel Boom, PharmD, BCPS Pager: [phone] 01/19/2016, 4:40 PM

## 2016-01-19 NOTE — Sedation Documentation (Signed)
45fr sheath removed from right fem artery by Dr. Annamaria Boots. Hemostasis achieved using manual pressure held by Lambert Mody, RTR for 10 minutes. RDP +2, RPT +1, groin level 0.

## 2016-01-19 NOTE — H&P (Signed)
Referring Physician(s): Byerly,Faera/Feng, Krista Blue  Supervising Physician: Daryll Brod  Chief Complaint:  Hepatocellular carcinoma  Subjective: Patient familiar to IR service from recent liver lesion biopsy on 12/01/15 and subsequent consultation to discuss treatment options for a right hepatic lobe [place] on 12/23/15. Patient was seen by Dr. Annamaria Boots and deemed an appropriate candidate for hepatic DEB-TACE. She presents today for the above procedure. She currently denies fevers, headache, chest pain, dyspnea, abdominal/back pain, nausea, vomiting or abnormal bleeding. She does have occasional cough.   Allergies: Asa; Adhesive; and Tylenol  Medications: Prior to Admission medications   Medication Sig Start Date End Date Taking? Authorizing Provider  amLODipine (NORVASC) 5 MG tablet Take 5 mg by mouth every morning.    Yes Historical Provider, MD  cycloSPORINE (RESTASIS) 0.05 % ophthalmic emulsion Place 1 drop into both eyes 2 (two) times daily.   Yes Historical Provider, MD  dexlansoprazole (DEXILANT) 60 MG capsule Take 60 mg by mouth every morning.    Yes Historical Provider, MD  DULoxetine (CYMBALTA) 60 MG capsule Take 60 mg by mouth daily.    Yes Historical Provider, MD  esomeprazole (NEXIUM) 40 MG capsule Take 40 mg by mouth at bedtime.   Yes Historical Provider, MD  fluticasone (FLONASE) 50 MCG/ACT nasal spray Place 1 spray into both nostrils 2 (two) times daily as needed for allergies or rhinitis.    Yes Historical Provider, MD  furosemide (LASIX) 20 MG tablet Take 20 mg by mouth as needed. Prn 10/20/15  Yes Historical Provider, MD  gabapentin (NEURONTIN) 300 MG capsule Take 300 mg by mouth at bedtime.    Yes Historical Provider, MD  hydrochlorothiazide (MICROZIDE) 12.5 MG capsule Take 12.5 mg by mouth every morning.    Yes Historical Provider, MD  levothyroxine (SYNTHROID, LEVOTHROID) 100 MCG tablet Take 100 mcg by mouth at bedtime.    Yes Historical Provider, MD  lovastatin  (MEVACOR) 40 MG tablet Take 40 mg by mouth at bedtime.   Yes Historical Provider, MD  metFORMIN (GLUCOPHAGE) 500 MG tablet Take 500 mg by mouth daily with breakfast.   Yes Historical Provider, MD  Multiple Vitamins-Minerals (CENTRUM SILVER PO) Take 1 tablet by mouth every morning.    Yes Historical Provider, MD  Olopatadine HCl (PATANASE) 0.6 % SOLN Place 1 each into the nose daily as needed (for allergies).   Yes Historical Provider, MD  ranitidine (ZANTAC) 150 MG tablet Take 150 mg by mouth at bedtime. Reported on 11/21/2015   Yes Historical Provider, MD  telmisartan (MICARDIS) 80 MG tablet Take 80 mg by mouth every morning.    Yes Historical Provider, MD  tetrahydrozoline (VISINE) 0.05 % ophthalmic solution Place 1 drop into both eyes 2 (two) times daily as needed (for allergies).    Yes Historical Provider, MD  Albiglutide (TANZEUM) 30 MG PEN Inject 30 mg into the skin every 7 (seven) days. Reported on 12/23/2015    Historical Provider, MD     Vital Signs:Blood pressure 151/71, heart rate 72, temperature 98.7, respirations 18, oxygen saturation 99% room air   Physical Exam patient awake, alert. Chest with slightly diminished breath sounds at bases. Heart with regular rate and rhythm. Abdomen obese, soft, positive bowel sounds, nontender. Lower extremities with trace to 1+ edema.  Imaging: No results found.  Labs:  CBC:  Recent Labs  10/07/15 1438 11/21/15 1257 12/05/15 1219  WBC 5.0 5.5 4.7  HGB 13.2 13.5 12.8  HCT 41.2 41.4 39.9  PLT 129* 141* 105*  COAGS: No results for input(s): INR, APTT in the last 8760 hours.  BMP:  Recent Labs  10/07/15 1438 11/21/15 1257 12/05/15 1219  NA 143 142 146*  K 3.6 4.0 4.0  CL 107  --   --   CO2 30 24 28   GLUCOSE 109* 91 82  BUN 9 15.2 12.1  CALCIUM 9.2 9.6 9.4  CREATININE 0.64 0.8 0.8  GFRNONAA >60  --   --   GFRAA >60  --   --     LIVER FUNCTION TESTS:  Recent Labs  10/07/15 1438 11/21/15 1257 12/05/15 1219    BILITOT 0.7 0.71 0.60  AST 26 27 21   ALT 26 30 22   ALKPHOS 105 126 120  PROT 6.5 7.5 6.9  ALBUMIN 3.8 4.1 3.7    Assessment and Plan: Patient was recently diagnosed 4.5 cm moderately differentiated hepatocellular carcinoma within segment 8 of the liver. Seen recently in consultation by Dr. Annamaria Boots and deemed an appropriate candidate for hepatic DEB-TACE. She presents today for the above procedure. Details/risks of procedure, including but not limited to, internal bleeding, infection, contrast nephropathy, nontarget embolization discussed with patient and family with their understanding and consent. Following procedure the patient will be admitted for overnight observation.   Electronically Signed: D. Rowe Robert 01/19/2016, 9:21 AM   I spent a total of 30 minutes at the the patient's bedside AND on the patient's hospital floor or unit, greater than 50% of which was counseling/coordinating care for hepatic DEB-TACE

## 2016-01-20 DIAGNOSIS — C220 Liver cell carcinoma: Secondary | ICD-10-CM | POA: Diagnosis not present

## 2016-01-20 LAB — CBC WITH DIFFERENTIAL/PLATELET
BASOS ABS: 0 10*3/uL (ref 0.0–0.1)
Basophils Relative: 0 %
EOS ABS: 0 10*3/uL (ref 0.0–0.7)
EOS PCT: 0 %
HCT: 41.1 % (ref 36.0–46.0)
HEMOGLOBIN: 13 g/dL (ref 12.0–15.0)
LYMPHS PCT: 10 %
Lymphs Abs: 0.8 10*3/uL (ref 0.7–4.0)
MCH: 29.2 pg (ref 26.0–34.0)
MCHC: 31.6 g/dL (ref 30.0–36.0)
MCV: 92.4 fL (ref 78.0–100.0)
Monocytes Absolute: 0.6 10*3/uL (ref 0.1–1.0)
Monocytes Relative: 7 %
NEUTROS PCT: 83 %
Neutro Abs: 6.7 10*3/uL (ref 1.7–7.7)
PLATELETS: 123 10*3/uL — AB (ref 150–400)
RBC: 4.45 MIL/uL (ref 3.87–5.11)
RDW: 13.4 % (ref 11.5–15.5)
WBC: 8.1 10*3/uL (ref 4.0–10.5)

## 2016-01-20 LAB — COMPREHENSIVE METABOLIC PANEL
ALT: 30 U/L (ref 14–54)
AST: 27 U/L (ref 15–41)
Albumin: 3.8 g/dL (ref 3.5–5.0)
Alkaline Phosphatase: 101 U/L (ref 38–126)
Anion gap: 10 (ref 5–15)
BUN: 12 mg/dL (ref 6–20)
CHLORIDE: 106 mmol/L (ref 101–111)
CO2: 25 mmol/L (ref 22–32)
CREATININE: 0.57 mg/dL (ref 0.44–1.00)
Calcium: 8.8 mg/dL — ABNORMAL LOW (ref 8.9–10.3)
GFR calc Af Amer: >60 mL/min (ref >60)
GFR calc non Af Amer: >60 mL/min (ref >60)
Glucose, Bld: 163 mg/dL — ABNORMAL HIGH (ref 65–99)
Potassium: 3.7 mmol/L (ref 3.5–5.1)
SODIUM: 141 mmol/L (ref 135–145)
Total Bilirubin: 0.6 mg/dL (ref 0.3–1.2)
Total Protein: 6.6 g/dL (ref 6.5–8.1)

## 2016-01-20 MED ORDER — OXYCODONE HCL 5 MG PO TABS: 5.0000 mg | ORAL_TABLET | Freq: Four times a day (QID) | ORAL | Status: DC | PRN

## 2016-01-20 MED ORDER — CIPROFLOXACIN HCL 500 MG PO TABS: 500.0000 mg | ORAL_TABLET | Freq: Two times a day (BID) | ORAL | Status: DC

## 2016-01-20 MED ORDER — DOCUSATE SODIUM 100 MG PO CAPS: 100.0000 mg | ORAL_CAPSULE | Freq: Every day | ORAL | Status: AC | PRN

## 2016-01-20 MED ORDER — ONDANSETRON HCL 8 MG PO TABS: 8.0000 mg | ORAL_TABLET | Freq: Three times a day (TID) | ORAL | Status: DC | PRN

## 2016-01-20 NOTE — Care Management Obs Status (Signed)
[place] NOTIFICATION   Patient Details  Name: Megan Horton MRN: [id number] Date of Birth: [birth year]   Medicare Observation Status Notification Given:  Yes    Lynnell Catalan, RN 01/20/2016, 11:23 AM

## 2016-01-20 NOTE — Progress Notes (Signed)
Patient given discharge instructions, and verbalized an understanding of all discharge instructions.  Patient agrees with discharge plan, and is being discharged in stable medical condition.  Patient given transportation via wheelchair. 

## 2016-01-20 NOTE — Discharge Instructions (Signed)
DO NOT TAKE YOUR METFORMIN UNTIL 01-22-16 DUE TO CONTRAST RECEIVED DURING YOUR PROCEDURE!!  Chemoembolization, Care After Refer to this sheet in the next few weeks. These instructions provide you with information on caring for yourself after your procedure. Your health care provider may also give you more specific instructions. Your treatment has been planned according to current medical practices, but problems sometimes occur. Call your health care provider if you have any problems or questions after your procedure. WHAT TO EXPECT AFTER THE PROCEDURE  After your procedure, it is typical to have the following:  You might have a slight fever for 1-2 weeks after the procedure. If it gets worse, let your health care provider know.  You might feel tired and not hungry. This is normal. These feelings should go away in about 1 week. HOME CARE INSTRUCTIONS  Take any medicine your health care provider prescribed for pain, nausea, or fever. Follow the directions carefully.  Ask your health care provider whether you can take over-the-counter medicines for pain or fever. Do not take aspirin or anti-inflammatory medicines such as ibuprofen or naproxen unless your health care provider says that you should. These medicines can increase the chances of bleeding.  If you were given a small breathing device (incentive spirometer), be sure to use it. It helps keep your lungs clear while you are recovering. You will not need this after your activity level is back to normal.  Do not get the puncture site wet for the first few days after surgery or until your health care provider says it is okay.  You should be able to resume your normal routine in about 1 week.  During the first month after your procedure, you will probably need to go back to your health care provider for some simple tests. Scans and blood tests will help determine whether the procedure worked. SEEK MEDICAL CARE IF:  Blood or fluid leaks from the  wound, or the wound becomes red or swollen.  You become nauseous or throw up for more than 2 days after surgery.  Your pain or fever becomes worse than it was when you left the hospital.  You cannot drink clear liquids such as water or diluted juice or tea 24 hours after your procedure.  You develop a rash. SEEK IMMEDIATE MEDICAL CARE IF:  You have a fever that gets worse or does not go away after 1 week.  You develop pain, swelling, or discoloration in your legs.  Your legs become pale, cold, or blue.  You develop shortness of breath, feel faint, or pass out.  You have chest pain.  You have weakness or difficulty moving your arms or legs.  You have changes in your speech or vision.   This information is not intended to replace advice given to you by your health care provider. Make sure you discuss any questions you have with your health care provider.   Document Released: 06/23/2011 Document Revised: 08/15/2013 Document Reviewed: 07/02/2013 Elsevier Interactive Patient Education [employer].

## 2016-01-20 NOTE — Discharge Summary (Signed)
Patient ID: Megan Horton MRN: [id number] DOB/AGE: [birth year] 75 y.o.  Admit date: 01/19/2016 Discharge date: 01/20/2016  Supervising Physician: Daryll Brod  Admission Diagnoses:  1. Hepatocellular carcinoma  Discharge Diagnoses:  1. Hepatocellular carcinoma  Discharged Condition: good  Hospital Course: The patient was admitted and underwent a DEB-TACE procedure.  She tolerated this well.  She did have some nausea and emesis the night of the procedure.  This had improved by the following morning.  She did have some residual discomfort, but this was controlled with her pain medications.  She was stable on POD 1 for dc home.  Consults: None  Treatments: IV hydration, antibiotics: Cipro and analgesia: oxycodone  Discharge Exam: Blood pressure 132/53, pulse 57, temperature 97.4 F (36.3 C), temperature source Oral, resp. rate 18, SpO2 93 %. General appearance: alert and cooperative Resp: clear to auscultation bilaterally Cardio: regular rate and rhythm GI: soft, minimally tender in epigastrium and RUQ, but otherwise NT, obese, but ND, +BS.  right groin site is clean and intact, no evidence of bleeding or ecchymosis.  Disposition: 01-Home or Self Care     Medication List    TAKE these medications        amLODipine 5 MG tablet  Commonly known as:  NORVASC  Take 5 mg by mouth every morning.     CENTRUM SILVER PO  Take 1 tablet by mouth every morning.     ciprofloxacin 500 MG tablet  Commonly known as:  CIPRO  Take 1 tablet (500 mg total) by mouth 2 (two) times daily.     cycloSPORINE 0.05 % ophthalmic emulsion  Commonly known as:  RESTASIS  Place 1 drop into both eyes 2 (two) times daily.     dexlansoprazole 60 MG capsule  Commonly known as:  DEXILANT  Take 60 mg by mouth every morning.     docusate sodium 100 MG capsule  Commonly known as:  COLACE  Take 1 capsule (100 mg total) by mouth daily as needed for mild constipation.     DULoxetine 60 MG capsule    Commonly known as:  CYMBALTA  Take 60 mg by mouth daily.     esomeprazole 40 MG capsule  Commonly known as:  NEXIUM  Take 40 mg by mouth at bedtime.     FLONASE 50 MCG/ACT nasal spray  Generic drug:  fluticasone  Place 1 spray into both nostrils 2 (two) times daily as needed for allergies or rhinitis.     furosemide 20 MG tablet  Commonly known as:  LASIX  Take 20 mg by mouth as needed. Prn     gabapentin 300 MG capsule  Commonly known as:  NEURONTIN  Take 300 mg by mouth at bedtime.     hydrochlorothiazide 12.5 MG capsule  Commonly known as:  MICROZIDE  Take 12.5 mg by mouth every morning.     levothyroxine 112 MCG tablet  Commonly known as:  SYNTHROID, LEVOTHROID  Take 112 mcg by mouth daily.     lovastatin 40 MG tablet  Commonly known as:  MEVACOR  Take 40 mg by mouth at bedtime.     metFORMIN 500 MG tablet  Commonly known as:  GLUCOPHAGE  Take 500 mg by mouth daily with breakfast.     ondansetron 8 MG tablet  Commonly known as:  ZOFRAN  Take 1 tablet (8 mg total) by mouth every 8 (eight) hours as needed for nausea or vomiting.     oxyCODONE 5 MG immediate release tablet  Commonly  known as:  Oxy IR/ROXICODONE  Take 1 tablet (5 mg total) by mouth every 6 (six) hours as needed for moderate pain.     PATANASE 0.6 % Soln  Generic drug:  Olopatadine HCl  Place 1 each into the nose daily as needed (for allergies).     ranitidine 150 MG tablet  Commonly known as:  ZANTAC  Take 150 mg by mouth at bedtime. Reported on 11/21/2015     telmisartan 80 MG tablet  Commonly known as:  MICARDIS  Take 80 mg by mouth every morning.     VISINE 0.05 % ophthalmic solution  Generic drug:  tetrahydrozoline  Place 1 drop into both eyes 2 (two) times daily as needed (for allergies).           Follow-up Information    Follow up with Greggory Keen, MD In 2 weeks.   Specialty:  Interventional Radiology   Why:  our office will call you   Contact information:   [address] [place] [address] [phone]        Electronically Signed: Henreitta Cea 01/20/2016, 9:19 AM   I have spent Less Than 30 Minutes discharging Megan Horton.

## 2016-01-22 DIAGNOSIS — H1812 Bullous keratopathy, left eye: Secondary | ICD-10-CM | POA: Diagnosis not present

## 2016-02-05 DIAGNOSIS — C220 Liver cell carcinoma: Secondary | ICD-10-CM | POA: Diagnosis not present

## 2016-02-05 DIAGNOSIS — K5900 Constipation, unspecified: Secondary | ICD-10-CM | POA: Diagnosis not present

## 2016-02-10 ENCOUNTER — Other Ambulatory Visit (HOSPITAL_COMMUNITY): Payer: Self-pay | Admitting: Interventional Radiology

## 2016-02-10 ENCOUNTER — Ambulatory Visit
Admission: RE | Admit: 2016-02-10 | Discharge: 2016-02-10 | Disposition: A | Discharge status: Home / Self Care | Payer: Medicare Other | Source: Ambulatory Visit | Attending: General Surgery | Admitting: General Surgery

## 2016-02-10 DIAGNOSIS — C220 Liver cell carcinoma: Secondary | ICD-10-CM

## 2016-02-10 NOTE — Progress Notes (Signed)
Patient ID: Megan Horton, female   DOB: 1941-03-06, 75 y.o.   MRN: [id number]       Chief Complaint: Hepatocellular carcinoma, status post DEB TACE. Referring Physician(s): Haynes Hoehn  History of Present Illness: Megan Horton is a 75 y.o. female with biopsy-proven right hepatocellular carcinoma, well-differentiated. Initial lesion size 4.5 cm. Lesion is within segment 8. Minor cirrhosis of the liver as well. She is now one month status post successful DEB TACE to the right hepatic mass. She returns for outpatient follow-up. She reports mild post embolization syndrome with fatigue and myalgias however this has resolved after approximately 10 days. No current abdominal pain. No signs of jaundice. Overall she is back to her baseline. No fevers. Functional status remains very good. Past Medical History  Diagnosis Date  . Hypertension   . Peripheral vascular disease ([place])   . Seasonal allergies   . Diabetes mellitus ([place])   . Hypothyroidism   . GERD (gastroesophageal reflux disease)   . Arthritis   . Sleep apnea     CPAP, sleep study 3-4 years ago in [place], [place] Dr. Elbert Ewings   . Wears glasses   . Adhesive capsulitis of right shoulder   . Partial tear of rotator cuff(726.13) left  . Complication of anesthesia 5/13    did have some disorientation post cerv fusion due to low sats  . Left knee DJD   . Seasonal allergies   . Urgency of urination   . Hyperlipemia   . Peripheral neuropathy ([facility])     fingers     Past Surgical History  Procedure Laterality Date  . Cholecystectomy  2009  . Hemorrhoid surgery  2009  . Knee arthroscopy      Left x2  . Carpal tunnel release      bilat  . Cervical fusion    . Tonsillectomy    . Varicose vein surgery      Right  . Heel spur excision      bilat  . Nose surgery    . Anterior cervical decomp/discectomy fusion  03/10/2012    Procedure: ANTERIOR CERVICAL DECOMPRESSION/DISCECTOMY FUSION 1 LEVEL;  Surgeon: Eustace Moore, MD;   Location: [facility];  Service: Neurosurgery;  Laterality: N/A;  Cervical five-six anterior cervical decompression fusion with plating  . Eye surgery  2012    bilat cataract  . Shoulder arthroscopy  06/27/2012    Procedure: ARTHROSCOPY SHOULDER;  Surgeon: Lorn Junes, MD;  Location: [facility];  Service: Orthopedics;  Laterality: Right;  right shoulder arthroscopy debridement extensive, distal clavulectomy, with resect adhesions with maniplulation  . Colonoscopy w/ polypectomy    . Total knee arthroplasty Left 07/02/2013    Procedure: TOTAL KNEE ARTHROPLASTY- left;  Surgeon: Lorn Junes, MD;  Location: [facility];  Service: Orthopedics;  Laterality: Left;  . Colonoscopy with propofol N/A 05/23/2015    Procedure: COLONOSCOPY WITH PROPOFOL;  Surgeon: Carol Ada, MD;  Location: [facility];  Service: Endoscopy;  Laterality: N/A;  . Abdominal hysterectomy      complete  . Esophagogastroduodenoscopy (egd) with propofol N/A 10/10/2015    Procedure: ESOPHAGOGASTRODUODENOSCOPY (EGD) WITH PROPOFOL;  Surgeon: Carol Ada, MD;  Location: [facility];  Service: Endoscopy;  Laterality: N/A;    Allergies: Asa; Adhesive; and Tylenol  Medications: Prior to Admission medications   Medication Sig Start Date End Date Taking? Authorizing Provider  amLODipine (NORVASC) 5 MG tablet Take 5 mg by mouth every morning.    Yes Historical Provider, MD  cycloSPORINE (  RESTASIS) 0.05 % ophthalmic emulsion Place 1 drop into both eyes 2 (two) times daily.   Yes Historical Provider, MD  dexlansoprazole (DEXILANT) 60 MG capsule Take 60 mg by mouth every morning.    Yes Historical Provider, MD  docusate sodium (COLACE) 100 MG capsule Take 1 capsule (100 mg total) by mouth daily as needed for mild constipation. 01/20/16  Yes Saverio Danker, PA-C  DULoxetine (CYMBALTA) 60 MG capsule Take 60 mg by mouth daily.    Yes Historical Provider, MD  esomeprazole (NEXIUM) 40 MG capsule Take 40 mg by mouth at bedtime.    Yes Historical Provider, MD  fluticasone (FLONASE) 50 MCG/ACT nasal spray Place 1 spray into both nostrils 2 (two) times daily as needed for allergies or rhinitis.    Yes Historical Provider, MD  gabapentin (NEURONTIN) 300 MG capsule Take 300 mg by mouth at bedtime.    Yes Historical Provider, MD  hydrochlorothiazide (MICROZIDE) 12.5 MG capsule Take 12.5 mg by mouth every morning.    Yes Historical Provider, MD  levothyroxine (SYNTHROID, LEVOTHROID) 112 MCG tablet Take 112 mcg by mouth daily. 12/22/15  Yes Historical Provider, MD  lovastatin (MEVACOR) 40 MG tablet Take 40 mg by mouth at bedtime.   Yes Historical Provider, MD  metFORMIN (GLUCOPHAGE) 500 MG tablet Take 500 mg by mouth daily with breakfast.   Yes Historical Provider, MD  Multiple Vitamins-Minerals (CENTRUM SILVER PO) Take 1 tablet by mouth every morning.    Yes Historical Provider, MD  Olopatadine HCl (PATANASE) 0.6 % SOLN Place 1 each into the nose daily as needed (for allergies).   Yes Historical Provider, MD  ranitidine (ZANTAC) 150 MG tablet Take 150 mg by mouth at bedtime. Reported on 11/21/2015   Yes Historical Provider, MD  telmisartan (MICARDIS) 80 MG tablet Take 80 mg by mouth every morning.    Yes Historical Provider, MD  tetrahydrozoline (VISINE) 0.05 % ophthalmic solution Place 1 drop into both eyes 2 (two) times daily as needed (for allergies).    Yes Historical Provider, MD  ciprofloxacin (CIPRO) 500 MG tablet Take 1 tablet (500 mg total) by mouth 2 (two) times daily. Patient not taking: Reported on 02/10/2016 01/20/16   Saverio Danker, PA-C  furosemide (LASIX) 20 MG tablet Take 20 mg by mouth as needed. Reported on 02/10/2016 10/20/15   Historical Provider, MD  ondansetron (ZOFRAN) 8 MG tablet Take 1 tablet (8 mg total) by mouth every 8 (eight) hours as needed for nausea or vomiting. Patient not taking: Reported on 02/10/2016 01/20/16   Saverio Danker, PA-C  oxyCODONE (OXY IR/ROXICODONE) 5 MG immediate release tablet Take 1 tablet  (5 mg total) by mouth every 6 (six) hours as needed for moderate pain. Patient not taking: Reported on 02/10/2016 01/20/16   Saverio Danker, PA-C     Family History  Problem Relation Age of Onset  . Heart disease Mother   . Heart disease Father   . Heart disease Brother   . Heart attack Father   . Heart attack Brother   . Heart attack Sister   . Hypertension Mother   . Hypertension Father   . Hypertension Sister   . Stroke Mother   . Stroke Brother     Social History   Social History  . Marital Status: Widowed    Spouse Name: N/A  . Number of Children: N/A  . Years of Education: N/A   Occupational History  . retired    Social History Main Topics  . Smoking status: Former  Smoker -- 0.50 packs/day for 35 years    Types: Cigarettes    Quit date: 12/26/1993  . Smokeless tobacco: Never Used  . Alcohol Use: Yes     Comment: very rarely wine, on holidays   . Drug Use: No  . Sexual Activity: Not on file   Other Topics Concern  . Not on file   Social History Narrative    ECOG Status: 1 - Symptomatic but completely ambulatory  Review of Systems: A 12 point ROS discussed and pertinent positives are indicated in the HPI above.  All other systems are negative.  Review of Systems  Vital Signs: BP 126/61 mmHg  Pulse 64  Temp(Src) 98.7 F (37.1 C) (Oral)  Resp 14  SpO2 95%  Physical Exam  Constitutional: She appears well-developed and well-nourished. No distress.  Cardiovascular: Normal rate and regular rhythm.   No murmur heard. Pulmonary/Chest: Effort normal and breath sounds normal. No respiratory distress.  Abdominal: Soft. Bowel sounds are normal. She exhibits no distension and no mass. There is no tenderness.  Musculoskeletal:  Right femoral puncture site is well-healed. Normal femoral and pedal pulses. No delayed vascular complication.  Skin: She is not diaphoretic.    Mallampati Score:   2  Imaging: Ir Angiogram Visceral Selective  01/19/2016   INDICATION: Cirrhosis, 4 cm well differentiated hepatocellular carcinoma EXAM: IR EMBO TUMOR ORGAN ISCHEMIA INFARCT INC GUIDE ROADMAPPING; ADDITIONAL ARTERIOGRAPHY; SELECTIVE VISCERAL ARTERIOGRAPHY; IR ULTRASOUND GUIDANCE VASC ACCESS RIGHT MEDICATIONS: 3.375 g Zosyn. The antibiotic was administered within 1 hour of the procedure. 100 mcg nitroglycerin intra-arterial prior to embolization. ANESTHESIA/SEDATION: Moderate (conscious) sedation was employed during this procedure. A total of Versed 1.5 mg and Fentanyl 50 mcg was administered intravenously. Moderate Sedation Time: 63 minutes. The patient's level of consciousness and vital signs were monitored continuously by radiology nursing throughout the procedure under my direct supervision. CONTRAST:  82mL OMNIPAQUE IOHEXOL 300 MG/ML SOLN, 74mL OMNIPAQUE IOHEXOL 300 MG/ML SOLN, 72mL OMNIPAQUE IOHEXOL 300 MG/ML SOLN, 25mL OMNIPAQUE IOHEXOL 300 MG/ML SOLN FLUOROSCOPY TIME:  Fluoroscopy Time: 19 minutes 12 seconds (3,472 mGy). COMPLICATIONS: None immediate. PROCEDURE: Informed consent was obtained from the patient following explanation of the procedure, risks, benefits and alternatives. The patient understands, agrees and consents for the procedure. All questions were addressed. A time out was performed prior to the initiation of the procedure. Maximal barrier sterile technique utilized including caps, mask, sterile gowns, sterile gloves, large sterile drape, hand hygiene, and Betadine prep. Previous imaging reviewed. Under sterile conditions and local anesthesia, the right common femoral artery was localized, demonstrated to be patent, and ultrasound micropuncture access performed. Images obtained for documentation. Five French sheath inserted over a guidewire. Initially a C2 catheter was utilized to select the SMA. SMA angiogram performed. SMA: Minor atherosclerotic change. Origin widely patent. No significant replaced hepatic vasculature or arterial supply to the known  hepatocellular carcinoma from the SMA. Catheter was exchanged for a SOS Omni select catheter. This catheter was utilized to select the celiac origin. Selective celiac angiogram performed. Celiac origin is widely patent. The left gastric, splenic and hepatic branches are all patent. Renegade high flow micro catheter and a micro Glidewire were advanced into the proper hepatic artery. Selective proper hepatic angiogram performed. Proper hepatic angiogram: The common, proper, right and left hepatic arteries are all patent. Gastroduodenal artery is patent. Tumor vascularity/blushing noted predominately from peripheral right hepatic arteries. Micro catheter was advanced into a peripheral right hepatic artery. Selective peripheral right hepatic angiogram performed. First Peripheral right hepatic  artery: Right peripheral hepatic branches remain patent. This initial peripheral right hepatic artery is not the dominant supply to the hepatic tumor. Small vascular branches are noted with faint blushing. Micro catheter was retracted and utilized to select a second peripheral right hepatic artery which has a more vertical orientation. Selective injection of the second peripheral right hepatic artery demonstrates the dominant supplying branch to the hepatic tumor with vascular tumor blushing. Drug-eluting beads / trans arterial chemo embolization: From this location in the dominant peripheral right hepatic artery, the drug-eluting beads / trans arterial chemo embolization was performed with delivery of 75 mg doxorubicin diluted with the LC beads and contrast. Embolization performed to near complete stasis within the peripheral right hepatic artery. Contrast staining noted of the hepatic tumor during the procedure. Micro catheter was retracted and utilized to select the left hepatic artery. Selective left hepatic angiogram performed. Left hepatic artery is widely patent. No significant residual or additional arterial supply to the  hepatic tumor via the left hepatic vasculature. Access removed. Hemostasis obtained with manual compression. Patient tolerated the procedure well. No immediate complication. IMPRESSION: Successful visceral angiograms and peripheral right hepatic artery drug-eluting beads / trans arterial chemoembolization to the dominant peripheral right hepatic artery supplying the known hepatocellular carcinoma. Electronically Signed   By: Jerilynn Mages.  Leonte Horrigan M.D.   On: 01/19/2016 14:20   Ir Angiogram Visceral Selective  01/19/2016  INDICATION: Cirrhosis, 4 cm well differentiated hepatocellular carcinoma EXAM: IR EMBO TUMOR ORGAN ISCHEMIA INFARCT INC GUIDE ROADMAPPING; ADDITIONAL ARTERIOGRAPHY; SELECTIVE VISCERAL ARTERIOGRAPHY; IR ULTRASOUND GUIDANCE VASC ACCESS RIGHT MEDICATIONS: 3.375 g Zosyn. The antibiotic was administered within 1 hour of the procedure. 100 mcg nitroglycerin intra-arterial prior to embolization. ANESTHESIA/SEDATION: Moderate (conscious) sedation was employed during this procedure. A total of Versed 1.5 mg and Fentanyl 50 mcg was administered intravenously. Moderate Sedation Time: 63 minutes. The patient's level of consciousness and vital signs were monitored continuously by radiology nursing throughout the procedure under my direct supervision. CONTRAST:  79mL OMNIPAQUE IOHEXOL 300 MG/ML SOLN, 67mL OMNIPAQUE IOHEXOL 300 MG/ML SOLN, 65mL OMNIPAQUE IOHEXOL 300 MG/ML SOLN, 39mL OMNIPAQUE IOHEXOL 300 MG/ML SOLN FLUOROSCOPY TIME:  Fluoroscopy Time: 19 minutes 12 seconds (3,472 mGy). COMPLICATIONS: None immediate. PROCEDURE: Informed consent was obtained from the patient following explanation of the procedure, risks, benefits and alternatives. The patient understands, agrees and consents for the procedure. All questions were addressed. A time out was performed prior to the initiation of the procedure. Maximal barrier sterile technique utilized including caps, mask, sterile gowns, sterile gloves, large sterile drape, hand  hygiene, and Betadine prep. Previous imaging reviewed. Under sterile conditions and local anesthesia, the right common femoral artery was localized, demonstrated to be patent, and ultrasound micropuncture access performed. Images obtained for documentation. Five French sheath inserted over a guidewire. Initially a C2 catheter was utilized to select the SMA. SMA angiogram performed. SMA: Minor atherosclerotic change. Origin widely patent. No significant replaced hepatic vasculature or arterial supply to the known hepatocellular carcinoma from the SMA. Catheter was exchanged for a SOS Omni select catheter. This catheter was utilized to select the celiac origin. Selective celiac angiogram performed. Celiac origin is widely patent. The left gastric, splenic and hepatic branches are all patent. Renegade high flow micro catheter and a micro Glidewire were advanced into the proper hepatic artery. Selective proper hepatic angiogram performed. Proper hepatic angiogram: The common, proper, right and left hepatic arteries are all patent. Gastroduodenal artery is patent. Tumor vascularity/blushing noted predominately from peripheral right hepatic arteries. Micro catheter was  advanced into a peripheral right hepatic artery. Selective peripheral right hepatic angiogram performed. First Peripheral right hepatic artery: Right peripheral hepatic branches remain patent. This initial peripheral right hepatic artery is not the dominant supply to the hepatic tumor. Small vascular branches are noted with faint blushing. Micro catheter was retracted and utilized to select a second peripheral right hepatic artery which has a more vertical orientation. Selective injection of the second peripheral right hepatic artery demonstrates the dominant supplying branch to the hepatic tumor with vascular tumor blushing. Drug-eluting beads / trans arterial chemo embolization: From this location in the dominant peripheral right hepatic artery, the  drug-eluting beads / trans arterial chemo embolization was performed with delivery of 75 mg doxorubicin diluted with the LC beads and contrast. Embolization performed to near complete stasis within the peripheral right hepatic artery. Contrast staining noted of the hepatic tumor during the procedure. Micro catheter was retracted and utilized to select the left hepatic artery. Selective left hepatic angiogram performed. Left hepatic artery is widely patent. No significant residual or additional arterial supply to the hepatic tumor via the left hepatic vasculature. Access removed. Hemostasis obtained with manual compression. Patient tolerated the procedure well. No immediate complication. IMPRESSION: Successful visceral angiograms and peripheral right hepatic artery drug-eluting beads / trans arterial chemoembolization to the dominant peripheral right hepatic artery supplying the known hepatocellular carcinoma. Electronically Signed   By: Jerilynn Mages.  Lev Cervone M.D.   On: 01/19/2016 14:20   Ir Angiogram Selective Each Additional Vessel  01/19/2016  INDICATION: Cirrhosis, 4 cm well differentiated hepatocellular carcinoma EXAM: IR EMBO TUMOR ORGAN ISCHEMIA INFARCT INC GUIDE ROADMAPPING; ADDITIONAL ARTERIOGRAPHY; SELECTIVE VISCERAL ARTERIOGRAPHY; IR ULTRASOUND GUIDANCE VASC ACCESS RIGHT MEDICATIONS: 3.375 g Zosyn. The antibiotic was administered within 1 hour of the procedure. 100 mcg nitroglycerin intra-arterial prior to embolization. ANESTHESIA/SEDATION: Moderate (conscious) sedation was employed during this procedure. A total of Versed 1.5 mg and Fentanyl 50 mcg was administered intravenously. Moderate Sedation Time: 63 minutes. The patient's level of consciousness and vital signs were monitored continuously by radiology nursing throughout the procedure under my direct supervision. CONTRAST:  7mL OMNIPAQUE IOHEXOL 300 MG/ML SOLN, 27mL OMNIPAQUE IOHEXOL 300 MG/ML SOLN, 26mL OMNIPAQUE IOHEXOL 300 MG/ML SOLN, 31mL OMNIPAQUE  IOHEXOL 300 MG/ML SOLN FLUOROSCOPY TIME:  Fluoroscopy Time: 19 minutes 12 seconds (3,472 mGy). COMPLICATIONS: None immediate. PROCEDURE: Informed consent was obtained from the patient following explanation of the procedure, risks, benefits and alternatives. The patient understands, agrees and consents for the procedure. All questions were addressed. A time out was performed prior to the initiation of the procedure. Maximal barrier sterile technique utilized including caps, mask, sterile gowns, sterile gloves, large sterile drape, hand hygiene, and Betadine prep. Previous imaging reviewed. Under sterile conditions and local anesthesia, the right common femoral artery was localized, demonstrated to be patent, and ultrasound micropuncture access performed. Images obtained for documentation. Five French sheath inserted over a guidewire. Initially a C2 catheter was utilized to select the SMA. SMA angiogram performed. SMA: Minor atherosclerotic change. Origin widely patent. No significant replaced hepatic vasculature or arterial supply to the known hepatocellular carcinoma from the SMA. Catheter was exchanged for a SOS Omni select catheter. This catheter was utilized to select the celiac origin. Selective celiac angiogram performed. Celiac origin is widely patent. The left gastric, splenic and hepatic branches are all patent. Renegade high flow micro catheter and a micro Glidewire were advanced into the proper hepatic artery. Selective proper hepatic angiogram performed. Proper hepatic angiogram: The common, proper, right and left hepatic arteries  are all patent. Gastroduodenal artery is patent. Tumor vascularity/blushing noted predominately from peripheral right hepatic arteries. Micro catheter was advanced into a peripheral right hepatic artery. Selective peripheral right hepatic angiogram performed. First Peripheral right hepatic artery: Right peripheral hepatic branches remain patent. This initial peripheral right  hepatic artery is not the dominant supply to the hepatic tumor. Small vascular branches are noted with faint blushing. Micro catheter was retracted and utilized to select a second peripheral right hepatic artery which has a more vertical orientation. Selective injection of the second peripheral right hepatic artery demonstrates the dominant supplying branch to the hepatic tumor with vascular tumor blushing. Drug-eluting beads / trans arterial chemo embolization: From this location in the dominant peripheral right hepatic artery, the drug-eluting beads / trans arterial chemo embolization was performed with delivery of 75 mg doxorubicin diluted with the LC beads and contrast. Embolization performed to near complete stasis within the peripheral right hepatic artery. Contrast staining noted of the hepatic tumor during the procedure. Micro catheter was retracted and utilized to select the left hepatic artery. Selective left hepatic angiogram performed. Left hepatic artery is widely patent. No significant residual or additional arterial supply to the hepatic tumor via the left hepatic vasculature. Access removed. Hemostasis obtained with manual compression. Patient tolerated the procedure well. No immediate complication. IMPRESSION: Successful visceral angiograms and peripheral right hepatic artery drug-eluting beads / trans arterial chemoembolization to the dominant peripheral right hepatic artery supplying the known hepatocellular carcinoma. Electronically Signed   By: Jerilynn Mages.  Keyshon Stein M.D.   On: 01/19/2016 14:20   Ir Angiogram Selective Each Additional Vessel  01/19/2016  INDICATION: Cirrhosis, 4 cm well differentiated hepatocellular carcinoma EXAM: IR EMBO TUMOR ORGAN ISCHEMIA INFARCT INC GUIDE ROADMAPPING; ADDITIONAL ARTERIOGRAPHY; SELECTIVE VISCERAL ARTERIOGRAPHY; IR ULTRASOUND GUIDANCE VASC ACCESS RIGHT MEDICATIONS: 3.375 g Zosyn. The antibiotic was administered within 1 hour of the procedure. 100 mcg nitroglycerin  intra-arterial prior to embolization. ANESTHESIA/SEDATION: Moderate (conscious) sedation was employed during this procedure. A total of Versed 1.5 mg and Fentanyl 50 mcg was administered intravenously. Moderate Sedation Time: 63 minutes. The patient's level of consciousness and vital signs were monitored continuously by radiology nursing throughout the procedure under my direct supervision. CONTRAST:  48mL OMNIPAQUE IOHEXOL 300 MG/ML SOLN, 30mL OMNIPAQUE IOHEXOL 300 MG/ML SOLN, 82mL OMNIPAQUE IOHEXOL 300 MG/ML SOLN, 35mL OMNIPAQUE IOHEXOL 300 MG/ML SOLN FLUOROSCOPY TIME:  Fluoroscopy Time: 19 minutes 12 seconds (3,472 mGy). COMPLICATIONS: None immediate. PROCEDURE: Informed consent was obtained from the patient following explanation of the procedure, risks, benefits and alternatives. The patient understands, agrees and consents for the procedure. All questions were addressed. A time out was performed prior to the initiation of the procedure. Maximal barrier sterile technique utilized including caps, mask, sterile gowns, sterile gloves, large sterile drape, hand hygiene, and Betadine prep. Previous imaging reviewed. Under sterile conditions and local anesthesia, the right common femoral artery was localized, demonstrated to be patent, and ultrasound micropuncture access performed. Images obtained for documentation. Five French sheath inserted over a guidewire. Initially a C2 catheter was utilized to select the SMA. SMA angiogram performed. SMA: Minor atherosclerotic change. Origin widely patent. No significant replaced hepatic vasculature or arterial supply to the known hepatocellular carcinoma from the SMA. Catheter was exchanged for a SOS Omni select catheter. This catheter was utilized to select the celiac origin. Selective celiac angiogram performed. Celiac origin is widely patent. The left gastric, splenic and hepatic branches are all patent. Renegade high flow micro catheter and a micro Glidewire were advanced  into  the proper hepatic artery. Selective proper hepatic angiogram performed. Proper hepatic angiogram: The common, proper, right and left hepatic arteries are all patent. Gastroduodenal artery is patent. Tumor vascularity/blushing noted predominately from peripheral right hepatic arteries. Micro catheter was advanced into a peripheral right hepatic artery. Selective peripheral right hepatic angiogram performed. First Peripheral right hepatic artery: Right peripheral hepatic branches remain patent. This initial peripheral right hepatic artery is not the dominant supply to the hepatic tumor. Small vascular branches are noted with faint blushing. Micro catheter was retracted and utilized to select a second peripheral right hepatic artery which has a more vertical orientation. Selective injection of the second peripheral right hepatic artery demonstrates the dominant supplying branch to the hepatic tumor with vascular tumor blushing. Drug-eluting beads / trans arterial chemo embolization: From this location in the dominant peripheral right hepatic artery, the drug-eluting beads / trans arterial chemo embolization was performed with delivery of 75 mg doxorubicin diluted with the LC beads and contrast. Embolization performed to near complete stasis within the peripheral right hepatic artery. Contrast staining noted of the hepatic tumor during the procedure. Micro catheter was retracted and utilized to select the left hepatic artery. Selective left hepatic angiogram performed. Left hepatic artery is widely patent. No significant residual or additional arterial supply to the hepatic tumor via the left hepatic vasculature. Access removed. Hemostasis obtained with manual compression. Patient tolerated the procedure well. No immediate complication. IMPRESSION: Successful visceral angiograms and peripheral right hepatic artery drug-eluting beads / trans arterial chemoembolization to the dominant peripheral right hepatic artery  supplying the known hepatocellular carcinoma. Electronically Signed   By: Jerilynn Mages.  Cierah Crader M.D.   On: 01/19/2016 14:20   Ir Angiogram Selective Each Additional Vessel  01/19/2016  INDICATION: Cirrhosis, 4 cm well differentiated hepatocellular carcinoma EXAM: IR EMBO TUMOR ORGAN ISCHEMIA INFARCT INC GUIDE ROADMAPPING; ADDITIONAL ARTERIOGRAPHY; SELECTIVE VISCERAL ARTERIOGRAPHY; IR ULTRASOUND GUIDANCE VASC ACCESS RIGHT MEDICATIONS: 3.375 g Zosyn. The antibiotic was administered within 1 hour of the procedure. 100 mcg nitroglycerin intra-arterial prior to embolization. ANESTHESIA/SEDATION: Moderate (conscious) sedation was employed during this procedure. A total of Versed 1.5 mg and Fentanyl 50 mcg was administered intravenously. Moderate Sedation Time: 63 minutes. The patient's level of consciousness and vital signs were monitored continuously by radiology nursing throughout the procedure under my direct supervision. CONTRAST:  41mL OMNIPAQUE IOHEXOL 300 MG/ML SOLN, 66mL OMNIPAQUE IOHEXOL 300 MG/ML SOLN, 14mL OMNIPAQUE IOHEXOL 300 MG/ML SOLN, 30mL OMNIPAQUE IOHEXOL 300 MG/ML SOLN FLUOROSCOPY TIME:  Fluoroscopy Time: 19 minutes 12 seconds (3,472 mGy). COMPLICATIONS: None immediate. PROCEDURE: Informed consent was obtained from the patient following explanation of the procedure, risks, benefits and alternatives. The patient understands, agrees and consents for the procedure. All questions were addressed. A time out was performed prior to the initiation of the procedure. Maximal barrier sterile technique utilized including caps, mask, sterile gowns, sterile gloves, large sterile drape, hand hygiene, and Betadine prep. Previous imaging reviewed. Under sterile conditions and local anesthesia, the right common femoral artery was localized, demonstrated to be patent, and ultrasound micropuncture access performed. Images obtained for documentation. Five French sheath inserted over a guidewire. Initially a C2 catheter was utilized  to select the SMA. SMA angiogram performed. SMA: Minor atherosclerotic change. Origin widely patent. No significant replaced hepatic vasculature or arterial supply to the known hepatocellular carcinoma from the SMA. Catheter was exchanged for a SOS Omni select catheter. This catheter was utilized to select the celiac origin. Selective celiac angiogram performed. Celiac origin is widely patent. The left gastric,  splenic and hepatic branches are all patent. Renegade high flow micro catheter and a micro Glidewire were advanced into the proper hepatic artery. Selective proper hepatic angiogram performed. Proper hepatic angiogram: The common, proper, right and left hepatic arteries are all patent. Gastroduodenal artery is patent. Tumor vascularity/blushing noted predominately from peripheral right hepatic arteries. Micro catheter was advanced into a peripheral right hepatic artery. Selective peripheral right hepatic angiogram performed. First Peripheral right hepatic artery: Right peripheral hepatic branches remain patent. This initial peripheral right hepatic artery is not the dominant supply to the hepatic tumor. Small vascular branches are noted with faint blushing. Micro catheter was retracted and utilized to select a second peripheral right hepatic artery which has a more vertical orientation. Selective injection of the second peripheral right hepatic artery demonstrates the dominant supplying branch to the hepatic tumor with vascular tumor blushing. Drug-eluting beads / trans arterial chemo embolization: From this location in the dominant peripheral right hepatic artery, the drug-eluting beads / trans arterial chemo embolization was performed with delivery of 75 mg doxorubicin diluted with the LC beads and contrast. Embolization performed to near complete stasis within the peripheral right hepatic artery. Contrast staining noted of the hepatic tumor during the procedure. Micro catheter was retracted and utilized to  select the left hepatic artery. Selective left hepatic angiogram performed. Left hepatic artery is widely patent. No significant residual or additional arterial supply to the hepatic tumor via the left hepatic vasculature. Access removed. Hemostasis obtained with manual compression. Patient tolerated the procedure well. No immediate complication. IMPRESSION: Successful visceral angiograms and peripheral right hepatic artery drug-eluting beads / trans arterial chemoembolization to the dominant peripheral right hepatic artery supplying the known hepatocellular carcinoma. Electronically Signed   By: Jerilynn Mages.  Zaire Vanbuskirk M.D.   On: 01/19/2016 14:20   Ir Angiogram Selective Each Additional Vessel  01/19/2016  INDICATION: Cirrhosis, 4 cm well differentiated hepatocellular carcinoma EXAM: IR EMBO TUMOR ORGAN ISCHEMIA INFARCT INC GUIDE ROADMAPPING; ADDITIONAL ARTERIOGRAPHY; SELECTIVE VISCERAL ARTERIOGRAPHY; IR ULTRASOUND GUIDANCE VASC ACCESS RIGHT MEDICATIONS: 3.375 g Zosyn. The antibiotic was administered within 1 hour of the procedure. 100 mcg nitroglycerin intra-arterial prior to embolization. ANESTHESIA/SEDATION: Moderate (conscious) sedation was employed during this procedure. A total of Versed 1.5 mg and Fentanyl 50 mcg was administered intravenously. Moderate Sedation Time: 63 minutes. The patient's level of consciousness and vital signs were monitored continuously by radiology nursing throughout the procedure under my direct supervision. CONTRAST:  60mL OMNIPAQUE IOHEXOL 300 MG/ML SOLN, 56mL OMNIPAQUE IOHEXOL 300 MG/ML SOLN, 44mL OMNIPAQUE IOHEXOL 300 MG/ML SOLN, 100mL OMNIPAQUE IOHEXOL 300 MG/ML SOLN FLUOROSCOPY TIME:  Fluoroscopy Time: 19 minutes 12 seconds (3,472 mGy). COMPLICATIONS: None immediate. PROCEDURE: Informed consent was obtained from the patient following explanation of the procedure, risks, benefits and alternatives. The patient understands, agrees and consents for the procedure. All questions were addressed. A  time out was performed prior to the initiation of the procedure. Maximal barrier sterile technique utilized including caps, mask, sterile gowns, sterile gloves, large sterile drape, hand hygiene, and Betadine prep. Previous imaging reviewed. Under sterile conditions and local anesthesia, the right common femoral artery was localized, demonstrated to be patent, and ultrasound micropuncture access performed. Images obtained for documentation. Five French sheath inserted over a guidewire. Initially a C2 catheter was utilized to select the SMA. SMA angiogram performed. SMA: Minor atherosclerotic change. Origin widely patent. No significant replaced hepatic vasculature or arterial supply to the known hepatocellular carcinoma from the SMA. Catheter was exchanged for a SOS Omni select catheter. This catheter  was utilized to select the celiac origin. Selective celiac angiogram performed. Celiac origin is widely patent. The left gastric, splenic and hepatic branches are all patent. Renegade high flow micro catheter and a micro Glidewire were advanced into the proper hepatic artery. Selective proper hepatic angiogram performed. Proper hepatic angiogram: The common, proper, right and left hepatic arteries are all patent. Gastroduodenal artery is patent. Tumor vascularity/blushing noted predominately from peripheral right hepatic arteries. Micro catheter was advanced into a peripheral right hepatic artery. Selective peripheral right hepatic angiogram performed. First Peripheral right hepatic artery: Right peripheral hepatic branches remain patent. This initial peripheral right hepatic artery is not the dominant supply to the hepatic tumor. Small vascular branches are noted with faint blushing. Micro catheter was retracted and utilized to select a second peripheral right hepatic artery which has a more vertical orientation. Selective injection of the second peripheral right hepatic artery demonstrates the dominant supplying  branch to the hepatic tumor with vascular tumor blushing. Drug-eluting beads / trans arterial chemo embolization: From this location in the dominant peripheral right hepatic artery, the drug-eluting beads / trans arterial chemo embolization was performed with delivery of 75 mg doxorubicin diluted with the LC beads and contrast. Embolization performed to near complete stasis within the peripheral right hepatic artery. Contrast staining noted of the hepatic tumor during the procedure. Micro catheter was retracted and utilized to select the left hepatic artery. Selective left hepatic angiogram performed. Left hepatic artery is widely patent. No significant residual or additional arterial supply to the hepatic tumor via the left hepatic vasculature. Access removed. Hemostasis obtained with manual compression. Patient tolerated the procedure well. No immediate complication. IMPRESSION: Successful visceral angiograms and peripheral right hepatic artery drug-eluting beads / trans arterial chemoembolization to the dominant peripheral right hepatic artery supplying the known hepatocellular carcinoma. Electronically Signed   By: Jerilynn Mages.  Janel Beane M.D.   On: 01/19/2016 14:20   Ir US Guide Vasc Access Right  01/19/2016  INDICATION: Cirrhosis, 4 cm well differentiated hepatocellular carcinoma EXAM: IR EMBO TUMOR ORGAN ISCHEMIA INFARCT INC GUIDE ROADMAPPING; ADDITIONAL ARTERIOGRAPHY; SELECTIVE VISCERAL ARTERIOGRAPHY; IR ULTRASOUND GUIDANCE VASC ACCESS RIGHT MEDICATIONS: 3.375 g Zosyn. The antibiotic was administered within 1 hour of the procedure. 100 mcg nitroglycerin intra-arterial prior to embolization. ANESTHESIA/SEDATION: Moderate (conscious) sedation was employed during this procedure. A total of Versed 1.5 mg and Fentanyl 50 mcg was administered intravenously. Moderate Sedation Time: 63 minutes. The patient's level of consciousness and vital signs were monitored continuously by radiology nursing throughout the procedure under  my direct supervision. CONTRAST:  64mL OMNIPAQUE IOHEXOL 300 MG/ML SOLN, 65mL OMNIPAQUE IOHEXOL 300 MG/ML SOLN, 47mL OMNIPAQUE IOHEXOL 300 MG/ML SOLN, 21mL OMNIPAQUE IOHEXOL 300 MG/ML SOLN FLUOROSCOPY TIME:  Fluoroscopy Time: 19 minutes 12 seconds (3,472 mGy). COMPLICATIONS: None immediate. PROCEDURE: Informed consent was obtained from the patient following explanation of the procedure, risks, benefits and alternatives. The patient understands, agrees and consents for the procedure. All questions were addressed. A time out was performed prior to the initiation of the procedure. Maximal barrier sterile technique utilized including caps, mask, sterile gowns, sterile gloves, large sterile drape, hand hygiene, and Betadine prep. Previous imaging reviewed. Under sterile conditions and local anesthesia, the right common femoral artery was localized, demonstrated to be patent, and ultrasound micropuncture access performed. Images obtained for documentation. Five French sheath inserted over a guidewire. Initially a C2 catheter was utilized to select the SMA. SMA angiogram performed. SMA: Minor atherosclerotic change. Origin widely patent. No significant replaced hepatic vasculature or arterial supply  to the known hepatocellular carcinoma from the SMA. Catheter was exchanged for a SOS Omni select catheter. This catheter was utilized to select the celiac origin. Selective celiac angiogram performed. Celiac origin is widely patent. The left gastric, splenic and hepatic branches are all patent. Renegade high flow micro catheter and a micro Glidewire were advanced into the proper hepatic artery. Selective proper hepatic angiogram performed. Proper hepatic angiogram: The common, proper, right and left hepatic arteries are all patent. Gastroduodenal artery is patent. Tumor vascularity/blushing noted predominately from peripheral right hepatic arteries. Micro catheter was advanced into a peripheral right hepatic artery. Selective  peripheral right hepatic angiogram performed. First Peripheral right hepatic artery: Right peripheral hepatic branches remain patent. This initial peripheral right hepatic artery is not the dominant supply to the hepatic tumor. Small vascular branches are noted with faint blushing. Micro catheter was retracted and utilized to select a second peripheral right hepatic artery which has a more vertical orientation. Selective injection of the second peripheral right hepatic artery demonstrates the dominant supplying branch to the hepatic tumor with vascular tumor blushing. Drug-eluting beads / trans arterial chemo embolization: From this location in the dominant peripheral right hepatic artery, the drug-eluting beads / trans arterial chemo embolization was performed with delivery of 75 mg doxorubicin diluted with the LC beads and contrast. Embolization performed to near complete stasis within the peripheral right hepatic artery. Contrast staining noted of the hepatic tumor during the procedure. Micro catheter was retracted and utilized to select the left hepatic artery. Selective left hepatic angiogram performed. Left hepatic artery is widely patent. No significant residual or additional arterial supply to the hepatic tumor via the left hepatic vasculature. Access removed. Hemostasis obtained with manual compression. Patient tolerated the procedure well. No immediate complication. IMPRESSION: Successful visceral angiograms and peripheral right hepatic artery drug-eluting beads / trans arterial chemoembolization to the dominant peripheral right hepatic artery supplying the known hepatocellular carcinoma. Electronically Signed   By: Jerilynn Mages.  Edison Nicholson M.D.   On: 01/19/2016 14:20   Ir Embo Tumor Organ Ischemia Infarct Inc Guide Roadmapping  01/19/2016  INDICATION: Cirrhosis, 4 cm well differentiated hepatocellular carcinoma EXAM: IR EMBO TUMOR ORGAN ISCHEMIA INFARCT INC GUIDE ROADMAPPING; ADDITIONAL ARTERIOGRAPHY; SELECTIVE  VISCERAL ARTERIOGRAPHY; IR ULTRASOUND GUIDANCE VASC ACCESS RIGHT MEDICATIONS: 3.375 g Zosyn. The antibiotic was administered within 1 hour of the procedure. 100 mcg nitroglycerin intra-arterial prior to embolization. ANESTHESIA/SEDATION: Moderate (conscious) sedation was employed during this procedure. A total of Versed 1.5 mg and Fentanyl 50 mcg was administered intravenously. Moderate Sedation Time: 63 minutes. The patient's level of consciousness and vital signs were monitored continuously by radiology nursing throughout the procedure under my direct supervision. CONTRAST:  70mL OMNIPAQUE IOHEXOL 300 MG/ML SOLN, 31mL OMNIPAQUE IOHEXOL 300 MG/ML SOLN, 39mL OMNIPAQUE IOHEXOL 300 MG/ML SOLN, 67mL OMNIPAQUE IOHEXOL 300 MG/ML SOLN FLUOROSCOPY TIME:  Fluoroscopy Time: 19 minutes 12 seconds (3,472 mGy). COMPLICATIONS: None immediate. PROCEDURE: Informed consent was obtained from the patient following explanation of the procedure, risks, benefits and alternatives. The patient understands, agrees and consents for the procedure. All questions were addressed. A time out was performed prior to the initiation of the procedure. Maximal barrier sterile technique utilized including caps, mask, sterile gowns, sterile gloves, large sterile drape, hand hygiene, and Betadine prep. Previous imaging reviewed. Under sterile conditions and local anesthesia, the right common femoral artery was localized, demonstrated to be patent, and ultrasound micropuncture access performed. Images obtained for documentation. Five French sheath inserted over a guidewire. Initially a C2 catheter was utilized  to select the SMA. SMA angiogram performed. SMA: Minor atherosclerotic change. Origin widely patent. No significant replaced hepatic vasculature or arterial supply to the known hepatocellular carcinoma from the SMA. Catheter was exchanged for a SOS Omni select catheter. This catheter was utilized to select the celiac origin. Selective celiac  angiogram performed. Celiac origin is widely patent. The left gastric, splenic and hepatic branches are all patent. Renegade high flow micro catheter and a micro Glidewire were advanced into the proper hepatic artery. Selective proper hepatic angiogram performed. Proper hepatic angiogram: The common, proper, right and left hepatic arteries are all patent. Gastroduodenal artery is patent. Tumor vascularity/blushing noted predominately from peripheral right hepatic arteries. Micro catheter was advanced into a peripheral right hepatic artery. Selective peripheral right hepatic angiogram performed. First Peripheral right hepatic artery: Right peripheral hepatic branches remain patent. This initial peripheral right hepatic artery is not the dominant supply to the hepatic tumor. Small vascular branches are noted with faint blushing. Micro catheter was retracted and utilized to select a second peripheral right hepatic artery which has a more vertical orientation. Selective injection of the second peripheral right hepatic artery demonstrates the dominant supplying branch to the hepatic tumor with vascular tumor blushing. Drug-eluting beads / trans arterial chemo embolization: From this location in the dominant peripheral right hepatic artery, the drug-eluting beads / trans arterial chemo embolization was performed with delivery of 75 mg doxorubicin diluted with the LC beads and contrast. Embolization performed to near complete stasis within the peripheral right hepatic artery. Contrast staining noted of the hepatic tumor during the procedure. Micro catheter was retracted and utilized to select the left hepatic artery. Selective left hepatic angiogram performed. Left hepatic artery is widely patent. No significant residual or additional arterial supply to the hepatic tumor via the left hepatic vasculature. Access removed. Hemostasis obtained with manual compression. Patient tolerated the procedure well. No immediate  complication. IMPRESSION: Successful visceral angiograms and peripheral right hepatic artery drug-eluting beads / trans arterial chemoembolization to the dominant peripheral right hepatic artery supplying the known hepatocellular carcinoma. Electronically Signed   By: Jerilynn Mages.  Decklin Weddington M.D.   On: 01/19/2016 14:20    Labs:  CBC:  Recent Labs  11/21/15 1257 12/05/15 1219 01/19/16 0930 01/20/16 0522  WBC 5.5 4.7 5.0 8.1  HGB 13.5 12.8 12.2 13.0  HCT 41.4 39.9 37.0 41.1  PLT 141* 105* 109* 123*    COAGS:  Recent Labs  01/19/16 0930  INR 0.94  APTT 30    BMP:  Recent Labs  10/07/15 1438 11/21/15 1257 12/05/15 1219 01/19/16 0930 01/20/16 0522  NA 143 142 146* 143 141  K 3.6 4.0 4.0 3.7 3.7  CL 107  --   --  107 106  CO2 30 24 28 26 25   GLUCOSE 109* 91 82 140* 163*  BUN 9 15.2 12.1 16 12   CALCIUM 9.2 9.6 9.4 8.8* 8.8*  CREATININE 0.64 0.8 0.8 0.68 0.57  GFRNONAA >60  --   --  >60 >60  GFRAA >60  --   --  >60 >60    LIVER FUNCTION TESTS:  Recent Labs  11/21/15 1257 12/05/15 1219 01/19/16 0930 01/20/16 0522  BILITOT 0.71 0.60 0.4 0.6  AST 27 21 25 27   ALT 30 22 28 30   ALKPHOS 126 120 113 101  PROT 7.5 6.9 6.9 6.6  ALBUMIN 4.1 3.7 4.0 3.8    TUMOR MARKERS: No results for input(s): AFPTM, CEA, CA199, CHROMGRNA in the last 8760 hours.  Assessment and Plan:  1 month status post DEB TACE for well-differentiated hepatocellular carcinoma within the right liver. She has recovered at home with mild post embolization syndrome symptoms. She had mild fatigue, myalgias following the procedure for approximately 10 days. Now she is back to her baseline. She returns for outpatient follow-up. Overall she is doing very well. We discussed and reviewed the DEB TACE treatment.   Plan: Schedule for repeat MRI without and with contrast to reassess lesion size. Based on response she may be an ablation candidate versus repeating the DEB TACE. Imaging will be performed 02/13/2016 at  [facility].   Thank you for this interesting consult.  I greatly enjoyed meeting Megan Horton and look forward to participating in their care.  A copy of this report was sent to the requesting provider on this date.  Electronically Signed: Greggory Keen 02/10/2016, 11:32 AM   I spent a total of    25 Minutes in face to face in clinical consultation, greater than 50% of which was counseling/coordinating care for This patient status post DEB TACE for well-differentiated hepatocellular carcinoma.

## 2016-02-13 ENCOUNTER — Ambulatory Visit (HOSPITAL_COMMUNITY)
Admission: RE | Admit: 2016-02-13 | Discharge: 2016-02-13 | Disposition: A | Discharge status: Home / Self Care | Payer: Medicare Other | Source: Ambulatory Visit | Attending: Interventional Radiology | Admitting: Interventional Radiology

## 2016-02-13 DIAGNOSIS — K7460 Unspecified cirrhosis of liver: Secondary | ICD-10-CM | POA: Diagnosis not present

## 2016-02-13 DIAGNOSIS — K760 Fatty (change of) liver, not elsewhere classified: Secondary | ICD-10-CM | POA: Diagnosis not present

## 2016-02-13 DIAGNOSIS — C220 Liver cell carcinoma: Secondary | ICD-10-CM | POA: Diagnosis not present

## 2016-02-13 MED ORDER — GADOXETATE DISODIUM 0.25 MMOL/ML IV SOLN
5.0000 mL | Freq: Once | INTRAVENOUS | Status: AC | PRN
  Administered 2016-02-13: 10 mL via INTRAVENOUS

## 2016-03-16 DIAGNOSIS — I129 Hypertensive chronic kidney disease with stage 1 through stage 4 chronic kidney disease, or unspecified chronic kidney disease: Secondary | ICD-10-CM | POA: Diagnosis not present

## 2016-03-16 DIAGNOSIS — E1122 Type 2 diabetes mellitus with diabetic chronic kidney disease: Secondary | ICD-10-CM | POA: Diagnosis not present

## 2016-03-16 DIAGNOSIS — M8580 Other specified disorders of bone density and structure, unspecified site: Secondary | ICD-10-CM | POA: Diagnosis not present

## 2016-03-16 DIAGNOSIS — Z0000 Encounter for general adult medical examination without abnormal findings: Secondary | ICD-10-CM | POA: Diagnosis not present

## 2016-03-16 DIAGNOSIS — Z1389 Encounter for screening for other disorder: Secondary | ICD-10-CM | POA: Diagnosis not present

## 2016-03-16 DIAGNOSIS — E559 Vitamin D deficiency, unspecified: Secondary | ICD-10-CM | POA: Diagnosis not present

## 2016-03-18 DIAGNOSIS — Z1231 Encounter for screening mammogram for malignant neoplasm of breast: Secondary | ICD-10-CM | POA: Diagnosis not present

## 2016-03-23 DIAGNOSIS — I700 Atherosclerosis of aorta: Secondary | ICD-10-CM | POA: Diagnosis not present

## 2016-03-23 DIAGNOSIS — I129 Hypertensive chronic kidney disease with stage 1 through stage 4 chronic kidney disease, or unspecified chronic kidney disease: Secondary | ICD-10-CM | POA: Diagnosis not present

## 2016-03-23 DIAGNOSIS — M545 Low back pain: Secondary | ICD-10-CM | POA: Diagnosis not present

## 2016-03-23 DIAGNOSIS — E1122 Type 2 diabetes mellitus with diabetic chronic kidney disease: Secondary | ICD-10-CM | POA: Diagnosis not present

## 2016-03-23 DIAGNOSIS — S32010A Wedge compression fracture of first lumbar vertebra, initial encounter for closed fracture: Secondary | ICD-10-CM | POA: Diagnosis not present

## 2016-03-23 DIAGNOSIS — D696 Thrombocytopenia, unspecified: Secondary | ICD-10-CM | POA: Diagnosis not present

## 2016-04-12 ENCOUNTER — Encounter: Payer: Self-pay | Admitting: Hematology

## 2016-04-12 ENCOUNTER — Ambulatory Visit (HOSPITAL_BASED_OUTPATIENT_CLINIC_OR_DEPARTMENT_OTHER): Payer: Medicare Other | Admitting: Hematology

## 2016-04-12 ENCOUNTER — Telehealth: Payer: Self-pay | Admitting: Hematology

## 2016-04-12 ENCOUNTER — Other Ambulatory Visit (HOSPITAL_BASED_OUTPATIENT_CLINIC_OR_DEPARTMENT_OTHER): Payer: Medicare Other

## 2016-04-12 VITALS — BP 143/66 | HR 80 | Temp 99.0°F | Resp 18 | Ht 64.25 in | Wt 233.6 lb

## 2016-04-12 DIAGNOSIS — K760 Fatty (change of) liver, not elsewhere classified: Secondary | ICD-10-CM | POA: Diagnosis not present

## 2016-04-12 DIAGNOSIS — E119 Type 2 diabetes mellitus without complications: Secondary | ICD-10-CM

## 2016-04-12 DIAGNOSIS — E039 Hypothyroidism, unspecified: Secondary | ICD-10-CM | POA: Diagnosis not present

## 2016-04-12 DIAGNOSIS — I10 Essential (primary) hypertension: Secondary | ICD-10-CM

## 2016-04-12 DIAGNOSIS — C220 Liver cell carcinoma: Secondary | ICD-10-CM | POA: Diagnosis not present

## 2016-04-12 DIAGNOSIS — K7460 Unspecified cirrhosis of liver: Secondary | ICD-10-CM

## 2016-04-12 DIAGNOSIS — R160 Hepatomegaly, not elsewhere classified: Secondary | ICD-10-CM

## 2016-04-12 LAB — CBC WITH DIFFERENTIAL/PLATELET
BASO%: 0.6 % (ref 0.0–2.0)
BASOS ABS: 0 10*3/uL (ref 0.0–0.1)
EOS ABS: 0.3 10*3/uL (ref 0.0–0.5)
EOS%: 3.8 % (ref 0.0–7.0)
HEMATOCRIT: 40.1 % (ref 34.8–46.6)
HEMOGLOBIN: 12.9 g/dL (ref 11.6–15.9)
LYMPH%: 17.2 % (ref 14.0–49.7)
MCH: 28 pg (ref 25.1–34.0)
MCHC: 32.2 g/dL (ref 31.5–36.0)
MCV: 87 fL (ref 79.5–101.0)
MONO#: 0.7 10*3/uL (ref 0.1–0.9)
MONO%: 10.4 % (ref 0.0–14.0)
NEUT%: 68 % (ref 38.4–76.8)
NEUTROS ABS: 4.5 10*3/uL (ref 1.5–6.5)
Platelets: 100 10*3/uL — ABNORMAL LOW (ref 145–400)
RBC: 4.61 10*6/uL (ref 3.70–5.45)
RDW: 15.7 % — ABNORMAL HIGH (ref 11.2–14.5)
WBC: 6.6 10*3/uL (ref 3.9–10.3)
lymph#: 1.1 10*3/uL (ref 0.9–3.3)

## 2016-04-12 LAB — COMPREHENSIVE METABOLIC PANEL
ALBUMIN: 3.7 g/dL (ref 3.5–5.0)
ALK PHOS: 152 U/L — AB (ref 40–150)
ALT: 42 U/L (ref 0–55)
AST: 32 U/L (ref 5–34)
Anion Gap: 10 mEq/L (ref 3–11)
BILIRUBIN TOTAL: 0.81 mg/dL (ref 0.20–1.20)
BUN: 8.2 mg/dL (ref 7.0–26.0)
CALCIUM: 9.6 mg/dL (ref 8.4–10.4)
CO2: 28 mEq/L (ref 22–29)
CREATININE: 0.8 mg/dL (ref 0.6–1.1)
Chloride: 105 mEq/L (ref 98–109)
EGFR: 69 mL/min/{1.73_m2} — ABNORMAL LOW (ref >90)
Glucose: 163 mg/dl — ABNORMAL HIGH (ref 70–140)
POTASSIUM: 3.8 meq/L (ref 3.5–5.1)
Sodium: 143 mEq/L (ref 136–145)
TOTAL PROTEIN: 7.4 g/dL (ref 6.4–8.3)

## 2016-04-12 NOTE — Telephone Encounter (Signed)
Gave and printed avs.I sent Back to lab

## 2016-04-12 NOTE — Progress Notes (Signed)
Megan Horton  Telephone:([phone]) [phone] Fax:([phone]) [phone]  Clinic follow Up Note   Patient Care Team: Thressa Sheller, MD as PCP - General (Internal Medicine) Carol Ada, MD as Consulting Physician (Gastroenterology) Stark Klein, MD as Consulting Physician (General Surgery) 04/12/2016    CHIEF COMPLAINTS:  Follow up [facility]    Oncology History   Hepatocellular carcinoma [facility])   Staging form: Liver (Excluding Intrahepatic Bile Ducts), AJCC 7th Edition     Clinical stage from 12/01/2015: Stage I (T1, N0, M0) - Signed by Truitt Merle, MD on 04/12/2016       Hepatocellular carcinoma ([place])   10/27/2015 Imaging Liver MRI with and without contrast showed a 4 cm lesion in the segment 8 liver, image characteristics  favor HCC. Mild hepatomegaly, no other metastasis.   11/21/2015 Initial Diagnosis Hepatocellular carcinoma ([place])   12/01/2015 Initial Biopsy Liver core needle biopsy showed hepatocellular carcinoma, moderately differentiated.   01/19/2016 Procedure TACE to the right lobe liver cancer by Dr. Annamaria Boots    02/13/2016 Imaging Abdominal MRI showed slightly smaller segment 8 liver lesion with reduced amount of central enhancement. Morphological findings of cirrhosis with background hepatic steatosis. Borderline prominent portal hepatis lymph node 1.3 cm, stable.    HISTORY OF PRESENTING ILLNESS (11/21/2015):  Megan Horton 75 y.o. female is here because of recent abnormal scan finding of a liver lesion.   She started having intermittent epigastric pain,  nausea, vomiting and weight loss 1.5 months ago, and presented to the ER on 10/10/2015 at [facility] for worsening symptoms. A CT scan was performed and she was noted to have 4.5 cm intralobar hypodense lesion in right love of liver and splenomegaly at 14.5 cm. There was also noted dilation of her portal vein consistent with portal HTN. She was sent to [facility] ([place]) for further evaluation.She underwent endoscopy by Dr. Benson Norway on 10/10/2015,  which was unremarkable. Abdominal MRI was subsequently obtained on 10/27/2015, which showed a 4.0 x 4.1 cm lesion in segment 8 of right liver lobe, with imaging features hepatocellular carcinoma. 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  She still has epigastric pain after meal, good appetite, gained some weight back, no nausea, mild constipation. She denies hematochezia, change of bowel habits, or other symptoms. She lives alone and tolerates daily activities without much difficulty.  She denies prior history of liver disease, no significant alcohol drinking history, no blood transfusion. She had a screening colonoscopy on 05/23/2015 which showed 5 small polyps which were removed.  CURRENT THERAPY: observation   INTERIM HISTORY: [name]  Returns for follow up. She was seen by surgeon Dr. Barry Dienes and declined liver surgery. She was referred to interventional radiology and underwent TACE to the right lobe liver cancer on 01/19/2016. She tolerated procedure very well, and repeat his MRI of abdomen on 02/13/2016 showed good response.   She is doing well overall, she has had mild sinus infection for week ago. She has nasal congestion and low grade fever at night, no productive cough, she is going to see her PCP in 2 days. She denies any significant abdominal pain, bloating, nausea, or other symptoms. Her bowel movements normal.  MEDICAL HISTORY:  Past Medical History  Diagnosis Date  . Hypertension   . Peripheral vascular disease ([place])   . Seasonal allergies   . Diabetes mellitus ([place])   . Hypothyroidism   . GERD (gastroesophageal reflux disease)   . Arthritis   . Sleep apnea     CPAP, sleep study  3-4 years ago in [place], [place] Dr. Elbert Ewings   . Wears glasses   . Adhesive capsulitis of right shoulder   . Partial tear of rotator cuff(726.13) left  . Complication of anesthesia 5/13    did have some disorientation post cerv fusion due to low sats  .  Left knee DJD   . Seasonal allergies   . Urgency of urination   . Hyperlipemia   . Peripheral neuropathy (H[facility])     fingers     SURGICAL HISTORY: Past Surgical History  Procedure Laterality Date  . Cholecystectomy  2009  . Hemorrhoid surgery  2009  . Knee arthroscopy      Left x2  . Carpal tunnel release      bilat  . Cervical fusion    . Tonsillectomy    . Varicose vein surgery      Right  . Heel spur excision      bilat  . Nose surgery    . Anterior cervical decomp/discectomy fusion  03/10/2012    Procedure: ANTERIOR CERVICAL DECOMPRESSION/DISCECTOMY FUSION 1 LEVEL;  Surgeon: Eustace Moore, MD;  Location: [facility];  Service: Neurosurgery;  Laterality: N/A;  Cervical five-six anterior cervical decompression fusion with plating  . Eye surgery  2012    bilat cataract  . Shoulder arthroscopy  06/27/2012    Procedure: ARTHROSCOPY SHOULDER;  Surgeon: Lorn Junes, MD;  Location: [facility];  Service: Orthopedics;  Laterality: Right;  right shoulder arthroscopy debridement extensive, distal clavulectomy, with resect adhesions with maniplulation  . Colonoscopy w/ polypectomy    . Total knee arthroplasty Left 07/02/2013    Procedure: TOTAL KNEE ARTHROPLASTY- left;  Surgeon: Lorn Junes, MD;  Location: [facility];  Service: Orthopedics;  Laterality: Left;  . Colonoscopy with propofol N/A 05/23/2015    Procedure: COLONOSCOPY WITH PROPOFOL;  Surgeon: Carol Ada, MD;  Location: [facility];  Service: Endoscopy;  Laterality: N/A;  . Abdominal hysterectomy      complete  . Esophagogastroduodenoscopy (egd) with propofol N/A 10/10/2015    Procedure: ESOPHAGOGASTRODUODENOSCOPY (EGD) WITH PROPOFOL;  Surgeon: Carol Ada, MD;  Location: [facility];  Service: Endoscopy;  Laterality: N/A;    SOCIAL HISTORY: Social History   Social History  . Marital Status: Widowed    Spouse Name: N/A  . Number of Children: N/A  . Years of Education: N/A   Occupational History  .  retired    Social History Main Topics  . Smoking status: Former Smoker -- 0.50 packs/day for 35 years    Types: Cigarettes    Quit date: 12/26/1993  . Smokeless tobacco: Never Used  . Alcohol Use: Yes     Comment: very rarely wine, on holidays   . Drug Use: No  . Sexual Activity: Not on file   Other Topics Concern  . Not on file   Social History Narrative    FAMILY HISTORY: Family History  Problem Relation Age of Onset  . Heart disease Mother   . Heart disease Father   . Heart disease Brother   . Heart attack Father   . Heart attack Brother   . Heart attack Sister   . Hypertension Mother   . Hypertension Father   . Hypertension Sister   . Stroke Mother   . Stroke Brother     ALLERGIES:  is allergic to asa; adhesive; and tylenol.  MEDICATIONS:  Current Outpatient Prescriptions  Medication Sig Dispense Refill  . amLODipine (NORVASC) 5 MG tablet Take 5  mg by mouth every morning.     . cycloSPORINE (RESTASIS) 0.05 % ophthalmic emulsion Place 1 drop into both eyes 2 (two) times daily.    Marland Kitchen dexlansoprazole (DEXILANT) 60 MG capsule Take 60 mg by mouth every morning.     . docusate sodium (COLACE) 100 MG capsule Take 1 capsule (100 mg total) by mouth daily as needed for mild constipation. 10 capsule 0  . DULoxetine (CYMBALTA) 60 MG capsule Take 60 mg by mouth daily.     Marland Kitchen esomeprazole (NEXIUM) 40 MG capsule Take 40 mg by mouth at bedtime.    . fluticasone (FLONASE) 50 MCG/ACT nasal spray Place 1 spray into both nostrils 2 (two) times daily as needed for allergies or rhinitis.     . furosemide (LASIX) 20 MG tablet Take 20 mg by mouth as needed. Reported on 02/10/2016    . gabapentin (NEURONTIN) 300 MG capsule Take 300 mg by mouth at bedtime.     . hydrochlorothiazide (MICROZIDE) 12.5 MG capsule Take 12.5 mg by mouth every morning.     Marland Kitchen levothyroxine (SYNTHROID, LEVOTHROID) 112 MCG tablet Take 112 mcg by mouth daily.    Marland Kitchen lovastatin (MEVACOR) 40 MG tablet Take 40 mg by mouth at  bedtime.    . metFORMIN (GLUCOPHAGE) 500 MG tablet Take 500 mg by mouth daily with breakfast.    . Multiple Vitamins-Minerals (CENTRUM SILVER PO) Take 1 tablet by mouth every morning.     . Olopatadine HCl (PATANASE) 0.6 % SOLN Place 1 each into the nose daily as needed (for allergies).    . ranitidine (ZANTAC) 150 MG tablet Take 150 mg by mouth at bedtime. Reported on 11/21/2015    . telmisartan (MICARDIS) 80 MG tablet Take 80 mg by mouth every morning.     Marland Kitchen tetrahydrozoline (VISINE) 0.05 % ophthalmic solution Place 1 drop into both eyes 2 (two) times daily as needed (for allergies).     . ondansetron (ZOFRAN) 8 MG tablet Take 1 tablet (8 mg total) by mouth every 8 (eight) hours as needed for nausea or vomiting. (Patient not taking: Reported on 02/10/2016) 20 tablet 0  . oxyCODONE (OXY IR/ROXICODONE) 5 MG immediate release tablet Take 1 tablet (5 mg total) by mouth every 6 (six) hours as needed for moderate pain. (Patient not taking: Reported on 02/10/2016) 30 tablet 0   No current facility-administered medications for this visit.    REVIEW OF SYSTEMS:   Constitutional: Denies fevers, chills or abnormal night sweats Eyes: Denies blurriness of vision, double vision or watery eyes Ears, nose, mouth, throat, and face: Denies mucositis or sore throat Respiratory: Denies cough, dyspnea or wheezes Cardiovascular: Denies palpitation, chest discomfort or lower extremity swelling Gastrointestinal:  Denies nausea, heartburn or change in bowel habits Skin: Denies abnormal skin rashes Lymphatics: Denies new lymphadenopathy or easy bruising Neurological:Denies numbness, tingling or new weaknesses Behavioral/Psych: Mood is stable, no new changes  All other systems were reviewed with the patient and are negative.  PHYSICAL EXAMINATION: ECOG PERFORMANCE STATUS: 1 - Symptomatic but completely ambulatory  Filed Vitals:   04/12/16 1329  BP: 143/66  Pulse: 80  Temp: 99 F (37.2 C)  Resp: 18   Filed  Weights   04/12/16 1329  Weight: 233 lb 9.6 oz (105.96 kg)    GENERAL:alert, no distress and comfortable SKIN: skin color, texture, turgor are normal, no rashes or significant lesions EYES: normal, conjunctiva are pink and non-injected, sclera clear OROPHARYNX:no exudate, no erythema and lips, buccal mucosa, and tongue normal  NECK: supple, thyroid normal size, non-tender, without nodularity LYMPH:  no palpable lymphadenopathy in the cervical, axillary or inguinal LUNGS: clear to auscultation and percussion with normal breathing effort HEART: regular rate & rhythm and no murmurs and no lower extremity edema ABDOMEN:abdomen soft, non-tender and normal bowel sounds Musculoskeletal:no cyanosis of digits and no clubbing  PSYCH: alert & oriented x 3 with fluent speech NEURO: no focal motor/sensory deficits  LABORATORY DATA:  I have reviewed the data as listed CBC Latest Ref Rng 04/12/2016 01/20/2016 01/19/2016  WBC 3.9 - 10.3 10e3/uL 6.6 8.1 5.0  Hemoglobin 11.6 - 15.9 g/dL 12.9 13.0 12.2  Hematocrit 34.8 - 46.6 % 40.1 41.1 37.0  Platelets 145 - 400 10e3/uL 100(L) 123(L) 109(L)    Recent Labs  10/07/15 1438  01/19/16 0930 01/20/16 0522 04/12/16 1407  NA 143  < > 143 141 143  K 3.6  < > 3.7 3.7 3.8  CL 107  --  107 106  --   CO2 30  < > 26 25 28   GLUCOSE 109*  < > 140* 163* 163*  BUN 9  < > 16 12 8.2  CREATININE 0.64  < > 0.68 0.57 0.8  CALCIUM 9.2  < > 8.8* 8.8* 9.6  GFRNONAA >60  --  >60 >60  --   GFRAA >60  --  >60 >60  --   PROT 6.5  < > 6.9 6.6 7.4  ALBUMIN 3.8  < > 4.0 3.8 3.7  AST 26  < > 25 27 32  ALT 26  < > 28 30 42  ALKPHOS 105  < > 113 101 152*  BILITOT 0.7  < > 0.4 0.6 0.81  < > = values in this interval not displayed.   AFP from today is still pending   PATHOLOGY REPORT  Diagnosis 12/01/2015 Liver, needle/core biopsy, central right mass HEPATOCELLULAR CARCINOMA, MODERATELY DIFFERENTIATED. Microscopic Comment The biopsy shows diffuse malignant lesions which  composed of well and moderately differentiated neoplastic hepatocytes. The carcinoma stains focal positive for hepar-1, negative for ck7. Reticulin and cd 34 highlights the proliferation pattern. The morphology and immunostaining pattern support the above diagnosis. This case also reviewed by Dr. Saralyn Pilar and agree.   RADIOGRAPHIC STUDIES: I have personally reviewed the radiological images as listed and agreed with the findings in the report.  MRI abdomen with and without contrast 02/13/2016 IMPRESSION: 1. The segment 8 mass is slightly smaller than before and has a reduced amount of central enhancement compared to the prior exam. There still some thin marginal peripheral enhancement resembling a pseudocapsule of tumor, for example on series 503. 2. Morphologic findings of cirrhosis with background hepatic steatosis. 3. Borderline prominent porta hepatis lymph node at 1.3 cm, similar to prior.   ASSESSMENT & PLAN: 75 yo female with PMH of HTN, DM, arthritis, hypothyroidism, obesity, buthistory of liver disease or heavy drinking history, presented with epigastric pain, nausea, and weight loss. Abdominal CT and MRI showed a 4.1 cm mass in the right lobe of liver  1. Hepatocellular carcinoma of rght lobe, stage I  -I reviewed the CT and MRI findings with pt in details. The liver lesion is very concern for malignancy, especially hepatocellular carcinoma. Although she does not have history of liver disease, image also showed portal hypertension and a mild liver cirrhosis, which is her high risk factor for [place].  -I reviewed her liver mass biopsy result, which confirmed [place].  -her APF is significantly elevated, CA19.9 normal, consistent with HCC  -  CT chest was negative for metastasis. This was discussed with patient -She is status post TACE by interventional radiology Dr. Annamaria Boots, posttreatment MRI showed good partial response. She will continue follow-up with Dr. Annamaria Boots  -We discussed cancer  surveillance, she will follow up with Dr. Annamaria Boots with repeated images. She will also follow-up with Dr. Benson Norway for her liver disease   2. Liver steatosis and early liver cirrhosis  -Her liver MRI showed mild hepatomegaly, steatosis and probable early cirrhosis -Her liver function is normal, no history of liver disease. -She will continue follow-up with Dr. Benson Norway   3. HTN, DM, hypothyroidism -She'll continue follow-up with her primary care physician  Plan -lab today CBC, CMP and AFP -she will follow up with Dr. Annamaria Boots for her [facility], and I will see her as needed in future.   All questions were answered. The patient knows to call the clinic with any problems, questions or concerns.  I spent 25 minutes counseling the patient face to face. The total time spent in the appointment was 30 minutes and more than 50% was on counseling.     Truitt Merle, MD 04/12/2016

## 2016-04-13 LAB — AFP TUMOR MARKER: AFP, SERUM, TUMOR MARKER: 100.5 ng/mL — AB (ref 0.0–8.3)

## 2016-04-14 DIAGNOSIS — R05 Cough: Secondary | ICD-10-CM | POA: Diagnosis not present

## 2016-04-14 DIAGNOSIS — J209 Acute bronchitis, unspecified: Secondary | ICD-10-CM | POA: Diagnosis not present

## 2016-04-27 DIAGNOSIS — M5126 Other intervertebral disc displacement, lumbar region: Secondary | ICD-10-CM | POA: Diagnosis not present

## 2016-04-27 DIAGNOSIS — Z6838 Body mass index (BMI) 38.0-38.9, adult: Secondary | ICD-10-CM | POA: Diagnosis not present

## 2016-04-28 ENCOUNTER — Other Ambulatory Visit (HOSPITAL_COMMUNITY): Payer: Self-pay | Admitting: Interventional Radiology

## 2016-04-28 DIAGNOSIS — C220 Liver cell carcinoma: Secondary | ICD-10-CM

## 2016-04-29 DIAGNOSIS — M5136 Other intervertebral disc degeneration, lumbar region: Secondary | ICD-10-CM | POA: Diagnosis not present

## 2016-04-29 DIAGNOSIS — M5126 Other intervertebral disc displacement, lumbar region: Secondary | ICD-10-CM | POA: Diagnosis not present

## 2016-05-17 DIAGNOSIS — M5126 Other intervertebral disc displacement, lumbar region: Secondary | ICD-10-CM | POA: Diagnosis not present

## 2016-05-18 ENCOUNTER — Ambulatory Visit
Admission: RE | Admit: 2016-05-18 | Discharge: 2016-05-18 | Disposition: A | Discharge status: Home / Self Care | Payer: Medicare Other | Source: Ambulatory Visit | Attending: Interventional Radiology | Admitting: Interventional Radiology

## 2016-05-18 ENCOUNTER — Ambulatory Visit (HOSPITAL_COMMUNITY)
Admission: RE | Admit: 2016-05-18 | Discharge: 2016-05-18 | Disposition: A | Discharge status: Home / Self Care | Payer: Medicare Other | Source: Ambulatory Visit | Attending: Interventional Radiology | Admitting: Interventional Radiology

## 2016-05-18 DIAGNOSIS — K766 Portal hypertension: Secondary | ICD-10-CM | POA: Diagnosis not present

## 2016-05-18 DIAGNOSIS — R161 Splenomegaly, not elsewhere classified: Secondary | ICD-10-CM | POA: Diagnosis not present

## 2016-05-18 DIAGNOSIS — K7460 Unspecified cirrhosis of liver: Secondary | ICD-10-CM | POA: Diagnosis not present

## 2016-05-18 DIAGNOSIS — C220 Liver cell carcinoma: Secondary | ICD-10-CM

## 2016-05-18 DIAGNOSIS — R590 Localized enlarged lymph nodes: Secondary | ICD-10-CM | POA: Insufficient documentation

## 2016-05-18 DIAGNOSIS — C229 Malignant neoplasm of liver, not specified as primary or secondary: Secondary | ICD-10-CM | POA: Diagnosis not present

## 2016-05-18 HISTORY — PX: IR GENERIC HISTORICAL: IMG1180011

## 2016-05-18 MED ORDER — GADOXETATE DISODIUM 0.25 MMOL/ML IV SOLN
10.0000 mL | Freq: Once | INTRAVENOUS | Status: AC | PRN
  Administered 2016-05-18: 10 mL via INTRAVENOUS

## 2016-05-18 NOTE — Progress Notes (Signed)
Patient ID: Megan Horton, female   DOB: Apr 25, 1941, 75 y.o.   MRN: [id number]       Chief Complaint:  Moderately differentiated hepatocellular carcinoma, 4 months status post DEB TACE.   Referring Physician(s): Burr Medico  History of Present Illness: Megan Horton is a 75 y.o. female with biopsy-proven moderately differentiated right hepatocellular carcinoma. Lesion is within segment 8. Initial lesion size was 4.5 cm. Patient is now 4 months status post DEB TACE to the right hepatic mass. 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 No current flank or abdominal pain. No signs of jaundice. Overall she is at her baseline. Excellent functional status. No fevers. She returns to review surveillance imaging.  Past Medical History  Diagnosis Date  . Hypertension   . Peripheral vascular disease ([place])   . Seasonal allergies   . Diabetes mellitus ([place])   . Hypothyroidism   . GERD (gastroesophageal reflux disease)   . Arthritis   . Sleep apnea     CPAP, sleep study 3-4 years ago in [place], [place] Dr. Elbert Ewings   . Wears glasses   . Adhesive capsulitis of right shoulder   . Partial tear of rotator cuff(726.13) left  . Complication of anesthesia 5/13    did have some disorientation post cerv fusion due to low sats  . Left knee DJD   . Seasonal allergies   . Urgency of urination   . Hyperlipemia   . Peripheral neuropathy ([facility])     fingers     Past Surgical History  Procedure Laterality Date  . Cholecystectomy  2009  . Hemorrhoid surgery  2009  . Knee arthroscopy      Left x2  . Carpal tunnel release      bilat  . Cervical fusion    . Tonsillectomy    . Varicose vein surgery      Right  . Heel spur excision      bilat  . Nose surgery    . Anterior cervical decomp/discectomy fusion  03/10/2012    Procedure: ANTERIOR CERVICAL DECOMPRESSION/DISCECTOMY FUSION 1 LEVEL;  Surgeon: Eustace Moore, MD;  Location: [facility];  Service: Neurosurgery;  Laterality: N/A;  Cervical  five-six anterior cervical decompression fusion with plating  . Eye surgery  2012    bilat cataract  . Shoulder arthroscopy  06/27/2012    Procedure: ARTHROSCOPY SHOULDER;  Surgeon: Lorn Junes, MD;  Location: [facility];  Service: Orthopedics;  Laterality: Right;  right shoulder arthroscopy debridement extensive, distal clavulectomy, with resect adhesions with maniplulation  . Colonoscopy w/ polypectomy    . Total knee arthroplasty Left 07/02/2013    Procedure: TOTAL KNEE ARTHROPLASTY- left;  Surgeon: Lorn Junes, MD;  Location: [facility];  Service: Orthopedics;  Laterality: Left;  . Colonoscopy with propofol N/A 05/23/2015    Procedure: COLONOSCOPY WITH PROPOFOL;  Surgeon: Carol Ada, MD;  Location: [facility];  Service: Endoscopy;  Laterality: N/A;  . Abdominal hysterectomy      complete  . Esophagogastroduodenoscopy (egd) with propofol N/A 10/10/2015    Procedure: ESOPHAGOGASTRODUODENOSCOPY (EGD) WITH PROPOFOL;  Surgeon: Carol Ada, MD;  Location: [facility];  Service: Endoscopy;  Laterality: N/A;    Allergies: Asa; Adhesive; and Tylenol  Medications: Prior to Admission medications   Medication Sig Start Date End Date Taking? Authorizing Provider  amLODipine (NORVASC) 5 MG tablet Take 5 mg by mouth every morning.     Historical Provider, MD  cycloSPORINE (RESTASIS) 0.05 % ophthalmic emulsion Place 1 drop into  both eyes 2 (two) times daily.    Historical Provider, MD  dexlansoprazole (DEXILANT) 60 MG capsule Take 60 mg by mouth every morning.     Historical Provider, MD  docusate sodium (COLACE) 100 MG capsule Take 1 capsule (100 mg total) by mouth daily as needed for mild constipation. 01/20/16   Saverio Danker, PA-C  DULoxetine (CYMBALTA) 60 MG capsule Take 60 mg by mouth daily.     Historical Provider, MD  esomeprazole (NEXIUM) 40 MG capsule Take 40 mg by mouth at bedtime.    Historical Provider, MD  fluticasone (FLONASE) 50 MCG/ACT nasal spray Place 1 spray  into both nostrils 2 (two) times daily as needed for allergies or rhinitis.     Historical Provider, MD  furosemide (LASIX) 20 MG tablet Take 20 mg by mouth as needed. Reported on 02/10/2016 10/20/15   Historical Provider, MD  gabapentin (NEURONTIN) 300 MG capsule Take 300 mg by mouth at bedtime.     Historical Provider, MD  hydrochlorothiazide (MICROZIDE) 12.5 MG capsule Take 12.5 mg by mouth every morning.     Historical Provider, MD  levothyroxine (SYNTHROID, LEVOTHROID) 112 MCG tablet Take 112 mcg by mouth daily. 12/22/15   Historical Provider, MD  lovastatin (MEVACOR) 40 MG tablet Take 40 mg by mouth at bedtime.    Historical Provider, MD  metFORMIN (GLUCOPHAGE) 500 MG tablet Take 500 mg by mouth daily with breakfast.    Historical Provider, MD  Multiple Vitamins-Minerals (CENTRUM SILVER PO) Take 1 tablet by mouth every morning.     Historical Provider, MD  Olopatadine HCl (PATANASE) 0.6 % SOLN Place 1 each into the nose daily as needed (for allergies).    Historical Provider, MD  ondansetron (ZOFRAN) 8 MG tablet Take 1 tablet (8 mg total) by mouth every 8 (eight) hours as needed for nausea or vomiting. Patient not taking: Reported on 02/10/2016 01/20/16   Saverio Danker, PA-C  oxyCODONE (OXY IR/ROXICODONE) 5 MG immediate release tablet Take 1 tablet (5 mg total) by mouth every 6 (six) hours as needed for moderate pain. Patient not taking: Reported on 02/10/2016 01/20/16   Saverio Danker, PA-C  ranitidine (ZANTAC) 150 MG tablet Take 150 mg by mouth at bedtime. Reported on 11/21/2015    Historical Provider, MD  telmisartan (MICARDIS) 80 MG tablet Take 80 mg by mouth every morning.     Historical Provider, MD  tetrahydrozoline (VISINE) 0.05 % ophthalmic solution Place 1 drop into both eyes 2 (two) times daily as needed (for allergies).     Historical Provider, MD     Family History  Problem Relation Age of Onset  . Heart disease Mother   . Heart disease Father   . Heart disease Brother   . Heart attack  Father   . Heart attack Brother   . Heart attack Sister   . Hypertension Mother   . Hypertension Father   . Hypertension Sister   . Stroke Mother   . Stroke Brother     Social History   Social History  . Marital Status: Widowed    Spouse Name: N/A  . Number of Children: N/A  . Years of Education: N/A   Occupational History  . retired    Social History Main Topics  . Smoking status: Former Smoker -- 0.50 packs/day for 35 years    Types: Cigarettes    Quit date: 12/26/1993  . Smokeless tobacco: Never Used  . Alcohol Use: Yes     Comment: very rarely wine, on holidays   .  Drug Use: No  . Sexual Activity: Not on file   Other Topics Concern  . Not on file   Social History Narrative    ECOG Status: 1 - Symptomatic but completely ambulatory  Review of Systems: A 12 point ROS discussed and pertinent positives are indicated in the HPI above.  All other systems are negative.  Review of Systems  Vital Signs: BP 158/86 mmHg  Pulse 63  Temp(Src) 98.7 F (37.1 C) (Oral)  Resp 14  Ht 5\' 4"  (1.626 m)  Wt 230 lb (104.327 kg)  BMI 39.46 kg/m2  SpO2 97%  Physical Exam  Constitutional: She is oriented to person, place, and time. She appears well-developed and well-nourished. No distress.  Pleasant elderly female, moderately obese, no distress.  Eyes: No scleral icterus.  Cardiovascular: Normal rate and regular rhythm.   No murmur heard. Pulmonary/Chest: Effort normal and breath sounds normal. No respiratory distress.  Abdominal: Soft. Bowel sounds are normal. She exhibits no distension and no mass. There is no tenderness. No hernia.  No palpable mass. Liver is not felt below the costal margin.  Musculoskeletal: She exhibits no edema or tenderness.  Neurological: She is alert and oriented to person, place, and time.  Skin: Skin is warm and dry. No rash noted. She is not diaphoretic. No erythema. No pallor.  No signs of jaundice.  Psychiatric: She has a normal mood and  affect. Her behavior is normal.    Mallampati Score:   2  Imaging: Repeat surveillance MRI of the abdomen with contrast today. The segment 8 lesion is grossly stable in size. Maximal diameter 4.5 cm. There is return of some mild septated internal lesion enhancement compared to 02/13/2016. No definite new or additional hepatic disease. Mild heterogeneous enhancing pattern in the right hepatic lobe presumed secondary to the previous depth TACE. No biliary dilatation. Patent portal vein. No extra hepatic disease.  Labs:  CBC:  Recent Labs  12/05/15 1219 01/19/16 0930 01/20/16 0522 04/12/16 1407  WBC 4.7 5.0 8.1 6.6  HGB 12.8 12.2 13.0 12.9  HCT 39.9 37.0 41.1 40.1  PLT 105* 109* 123* 100*    COAGS:  Recent Labs  11/21/15 1259 01/19/16 0930  INR 1.0 0.94  APTT  --  30    BMP:  Recent Labs  10/07/15 1438  12/05/15 1219 01/19/16 0930 01/20/16 0522 04/12/16 1407  NA 143  < > 146* 143 141 143  K 3.6  < > 4.0 3.7 3.7 3.8  CL 107  --   --  107 106  --   CO2 30  < > 28 26 25 28   GLUCOSE 109*  < > 82 140* 163* 163*  BUN 9  < > 12.1 16 12  8.2  CALCIUM 9.2  < > 9.4 8.8* 8.8* 9.6  CREATININE 0.64  < > 0.8 0.68 0.57 0.8  GFRNONAA >60  --   --  >60 >60  --   GFRAA >60  --   --  >60 >60  --   < > = values in this interval not displayed.  LIVER FUNCTION TESTS:  Recent Labs  12/05/15 1219 01/19/16 0930 01/20/16 0522 04/12/16 1407  BILITOT 0.60 0.4 0.6 0.81  AST 21 25 27  32  ALT 22 28 30  42  ALKPHOS 120 113 101 152*  PROT 6.9 6.9 6.6 7.4  ALBUMIN 3.7 4.0 3.8 3.7     Assessment and Plan:  4 months status post right hepatic artery DEB TACE for a moderately differentiated  hepatocellular carcinoma within segment 8. Stable lesion size. Interval return of some internal mild enhancement compared to April 2017. No new disease evident.  Plan: Recommend repeat DEB TACE in the next few weeks. This will be scheduled at [facility]. Patient is in agreement with  this.  Thank you for this interesting consult.  I greatly enjoyed meeting Alvie Nehmer and look forward to participating in their care.  A copy of this report was sent to the requesting provider on this date.  Electronically Signed: Greggory Keen 05/18/2016, 2:50 PM   I spent a total of    25 Minutes in face to face in clinical consultation, greater than 50% of which was counseling/coordinating care for this patient with moderately differentiated central hepatocellular carcinoma.

## 2016-05-19 ENCOUNTER — Other Ambulatory Visit: Payer: Self-pay | Admitting: Interventional Radiology

## 2016-05-19 DIAGNOSIS — C220 Liver cell carcinoma: Secondary | ICD-10-CM

## 2016-05-27 MED ORDER — LC BEADS 100-300UM IN SALINE
150.0000 mg | Freq: Once | Status: AC
  Administered 2016-05-31: 75 mg via INTRA_ARTERIAL
  Filled 2016-05-27: qty 75

## 2016-05-28 ENCOUNTER — Other Ambulatory Visit: Payer: Self-pay | Admitting: General Surgery

## 2016-05-28 ENCOUNTER — Other Ambulatory Visit: Payer: Self-pay | Admitting: Radiology

## 2016-05-31 ENCOUNTER — Other Ambulatory Visit: Payer: Self-pay | Admitting: Interventional Radiology

## 2016-05-31 ENCOUNTER — Ambulatory Visit (HOSPITAL_COMMUNITY)
Admission: RE | Admit: 2016-05-31 | Discharge: 2016-05-31 | Disposition: A | Discharge status: Home / Self Care | Payer: Medicare Other | Source: Ambulatory Visit | Attending: Interventional Radiology | Admitting: Interventional Radiology

## 2016-05-31 ENCOUNTER — Encounter (HOSPITAL_COMMUNITY): Payer: Self-pay

## 2016-05-31 ENCOUNTER — Observation Stay (HOSPITAL_COMMUNITY)
Admission: RE | Admit: 2016-05-31 | Discharge: 2016-06-01 | Disposition: A | Discharge status: Home / Self Care | Payer: Medicare Other | Source: Ambulatory Visit | Attending: Interventional Radiology | Admitting: Interventional Radiology

## 2016-05-31 DIAGNOSIS — Z96652 Presence of left artificial knee joint: Secondary | ICD-10-CM | POA: Insufficient documentation

## 2016-05-31 DIAGNOSIS — C220 Liver cell carcinoma: Principal | ICD-10-CM | POA: Insufficient documentation

## 2016-05-31 DIAGNOSIS — E119 Type 2 diabetes mellitus without complications: Secondary | ICD-10-CM | POA: Diagnosis not present

## 2016-05-31 DIAGNOSIS — E039 Hypothyroidism, unspecified: Secondary | ICD-10-CM | POA: Insufficient documentation

## 2016-05-31 DIAGNOSIS — I10 Essential (primary) hypertension: Secondary | ICD-10-CM | POA: Insufficient documentation

## 2016-05-31 DIAGNOSIS — E785 Hyperlipidemia, unspecified: Secondary | ICD-10-CM | POA: Insufficient documentation

## 2016-05-31 DIAGNOSIS — M1990 Unspecified osteoarthritis, unspecified site: Secondary | ICD-10-CM | POA: Diagnosis not present

## 2016-05-31 DIAGNOSIS — Z8679 Personal history of other diseases of the circulatory system: Secondary | ICD-10-CM | POA: Insufficient documentation

## 2016-05-31 DIAGNOSIS — J029 Acute pharyngitis, unspecified: Secondary | ICD-10-CM | POA: Diagnosis present

## 2016-05-31 DIAGNOSIS — Z79899 Other long term (current) drug therapy: Secondary | ICD-10-CM | POA: Insufficient documentation

## 2016-05-31 DIAGNOSIS — Z7984 Long term (current) use of oral hypoglycemic drugs: Secondary | ICD-10-CM | POA: Diagnosis not present

## 2016-05-31 HISTORY — PX: IR GENERIC HISTORICAL: IMG1180011

## 2016-05-31 LAB — COMPREHENSIVE METABOLIC PANEL
ALBUMIN: 3.6 g/dL (ref 3.5–5.0)
ALK PHOS: 142 U/L — AB (ref 38–126)
ALT: 60 U/L — AB (ref 14–54)
ANION GAP: 8 (ref 5–15)
AST: 54 U/L — ABNORMAL HIGH (ref 15–41)
BILIRUBIN TOTAL: 1 mg/dL (ref 0.3–1.2)
BUN: 16 mg/dL (ref 6–20)
CALCIUM: 8.5 mg/dL — AB (ref 8.9–10.3)
CO2: 26 mmol/L (ref 22–32)
CREATININE: 0.8 mg/dL (ref 0.44–1.00)
Chloride: 106 mmol/L (ref 101–111)
GFR calc Af Amer: >60 mL/min (ref >60)
GFR calc non Af Amer: >60 mL/min (ref >60)
GLUCOSE: 211 mg/dL — AB (ref 65–99)
Potassium: 4 mmol/L (ref 3.5–5.1)
Sodium: 140 mmol/L (ref 135–145)
TOTAL PROTEIN: 6.4 g/dL — AB (ref 6.5–8.1)

## 2016-05-31 LAB — CBC WITH DIFFERENTIAL/PLATELET
BASOS PCT: 1 %
Basophils Absolute: 0 10*3/uL (ref 0.0–0.1)
Eosinophils Absolute: 0.3 10*3/uL (ref 0.0–0.7)
Eosinophils Relative: 6 %
HEMATOCRIT: 38.2 % (ref 36.0–46.0)
HEMOGLOBIN: 11.8 g/dL — AB (ref 12.0–15.0)
LYMPHS ABS: 1 10*3/uL (ref 0.7–4.0)
Lymphocytes Relative: 25 %
MCH: 28.6 pg (ref 26.0–34.0)
MCHC: 30.9 g/dL (ref 30.0–36.0)
MCV: 92.5 fL (ref 78.0–100.0)
MONOS PCT: 12 %
Monocytes Absolute: 0.5 10*3/uL (ref 0.1–1.0)
NEUTROS ABS: 2.3 10*3/uL (ref 1.7–7.7)
NEUTROS PCT: 57 %
Platelets: 90 10*3/uL — ABNORMAL LOW (ref 150–400)
RBC: 4.13 MIL/uL (ref 3.87–5.11)
RDW: 14.8 % (ref 11.5–15.5)
WBC: 4.1 10*3/uL (ref 4.0–10.5)

## 2016-05-31 LAB — PROTIME-INR
INR: 1.05 (ref 0.00–1.49)
Prothrombin Time: 13.9 seconds (ref 11.6–15.2)

## 2016-05-31 LAB — APTT: aPTT: 31 seconds (ref 24–37)

## 2016-05-31 LAB — GLUCOSE, CAPILLARY: GLUCOSE-CAPILLARY: 222 mg/dL — AB (ref 65–99)

## 2016-05-31 MED ORDER — NAPHAZOLINE-GLYCERIN 0.012-0.2 % OP SOLN
1.0000 [drp] | Freq: Two times a day (BID) | OPHTHALMIC | Status: DC
  Administered 2016-05-31 – 2016-06-01 (×2): 1 [drp] via OPHTHALMIC
  Filled 2016-05-31: qty 15

## 2016-05-31 MED ORDER — NITROGLYCERIN 1 MG/10 ML FOR IR/CATH LAB
INTRA_ARTERIAL | Status: AC | PRN
  Administered 2016-05-31: 200 ug via INTRA_ARTERIAL

## 2016-05-31 MED ORDER — IRBESARTAN 150 MG PO TABS
300.0000 mg | ORAL_TABLET | Freq: Every day | ORAL | Status: DC
  Administered 2016-06-01: 300 mg via ORAL
  Filled 2016-05-31: qty 2

## 2016-05-31 MED ORDER — FUROSEMIDE 20 MG PO TABS: 20.0000 mg | ORAL_TABLET | Freq: Every day | ORAL | Status: DC | PRN

## 2016-05-31 MED ORDER — ONDANSETRON HCL 4 MG/2ML IJ SOLN: 4.0000 mg | Freq: Four times a day (QID) | INTRAMUSCULAR | Status: DC | PRN

## 2016-05-31 MED ORDER — LIDOCAINE HCL 1 % IJ SOLN
INTRAMUSCULAR | Status: AC | PRN
  Administered 2016-05-31: 5 mL

## 2016-05-31 MED ORDER — DEXAMETHASONE SODIUM PHOSPHATE 10 MG/ML IJ SOLN
INTRAMUSCULAR | Status: AC
  Administered 2016-05-31: 10 mg via INTRAVENOUS
  Filled 2016-05-31: qty 1

## 2016-05-31 MED ORDER — DEXAMETHASONE SODIUM PHOSPHATE 10 MG/ML IJ SOLN
10.0000 mg | Freq: Once | INTRAMUSCULAR | Status: AC
  Administered 2016-05-31: 10 mg via INTRAVENOUS

## 2016-05-31 MED ORDER — PRAVASTATIN SODIUM 20 MG PO TABS
40.0000 mg | ORAL_TABLET | Freq: Every day | ORAL | Status: DC
  Administered 2016-05-31: 40 mg via ORAL
  Filled 2016-05-31 (×2): qty 2

## 2016-05-31 MED ORDER — DULOXETINE HCL 60 MG PO CPEP
60.0000 mg | ORAL_CAPSULE | Freq: Every evening | ORAL | Status: DC
  Filled 2016-05-31: qty 1

## 2016-05-31 MED ORDER — FLUTICASONE PROPIONATE 50 MCG/ACT NA SUSP
1.0000 | Freq: Two times a day (BID) | NASAL | Status: DC | PRN
  Filled 2016-05-31: qty 16

## 2016-05-31 MED ORDER — OLOPATADINE HCL 0.6 % NA SOLN: 1.0000 | Freq: Every day | NASAL | Status: DC | PRN

## 2016-05-31 MED ORDER — LIDOCAINE HCL 1 % IJ SOLN
INTRAMUSCULAR | Status: AC
  Filled 2016-05-31: qty 20

## 2016-05-31 MED ORDER — PIPERACILLIN-TAZOBACTAM 3.375 G IVPB
3.3750 g | INTRAVENOUS | Status: AC
  Administered 2016-05-31: 3.375 g via INTRAVENOUS
  Filled 2016-05-31: qty 50

## 2016-05-31 MED ORDER — PANTOPRAZOLE SODIUM 40 MG PO TBEC
40.0000 mg | DELAYED_RELEASE_TABLET | Freq: Every day | ORAL | Status: DC
  Administered 2016-05-31 – 2016-06-01 (×2): 40 mg via ORAL
  Filled 2016-05-31 (×2): qty 1

## 2016-05-31 MED ORDER — FENTANYL CITRATE (PF) 100 MCG/2ML IJ SOLN
INTRAMUSCULAR | Status: AC
  Filled 2016-05-31: qty 4

## 2016-05-31 MED ORDER — DEXAMETHASONE SODIUM PHOSPHATE 10 MG/ML IJ SOLN
INTRAMUSCULAR | Status: AC
  Filled 2016-05-31: qty 1

## 2016-05-31 MED ORDER — LEVOTHYROXINE SODIUM 112 MCG PO TABS
112.0000 ug | ORAL_TABLET | Freq: Every day | ORAL | Status: DC
  Administered 2016-06-01: 112 ug via ORAL
  Filled 2016-05-31: qty 1

## 2016-05-31 MED ORDER — FENTANYL CITRATE (PF) 100 MCG/2ML IJ SOLN
INTRAMUSCULAR | Status: AC | PRN
  Administered 2016-05-31: 50 ug via INTRAVENOUS
  Administered 2016-05-31 (×2): 25 ug via INTRAVENOUS

## 2016-05-31 MED ORDER — IOPAMIDOL (ISOVUE-300) INJECTION 61%
100.0000 mL | Freq: Once | INTRAVENOUS | Status: AC | PRN
  Administered 2016-05-31: 132 mL via INTRA_ARTERIAL

## 2016-05-31 MED ORDER — AMLODIPINE BESYLATE 5 MG PO TABS
5.0000 mg | ORAL_TABLET | Freq: Every morning | ORAL | Status: DC
  Administered 2016-06-01: 5 mg via ORAL
  Filled 2016-05-31: qty 1

## 2016-05-31 MED ORDER — DOCUSATE SODIUM 100 MG PO CAPS: 100.0000 mg | ORAL_CAPSULE | Freq: Every day | ORAL | Status: DC | PRN

## 2016-05-31 MED ORDER — MIDAZOLAM HCL 2 MG/2ML IJ SOLN
INTRAMUSCULAR | Status: AC | PRN
  Administered 2016-05-31: 1 mg via INTRAVENOUS
  Administered 2016-05-31: 0.5 mg via INTRAVENOUS
  Administered 2016-05-31: 1 mg via INTRAVENOUS
  Administered 2016-05-31 (×3): 0.5 mg via INTRAVENOUS

## 2016-05-31 MED ORDER — CYCLOSPORINE 0.05 % OP EMUL
1.0000 [drp] | Freq: Two times a day (BID) | OPHTHALMIC | Status: DC
  Administered 2016-05-31 – 2016-06-01 (×2): 1 [drp] via OPHTHALMIC
  Filled 2016-05-31 (×2): qty 1

## 2016-05-31 MED ORDER — MIDAZOLAM HCL 2 MG/2ML IJ SOLN
INTRAMUSCULAR | Status: AC
  Filled 2016-05-31: qty 6

## 2016-05-31 MED ORDER — SODIUM CHLORIDE 0.9 % IV SOLN
8.0000 mg | Freq: Once | INTRAVENOUS | Status: AC
  Administered 2016-05-31: 8 mg via INTRAVENOUS
  Filled 2016-05-31: qty 4

## 2016-05-31 MED ORDER — SODIUM CHLORIDE 0.9 % IV SOLN
INTRAVENOUS | Status: DC
  Administered 2016-05-31: 16:00:00 via INTRAVENOUS

## 2016-05-31 MED ORDER — HYDROCHLOROTHIAZIDE 12.5 MG PO CAPS
12.5000 mg | ORAL_CAPSULE | Freq: Every morning | ORAL | Status: DC
  Administered 2016-06-01: 12.5 mg via ORAL
  Filled 2016-05-31: qty 1

## 2016-05-31 MED ORDER — SODIUM CHLORIDE 0.9 % IV SOLN
Freq: Once | INTRAVENOUS | Status: AC
  Administered 2016-05-31: 10:00:00 via INTRAVENOUS

## 2016-05-31 MED ORDER — GABAPENTIN 300 MG PO CAPS
300.0000 mg | ORAL_CAPSULE | Freq: Every day | ORAL | Status: DC
  Administered 2016-05-31: 300 mg via ORAL
  Filled 2016-05-31: qty 1

## 2016-05-31 NOTE — Procedures (Signed)
S/p RT HEPATIC ARTERY DEB TACE(2ND TREATMENT) NO COMP STABLE FULL REPORT IN PACS OVERNIGHT OBS RECOVERY

## 2016-05-31 NOTE — Progress Notes (Signed)
Patient ID: Megan Horton, female   DOB: 07-26-1941, 75 y.o.   MRN: [id number]    Referring Physician(s): Feng,Y  Supervising Physician: Daryll Brod  Patient Status:  Outpatient TBA  Chief Complaint:  Hepatocellular carcinoma  Subjective: Pt familiar to IR service from segment 8 [facility] DEB- TACE in March 2017. Recent follow-up imaging has revealed stable lesion size but interval return of some internal mild enhancement compared to April 2017. She presents again today for repeat hepatic DEB-TACE. She currently denies fever, headache, chest pain, dyspnea, abdominal pain, nausea, vomiting or abnormal bleeding. She does have some sinus drainage, occasional sore throat, as well as intermittent back pain.   Allergies: Asa [aspirin]; Adhesive [tape]; and Tylenol [acetaminophen]  Medications: Prior to Admission medications   Medication Sig Start Date End Date Taking? Authorizing Provider  amLODipine (NORVASC) 5 MG tablet Take 5 mg by mouth every morning.     Historical Provider, MD  cycloSPORINE (RESTASIS) 0.05 % ophthalmic emulsion Place 1 drop into both eyes 2 (two) times daily.    Historical Provider, MD  dexlansoprazole (DEXILANT) 60 MG capsule Take 60 mg by mouth every morning.     Historical Provider, MD  docusate sodium (COLACE) 100 MG capsule Take 1 capsule (100 mg total) by mouth daily as needed for mild constipation. 01/20/16   Saverio Danker, PA-C  DULoxetine (CYMBALTA) 60 MG capsule Take 60 mg by mouth daily.     Historical Provider, MD  esomeprazole (NEXIUM) 40 MG capsule Take 40 mg by mouth at bedtime.    Historical Provider, MD  fluticasone (FLONASE) 50 MCG/ACT nasal spray Place 1 spray into both nostrils 2 (two) times daily as needed for allergies or rhinitis.     Historical Provider, MD  furosemide (LASIX) 20 MG tablet Take 20 mg by mouth as needed. Reported on 02/10/2016 10/20/15   Historical Provider, MD  gabapentin (NEURONTIN) 300 MG capsule Take 300 mg by mouth at bedtime.      Historical Provider, MD  hydrochlorothiazide (MICROZIDE) 12.5 MG capsule Take 12.5 mg by mouth every morning.     Historical Provider, MD  levothyroxine (SYNTHROID, LEVOTHROID) 112 MCG tablet Take 112 mcg by mouth daily. 12/22/15   Historical Provider, MD  lovastatin (MEVACOR) 40 MG tablet Take 40 mg by mouth at bedtime.    Historical Provider, MD  metFORMIN (GLUCOPHAGE) 500 MG tablet Take 500 mg by mouth daily with breakfast.    Historical Provider, MD  Multiple Vitamins-Minerals (CENTRUM SILVER PO) Take 1 tablet by mouth every morning.     Historical Provider, MD  Olopatadine HCl (PATANASE) 0.6 % SOLN Place 1 each into the nose daily as needed (for allergies).    Historical Provider, MD  ondansetron (ZOFRAN) 8 MG tablet Take 1 tablet (8 mg total) by mouth every 8 (eight) hours as needed for nausea or vomiting. Patient not taking: Reported on 02/10/2016 01/20/16   Saverio Danker, PA-C  oxyCODONE (OXY IR/ROXICODONE) 5 MG immediate release tablet Take 1 tablet (5 mg total) by mouth every 6 (six) hours as needed for moderate pain. Patient not taking: Reported on 02/10/2016 01/20/16   Saverio Danker, PA-C  ranitidine (ZANTAC) 150 MG tablet Take 150 mg by mouth at bedtime. Reported on 11/21/2015    Historical Provider, MD  telmisartan (MICARDIS) 80 MG tablet Take 80 mg by mouth every morning.     Historical Provider, MD  tetrahydrozoline (VISINE) 0.05 % ophthalmic solution Place 1 drop into both eyes 2 (two) times daily as needed (for allergies).  Historical Provider, MD     Vital Signs: BP 103/62 (BP Location: Right Arm)   Pulse (!) 56   Temp 98.2 F (36.8 C) (Oral)   Resp 16   SpO2 97%   Physical Exam patient awake, alert. Chest clear to auscultation bilaterally. Heart with slightly bradycardic but regular rhythm. Abdomen obese, soft, positive bowel sounds, nontender. Lower extremities with bilateral 1+ edema.  Imaging: No results found.  Labs:  CBC:  Recent Labs  12/05/15 1219  01/19/16 0930 01/20/16 0522 04/12/16 1407  WBC 4.7 5.0 8.1 6.6  HGB 12.8 12.2 13.0 12.9  HCT 39.9 37.0 41.1 40.1  PLT 105* 109* 123* 100*    COAGS:  Recent Labs  11/21/15 1259 01/19/16 0930  INR 1.0 0.94  APTT  --  30    BMP:  Recent Labs  10/07/15 1438  12/05/15 1219 01/19/16 0930 01/20/16 0522 04/12/16 1407  NA 143  < > 146* 143 141 143  K 3.6  < > 4.0 3.7 3.7 3.8  CL 107  --   --  107 106  --   CO2 30  < > 28 26 25 28   GLUCOSE 109*  < > 82 140* 163* 163*  BUN 9  < > 12.1 16 12  8.2  CALCIUM 9.2  < > 9.4 8.8* 8.8* 9.6  CREATININE 0.64  < > 0.8 0.68 0.57 0.8  GFRNONAA >60  --   --  >60 >60  --   GFRAA >60  --   --  >60 >60  --   < > = values in this interval not displayed.  LIVER FUNCTION TESTS:  Recent Labs  12/05/15 1219 01/19/16 0930 01/20/16 0522 04/12/16 1407  BILITOT 0.60 0.4 0.6 0.81  AST 21 25 27  32  ALT 22 28 30  42  ALKPHOS 120 113 101 152*  PROT 6.9 6.9 6.6 7.4  ALBUMIN 3.7 4.0 3.8 3.7    Assessment and Plan:  Patient with history of segment 8 HCC, status post DEB-TACE in March 2017. Recent follow-up imaging has revealed stable lesion size but interval return of some internal mild enhancement compared to April 2017. She presents again today post follow-up evaluation by Dr. Annamaria Boots for repeat hepatic DEB-TACE. Details/risks of procedure, including but not limited to, internal bleeding, infection, contrast nephropathy, nontarget embolization discussed with patient and son with their understanding and consent. Post procedure she may be admitted for overnight observation depending on symptoms.Labs pending.    Electronically Signed: D. Rowe Robert 05/31/2016, 9:51 AM   I spent a total of 25 minutes at the the patient's bedside AND on the patient's hospital floor or unit, greater than 50% of which was counseling/coordinating care for hepatic arteriogram with [facility] DEB-TACE

## 2016-05-31 NOTE — Sedation Documentation (Signed)
5 Fr sheath removed from R femoral artery by Dr. Annamaria Boots. Hemostasis achieved using Exoseal closure device. R groin level 0, 3+RDP.

## 2016-05-31 NOTE — Sedation Documentation (Signed)
Gauze/tegaderm bandage applied to R femoral artery puncture.  Drsg CDI, 3+ RDP.

## 2016-05-31 NOTE — Sedation Documentation (Signed)
R groin level 0, drsg CDI 3+RDP.

## 2016-06-01 ENCOUNTER — Other Ambulatory Visit: Payer: Self-pay | Admitting: Radiology

## 2016-06-01 ENCOUNTER — Encounter (HOSPITAL_COMMUNITY): Payer: Self-pay | Admitting: *Deleted

## 2016-06-01 DIAGNOSIS — C220 Liver cell carcinoma: Secondary | ICD-10-CM | POA: Diagnosis not present

## 2016-06-01 DIAGNOSIS — E119 Type 2 diabetes mellitus without complications: Secondary | ICD-10-CM | POA: Diagnosis not present

## 2016-06-01 DIAGNOSIS — E039 Hypothyroidism, unspecified: Secondary | ICD-10-CM | POA: Diagnosis not present

## 2016-06-01 DIAGNOSIS — E785 Hyperlipidemia, unspecified: Secondary | ICD-10-CM | POA: Diagnosis not present

## 2016-06-01 DIAGNOSIS — M1990 Unspecified osteoarthritis, unspecified site: Secondary | ICD-10-CM | POA: Diagnosis not present

## 2016-06-01 DIAGNOSIS — I10 Essential (primary) hypertension: Secondary | ICD-10-CM | POA: Diagnosis not present

## 2016-06-01 NOTE — Progress Notes (Signed)
Discharge instructions reviewed with patient. Patient verbalized understanding. Patient discharged via private vehicle with son.

## 2016-06-01 NOTE — Discharge Summary (Signed)
Patient ID: Cordilia Rheault MRN: [id number] DOB/AGE: 07-22-1941 75 y.o.  Admit date: 05/31/2016 Discharge date: 06/01/2016  Supervising Physician: Daryll Brod  Admission Diagnoses: Hepatocellular carcinoma  Discharge Diagnoses: Hepatocellular carcinoma, status post repeat visceral angiograms with peripheral right hepatic DEB- TACE to dominant right hepatic vasculature supplying the known hepatocellular carcinoma segment 8 on 05/31/16  Active Problems:   Hepatocellular carcinoma [facility])  Past Medical History:  Diagnosis Date  . Adhesive capsulitis of right shoulder   . Arthritis   . Complication of anesthesia 5/13   did have some disorientation post cerv fusion due to low sats  . Diabetes mellitus ([place])   . GERD (gastroesophageal reflux disease)   . Hyperlipemia   . Hypertension   . Hypothyroidism   . Left knee DJD   . Partial tear of rotator cuff(726.13) left  . Peripheral neuropathy ([facility])    fingers   . Peripheral vascular disease ([place])   . Seasonal allergies   . Seasonal allergies   . Sleep apnea    CPAP, sleep study 3-4 years ago in [place], [place] Dr. Elbert Ewings   . Urgency of urination   . Wears glasses    Past Surgical History:  Procedure Laterality Date  . ABDOMINAL HYSTERECTOMY     complete  . ANTERIOR CERVICAL DECOMP/DISCECTOMY FUSION  03/10/2012   Procedure: ANTERIOR CERVICAL DECOMPRESSION/DISCECTOMY FUSION 1 LEVEL;  Surgeon: Eustace Moore, MD;  Location: [place] NEURO ORS;  Service: Neurosurgery;  Laterality: N/A;  Cervical five-six anterior cervical decompression fusion with plating  . CARPAL TUNNEL RELEASE     bilat  . CERVICAL FUSION    . CHOLECYSTECTOMY  2009  . COLONOSCOPY W/ POLYPECTOMY    . COLONOSCOPY WITH PROPOFOL N/A 05/23/2015   Procedure: COLONOSCOPY WITH PROPOFOL;  Surgeon: Carol Ada, MD;  Location: [facility];  Service: Endoscopy;  Laterality: N/A;  . ESOPHAGOGASTRODUODENOSCOPY (EGD) WITH PROPOFOL N/A 10/10/2015   Procedure:  ESOPHAGOGASTRODUODENOSCOPY (EGD) WITH PROPOFOL;  Surgeon: Carol Ada, MD;  Location: [facility]OS[facility];  Service: Endoscopy;  Laterality: N/A;  . EYE SURGERY  2012   bilat cataract  . HEEL SPUR EXCISION     bilat  . HEMORRHOID SURGERY  2009  . IR GENERIC HISTORICAL  05/31/2016   IR ANGIOGRAM SELECTIVE EACH ADDITIONAL VESSEL 05/31/2016 Greggory Keen, MD [facility]-INTER[facility] RAD  . IR GENERIC HISTORICAL  05/31/2016   IR ANGIOGRAM VISCERAL SELECTIVE 05/31/2016 Greggory Keen, MD [facility]  . IR GENERIC HISTORICAL  05/31/2016   IR US GUIDE VASC ACCESS RIGHT 05/31/2016 Greggory Keen, MD [facility]  . IR GENERIC HISTORICAL  05/31/2016   IR ANGIOGRAM SELECTIVE EACH ADDITIONAL VESSEL 05/31/2016 Greggory Keen, MD [facility]  . IR GENERIC HISTORICAL  05/31/2016   IR EMBO TUMOR ORGAN ISCHEMIA INFARCT INC GUIDE ROADMAPPING 05/31/2016 Greggory Keen, MD [facility]  . KNEE ARTHROSCOPY     Left x2  . NOSE SURGERY    . SHOULDER ARTHROSCOPY  06/27/2012   Procedure: ARTHROSCOPY SHOULDER;  Surgeon: Lorn Junes, MD;  Location: [place];  Service: Orthopedics;  Laterality: Right;  right shoulder arthroscopy debridement extensive, distal clavulectomy, with resect adhesions with maniplulation  . TONSILLECTOMY    . TOTAL KNEE ARTHROPLASTY Left 07/02/2013   Procedure: TOTAL KNEE ARTHROPLASTY- left;  Surgeon: Lorn Junes, MD;  Location: [place];  Service: Orthopedics;  Laterality: Left;  Marland Kitchen VARICOSE VEIN SURGERY     Right      Discharged Condition: good  Hospital Course: Mrs. Cundiff is a 75 year old  female familiar to IR service from segment 8 [facility] DEB- TACE in March 2017. Recent follow-up imaging  revealed stable lesion size but interval return of some internal mild enhancement compared to April 2017. After recent follow-up exam by Dr. Annamaria Boots she presented again on 05/31/16 for repeat hepatic DEB- TACE of the segment 8 HCC. The procedure was performed without immediate complications and she was admitted for  overnight observation. Overnight the patient did have some mild nausea and epigastric discomfort. She remained afebrile. On the morning of discharge nausea had resolved but minimal epigastric discomfort was still present. She was able to tolerate her diet, ambulate and void without difficulty. Findings were discussed with Dr. Kathlene Cote and she was deemed stable for discharge at this time. She will follow-up with Dr. Annamaria Boots in the IR clinic in one month. She will continue oncologic care with Dr. Burr Medico and resume her usual medications with exception of metformin which will be held for an additional 24 hours. She was told to contact our service with any additional questions or concerns.  Consults: none  Significant Diagnostic Studies:  Results for orders placed or performed during the hospital encounter of 05/31/16  APTT  Result Value Ref Range   aPTT 31 24 - 37 seconds  CBC with Differential/Platelet  Result Value Ref Range   WBC 4.1 4.0 - 10.5 K/uL   RBC 4.13 3.87 - 5.11 MIL/uL   Hemoglobin 11.8 (L) 12.0 - 15.0 g/dL   HCT 38.2 36.0 - 46.0 %   MCV 92.5 78.0 - 100.0 fL   MCH 28.6 26.0 - 34.0 pg   MCHC 30.9 30.0 - 36.0 g/dL   RDW 14.8 11.5 - 15.5 %   Platelets 90 (L) 150 - 400 K/uL   Neutrophils Relative % 57 %   Neutro Abs 2.3 1.7 - 7.7 K/uL   Lymphocytes Relative 25 %   Lymphs Abs 1.0 0.7 - 4.0 K/uL   Monocytes Relative 12 %   Monocytes Absolute 0.5 0.1 - 1.0 K/uL   Eosinophils Relative 6 %   Eosinophils Absolute 0.3 0.0 - 0.7 K/uL   Basophils Relative 1 %   Basophils Absolute 0.0 0.0 - 0.1 K/uL  Comprehensive metabolic panel  Result Value Ref Range   Sodium 140 135 - 145 mmol/L   Potassium 4.0 3.5 - 5.1 mmol/L   Chloride 106 101 - 111 mmol/L   CO2 26 22 - 32 mmol/L   Glucose, Bld 211 (H) 65 - 99 mg/dL   BUN 16 6 - 20 mg/dL   Creatinine, Ser 0.80 0.44 - 1.00 mg/dL   Calcium 8.5 (L) 8.9 - 10.3 mg/dL   Total Protein 6.4 (L) 6.5 - 8.1 g/dL   Albumin 3.6 3.5 - 5.0 g/dL   AST 54 (H)  15 - 41 U/L   ALT 60 (H) 14 - 54 U/L   Alkaline Phosphatase 142 (H) 38 - 126 U/L   Total Bilirubin 1.0 0.3 - 1.2 mg/dL   GFR calc non Af Amer >60 >60 mL/min   GFR calc Af Amer >60 >60 mL/min   Anion gap 8 5 - 15  Protime-INR  Result Value Ref Range   Prothrombin Time 13.9 11.6 - 15.2 seconds   INR 1.05 0.00 - 1.49     Treatments: Successful visceral angiograms and peripheral right hepaticdrug-eluting beads/trans arterial chemo embolization to the dominantright hepatic vasculature supplying the known hepatocellular carcinoma (second treatment) 05/31/16  Discharge Exam: Blood pressure (!) 148/82, pulse (!) 55, temperature 97.9 F (36.6  C), temperature source Oral, resp. rate 17, height 5\' 4"  (1.626 m), weight 242 lb 11.6 oz (110.1 kg), SpO2 99 %. Patient awake, alert. Chest clear to auscultation bilaterally. Heart with slightly bradycardic but regular rhythm. Abdomen obese, soft, positive bowel sounds, mild epigastric tenderness to palpation; puncture site right common femoral artery clean, soft, dry, nontender, no hematoma. Intact distal pulses. Lower extremities with bilateral 1+ edema.  Disposition: home  Discharge Instructions    Call MD for:  difficulty breathing, headache or visual disturbances    Complete by:  As directed   Call MD for:  extreme fatigue    Complete by:  As directed   Call MD for:  hives    Complete by:  As directed   Call MD for:  persistant dizziness or light-headedness    Complete by:  As directed   Call MD for:  persistant nausea and vomiting    Complete by:  As directed   Call MD for:  redness, tenderness, or signs of infection (pain, swelling, redness, odor or green/yellow discharge around incision site)    Complete by:  As directed   Call MD for:  severe uncontrolled pain    Complete by:  As directed   Call MD for:  temperature >100.4    Complete by:  As directed   Change dressing (specify)    Complete by:  As directed   May change bandage over right groin  puncture site and apply Band-Aid to site for the next 2-3 days. May wash site with soap and water.   Diet - low sodium heart healthy    Complete by:  As directed   Diet - low sodium heart healthy    Complete by:  As directed   Discharge instructions    Complete by:  As directed   May resume metformin on 7/26   Driving Restrictions    Complete by:  As directed   No driving for next 24 hours   Increase activity slowly    Complete by:  As directed   Increase activity slowly    Complete by:  As directed   Lifting restrictions    Complete by:  As directed   No heavy lifting for the next 3-4 days   May shower / Bathe    Complete by:  As directed   May walk up steps    Complete by:  As directed       Medication List    TAKE these medications   amLODipine 5 MG tablet Commonly known as:  NORVASC Take 5 mg by mouth every morning.   CENTRUM SILVER PO Take 1 tablet by mouth every morning.   cycloSPORINE 0.05 % ophthalmic emulsion Commonly known as:  RESTASIS Place 1 drop into both eyes 2 (two) times daily.   dexlansoprazole 60 MG capsule Commonly known as:  DEXILANT Take 60 mg by mouth every evening.   docusate sodium 100 MG capsule Commonly known as:  COLACE Take 1 capsule (100 mg total) by mouth daily as needed for mild constipation.   DULoxetine 60 MG capsule Commonly known as:  CYMBALTA Take 60 mg by mouth every evening.   esomeprazole 40 MG capsule Commonly known as:  NEXIUM Take 40 mg by mouth every morning.   FLONASE 50 MCG/ACT nasal spray Generic drug:  fluticasone Place 1 spray into both nostrils 2 (two) times daily as needed for allergies or rhinitis.   furosemide 20 MG tablet Commonly known as:  LASIX Take 20  mg by mouth daily. Reported on 02/10/2016   gabapentin 300 MG capsule Commonly known as:  NEURONTIN Take 300 mg by mouth at bedtime.   hydrochlorothiazide 12.5 MG capsule Commonly known as:  MICROZIDE Take 12.5 mg by mouth daily.   levothyroxine 112 MCG  tablet Commonly known as:  SYNTHROID, LEVOTHROID Take 112 mcg by mouth daily.   lovastatin 40 MG tablet Commonly known as:  MEVACOR Take 40 mg by mouth at bedtime.   metFORMIN 500 MG 24 hr tablet Commonly known as:  GLUCOPHAGE-XR Take 500 mg by mouth daily.   PATANASE 0.6 % Soln Generic drug:  Olopatadine HCl Place 1 each into the nose daily as needed (for allergies).   ranitidine 150 MG tablet Commonly known as:  ZANTAC Take 150 mg by mouth at bedtime. Reported on 11/21/2015   telmisartan 80 MG tablet Commonly known as:  MICARDIS Take 80 mg by mouth every morning.   VISINE 0.05 % ophthalmic solution Generic drug:  tetrahydrozoline Place 1 drop into both eyes 2 (two) times daily as needed (for allergies).      Follow-up Information    Greggory Keen, MD .   Specialty:  Interventional Radiology Why:  Radiology will call you with follow up appointment with Dr. Annamaria Boots in [place] clinic in one month; call [phone] or [phone] with any questions or concerns  Contact information: [address] [place] [address] [phone]        Truitt Merle, MD .   Specialties:  Hematology, Oncology Why:  Continue follow-up with Dr. Burr Medico as scheduled Contact information: [place] [address] [phone]            Electronically Signed: D. Rowe Robert 06/01/2016, 11:08 AM   I have spent less than 30 minutes discharging Sofya Sloan.

## 2016-06-01 NOTE — Care Management Obs Status (Signed)
[place] NOTIFICATION   Patient Details  Name: Megan Horton MRN: [id number] Date of Birth: Sep 20, 1941   Medicare Observation Status Notification Given:  Yes    Leeroy Cha, RN 06/01/2016, 11:36 AM

## 2016-06-01 NOTE — Discharge Instructions (Signed)
Chemoembolization Chemoembolization is a procedure that combines two things: chemotherapy and embolization. It delivers drugs that kill cancer cells (chemotherapy) through a blood vessel directly to a tumor. At the same time, another substance is put into the blood vessel to create a blockage (embolization). This keeps blood from "feeding" the tumor and traps the chemotherapy in place.  Chemoembolization is most often used to treat liver cancer. It is well suited to the liver because the liver has two large blood vessels that supply it with blood. One blood vessel can be used to deliver chemotherapy and then be blocked. The other blood vessel can still get blood to the liver. For most people, chemoembolization helps slow the growth of the tumor or stop it from growing. If the tumor continues to grow or returns after treatment, the procedure can be done again. LET [facility] CARE PROVIDER KNOW ABOUT:  Any allergies you have.  All medicines you are taking, including vitamins, herbs, eye drops, creams, and over-the-counter medicines.  Previous problems you or members of your family have had with the use of anesthetics.  Any blood disorders you have.  Previous surgeries you have had. RISKS AND COMPLICATIONS  Generally, this is a safe procedure. However, as with any procedure, complications can occur. Possible complications include:  Infection or numbness near the surgical cut (incision).  Swelling or bruising.  Slow healing.  Blood clots.  Damage to the blood vessel.  Damage to healthy cells close to the chemoembolization site.  Allergic reaction to the contrast dye used in the procedure.  Side effects from the chemotherapy. These could include nausea, hair loss, and a decrease in red blood cells (anemia).  Damage to the liver. This is rare. BEFORE THE PROCEDURE  Your health care provider may want you to have blood tests. These tests can help tell how well your kidneys and liver  are working. They can also show how well your blood clots.  You may be asked to stop using aspirin and nonsteroidal anti-inflammatory drugs (NSAIDs) for pain relief before your procedure. This includes prescription drugs and over-the-counter drugs. You may also need to stop taking vitamin E. Your health care provider will tell you when to stop taking these medicines and when it is safe to start taking them again.  If you take blood thinners, ask your health care provider when you should stop taking them.  If you are going to be given a medicine that makes you go to sleep (general anesthetic) during your procedure, do not eat or drink anything for 8 hours before your procedure. Required medicines may be taken with a small sip of water.  You might be asked to shower or wash with an antibacterial soap before the procedure.  Make arrangements for someone to drive you home. Depending on the procedure, you may be able to go home the same day. However, most people stay overnight in the hospital after this procedure. Ask your health care provider what to expect. PROCEDURE The procedure may vary depending on which organ or area of the body is being treated. If the liver is the target, the following will be done:  An IV tube will be inserted into one of your veins. Medicine will be able to flow directly into your body through this tube.  You may be given medicine that numbs the chemoembolization insertion site (local anesthetic).  You may be given medicine to relax you (sedative). Some people, including children, may be given a medicine to make them sleep (  general anesthetic).  A needle will be inserted into the femoral artery in the groin.  A thin, flexible tube (catheter) will be inserted into the needle and guided to a blood vessel that enters the liver.  A dye will be injected through your IV tube. Then, X-ray exams will be done. This helps to visualize the exact location of the blood vessels that  lead into your liver.  The chemotherapy drugs and the substance used for embolization will be injected into the blood vessel.  More X-ray exams will be done to make sure the procedure has closed the blood vessel.  The catheter will be removed. Pressure will be put on the incision to stop any bleeding, and a bandage (dressing) will be applied. AFTER THE PROCEDURE  You will stay in a recovery area until the anesthesia has worn off. Your blood pressure and pulse will be checked.  Pain, nausea, vomiting, and fever are all common right after chemoembolization. This is called post-embolization syndrome. You may be given medicine to control this. Be sure to tell your health care provider how you are feeling.  You will need to remain lying down for 6-8 hours. This often means an overnight stay in the hospital. Sometimes people stay longer, but most people go home within 24 hours of the procedure.  You will be sent home with prescriptions and instructions for taking antibiotics to prevent infection, pain medicine, and medicine to stop nausea and vomiting.   This information is not intended to replace advice given to you by your health care provider. Make sure you discuss any questions you have with your health care provider.   Document Released: 01/21/2009 Document Revised: 10/30/2013 Document Reviewed: 07/02/2013 Elsevier Interactive Patient Education 2016 [place] After These instructions give you information about caring for yourself after your procedure. Your doctor may also give you more specific instructions. Call your doctor if you have any problems or questions after your procedure.  HOME CARE  Take medicines only as told by your doctor.  Follow your doctor's instructions about:  Care of the area where the tube was inserted.  Bandage (dressing) changes and removal.  You may shower 24-48 hours after the procedure or as told by your doctor.  Do not take baths,  swim, or use a hot tub until your doctor approves.  Every day, check the area where the tube was inserted. Watch for:  Redness, swelling, or pain.  Fluid, blood, or pus.  Do not apply powder or lotion to the site.  Do not lift anything that is heavier than 10 lb (4.5 kg) for 5 days or as told by your doctor.  Ask your doctor when you can:  Return to work or school.  Do physical activities or play sports.  Have sex.  Do not drive or operate heavy machinery for 24 hours or as told by your doctor.  Have someone with you for the first 24 hours after the procedure.  Keep all follow-up visits as told by your doctor. This is important. GET HELP IF:  You have a fever.   You have chills.   You have more bleeding from the area where the tube was inserted. Hold pressure on the area.  You have redness, swelling, or pain in the area where the tube was inserted.  You have fluid or pus coming from the area. GET HELP RIGHT AWAY IF:   You have a lot of pain in the area where the tube was inserted.  The area where the tube was inserted is bleeding, and the bleeding does not stop after 30 minutes of holding steady pressure on the area.  The area near or just beyond the insertion site becomes pale, cool, tingly, or numb.   This information is not intended to replace advice given to you by your health care provider. Make sure you discuss any questions you have with your health care provider.   Document Released: 01/21/2009 Document Revised: 11/15/2014 Document Reviewed: 03/28/2013 Elsevier Interactive Patient Education 2016 [employer] Moderate Conscious Sedation, Adult Sedation is the use of medicines to promote relaxation and relieve discomfort and anxiety. Moderate conscious sedation is a type of sedation. Under moderate conscious sedation you are less alert than normal but are still able to respond to instructions or stimulation. Moderate conscious sedation is used during short  medical and dental procedures. It is milder than deep sedation or general anesthesia and allows you to return to your regular activities sooner. LET [facility] CARE PROVIDER KNOW ABOUT:   Any allergies you have.  All medicines you are taking, including vitamins, herbs, eye drops, creams, and over-the-counter medicines.  Use of steroids (by mouth or creams).  Previous problems you or members of your family have had with the use of anesthetics.  Any blood disorders you have.  Previous surgeries you have had.  Medical conditions you have.  Possibility of pregnancy, if this applies.  Use of cigarettes, alcohol, or illegal drugs. RISKS AND COMPLICATIONS Generally, this is a safe procedure. However, as with any procedure, problems can occur. Possible problems include:  Oversedation.  Trouble breathing on your own. You may need to have a breathing tube until you are awake and breathing on your own.  Allergic reaction to any of the medicines used for the procedure. BEFORE THE PROCEDURE  You may have blood tests done. These tests can help show how well your kidneys and liver are working. They can also show how well your blood clots.  A physical exam will be done.  Only take medicines as directed by your health care provider. You may need to stop taking medicines (such as blood thinners, aspirin, or nonsteroidal anti-inflammatory drugs) before the procedure.   Do not eat or drink at least 6 hours before the procedure or as directed by your health care provider.  Arrange for a responsible adult, family member, or friend to take you home after the procedure. He or she should stay with you for at least 24 hours after the procedure, until the medicine has worn off. PROCEDURE   An intravenous (IV) catheter will be inserted into one of your veins. Medicine will be able to flow directly into your body through this catheter. You may be given medicine through this tube to help prevent pain  and help you relax.  The medical or dental procedure will be done. AFTER THE PROCEDURE  You will stay in a recovery area until the medicine has worn off. Your blood pressure and pulse will be checked.   Depending on the procedure you had, you may be allowed to go home when you can tolerate liquids and your pain is under control.   This information is not intended to replace advice given to you by your health care provider. Make sure you discuss any questions you have with your health care provider.   Document Released: 07/20/2001 Document Revised: 11/15/2014 Document Reviewed: 07/02/2013 Elsevier Interactive Patient Education [employer].

## 2016-06-02 ENCOUNTER — Ambulatory Visit: Payer: Medicare Other | Admitting: Adult Health

## 2016-06-07 ENCOUNTER — Ambulatory Visit (INDEPENDENT_AMBULATORY_CARE_PROVIDER_SITE_OTHER): Payer: Medicare Other | Admitting: Adult Health

## 2016-06-07 ENCOUNTER — Encounter: Payer: Self-pay | Admitting: Adult Health

## 2016-06-07 DIAGNOSIS — G4730 Sleep apnea, unspecified: Secondary | ICD-10-CM

## 2016-06-07 NOTE — Patient Instructions (Signed)
Continue on C Pap at bedtime Work on weight loss as able. Do not drive a sleepy Follow-up with Dr. Elsworth Soho in 1 year and as needed

## 2016-06-07 NOTE — Assessment & Plan Note (Signed)
Doing well on CPAP   Plan  Cont on CPAP  follow up 1 year and As needed

## 2016-06-07 NOTE — Addendum Note (Signed)
Addended by: Osa Craver on: 06/07/2016 10:19 AM   Modules accepted: Orders

## 2016-06-07 NOTE — Progress Notes (Signed)
Subjective:    Patient ID: Megan Horton, female    DOB: [birth year], 75 y.o.   MRN: [id number]  HPI  75 year old female with obstructive sleep apnea  TESTS  07/2009 PSG AHI 6/h, lowest desatn 81%  06/07/2016 follow-up sleep apnea Returns for one-year follow-up for sleep apnea. Patient says she's doing well on C Pap at bedtime. Feels rested. Uses CPAP on average 8 hours nightly and during naps. Mask fits well.  Download was unobtainable today. Order has been sent to home care company for download.  Has Liver cancer , undergoing active treatment. Feels tired.   Denies chest pain, orthopnea , edema or fever.    Past Medical History:  Diagnosis Date  . Adhesive capsulitis of right shoulder   . Arthritis   . Complication of anesthesia 5/13   did have some disorientation post cerv fusion due to low sats  . Diabetes mellitus ([place])   . GERD (gastroesophageal reflux disease)   . Hyperlipemia   . Hypertension   . Hypothyroidism   . Left knee DJD   . Partial tear of rotator cuff(726.13) left  . Peripheral neuropathy ([facility])    fingers   . Peripheral vascular disease ([place])   . Seasonal allergies   . Seasonal allergies   . Sleep apnea    CPAP, sleep study 3-4 years ago in [place], [place] Dr. Elbert Ewings   . Urgency of urination   . Wears glasses    Current Outpatient Prescriptions on File Prior to Visit  Medication Sig Dispense Refill  . amLODipine (NORVASC) 5 MG tablet Take 5 mg by mouth every morning.     . cycloSPORINE (RESTASIS) 0.05 % ophthalmic emulsion Place 1 drop into both eyes 2 (two) times daily.    Marland Kitchen dexlansoprazole (DEXILANT) 60 MG capsule Take 60 mg by mouth every evening.     . docusate sodium (COLACE) 100 MG capsule Take 1 capsule (100 mg total) by mouth daily as needed for mild constipation. 10 capsule 0  . DULoxetine (CYMBALTA) 60 MG capsule Take 60 mg by mouth every evening.     Marland Kitchen esomeprazole (NEXIUM) 40 MG capsule Take 40 mg by mouth every morning.     .  fluticasone (FLONASE) 50 MCG/ACT nasal spray Place 1 spray into both nostrils 2 (two) times daily as needed for allergies or rhinitis.     . furosemide (LASIX) 20 MG tablet Take 20 mg by mouth daily. Reported on 02/10/2016    . gabapentin (NEURONTIN) 300 MG capsule Take 300 mg by mouth at bedtime.     . hydrochlorothiazide (MICROZIDE) 12.5 MG capsule Take 12.5 mg by mouth daily.     Marland Kitchen levothyroxine (SYNTHROID, LEVOTHROID) 112 MCG tablet Take 112 mcg by mouth daily.    Marland Kitchen lovastatin (MEVACOR) 40 MG tablet Take 40 mg by mouth at bedtime.    . metFORMIN (GLUCOPHAGE-XR) 500 MG 24 hr tablet Take 500 mg by mouth daily.    . Multiple Vitamins-Minerals (CENTRUM SILVER PO) Take 1 tablet by mouth every morning.     . Olopatadine HCl (PATANASE) 0.6 % SOLN Place 1 each into the nose daily as needed (for allergies).    . ranitidine (ZANTAC) 150 MG tablet Take 150 mg by mouth at bedtime. Reported on 11/21/2015    . telmisartan (MICARDIS) 80 MG tablet Take 80 mg by mouth every morning.     Marland Kitchen tetrahydrozoline (VISINE) 0.05 % ophthalmic solution Place 1 drop into both eyes 2 (two) times daily as needed (for allergies).  No current facility-administered medications on file prior to visit.       Review of Systems Constitutional:   No  weight loss, night sweats,  Fevers, chills,  +fatigue, or  lassitude.  HEENT:   No headaches,  Difficulty swallowing,  Tooth/dental problems, or  Sore throat,                No sneezing, itching, ear ache, nasal congestion, post nasal drip,   CV:  No chest pain,  Orthopnea, PND, swelling in lower extremities, anasarca, dizziness, palpitations, syncope.   GI  No heartburn, indigestion, abdominal pain, nausea, vomiting, diarrhea, change in bowel habits, loss of appetite, bloody stools.   Resp: No shortness of breath with exertion or at rest.  No excess mucus, no productive cough,  No non-productive cough,  No coughing up of blood.  No change in color of mucus.  No wheezing.  No  chest wall deformity  Skin: no rash or lesions.  GU: no dysuria, change in color of urine, no urgency or frequency.  No flank pain, no hematuria   MS:  No joint pain or swelling.  No decreased range of motion.  No back pain.  Psych:  No change in mood or affect. No depression or anxiety.  No memory loss.         Objective:   Physical Exam Vitals:   06/07/16 0936  BP: (!) 148/86  Pulse: 64  Temp: 98.6 F (37 C)  TempSrc: Oral  SpO2: 97%  Weight: 232 lb (105.2 kg)  Height: 5\' 4"  (1.626 m)  Body mass index is 39.82 kg/m.   GEN: A/Ox3; pleasant , NAD, obese    HEENT:  [place]/AT,  EACs-clear, TMs-wnl, NOSE-clear, THROAT-clear, no lesions, no postnasal drip or exudate noted. Class 3 MP airway   NECK:  Supple w/ fair ROM; no JVD; normal carotid impulses w/o bruits; no thyromegaly or nodules palpated; no lymphadenopathy.    RESP  Clear  P & A; w/o, wheezes/ rales/ or rhonchi. no accessory muscle use, no dullness to percussion  CARD:  RRR, no m/r/g  , no peripheral edema, pulses intact, no cyanosis or clubbing.  GI:   Soft & nt; nml bowel sounds; no organomegaly or masses detected.   Musco: Warm bil, no deformities or joint swelling noted.   Neuro: alert, no focal deficits noted.    Skin: Warm, no lesions or rashes  Tammy Parrett NP-C  [facility]  06/07/2016

## 2016-06-08 ENCOUNTER — Telehealth: Payer: Self-pay | Admitting: Radiology

## 2016-06-08 DIAGNOSIS — M25552 Pain in left hip: Secondary | ICD-10-CM | POA: Diagnosis not present

## 2016-06-08 DIAGNOSIS — M25559 Pain in unspecified hip: Secondary | ICD-10-CM | POA: Diagnosis not present

## 2016-06-08 DIAGNOSIS — M79641 Pain in right hand: Secondary | ICD-10-CM | POA: Diagnosis not present

## 2016-06-08 DIAGNOSIS — M79643 Pain in unspecified hand: Secondary | ICD-10-CM | POA: Diagnosis not present

## 2016-06-08 DIAGNOSIS — M8580 Other specified disorders of bone density and structure, unspecified site: Secondary | ICD-10-CM | POA: Diagnosis not present

## 2016-06-08 DIAGNOSIS — M2550 Pain in unspecified joint: Secondary | ICD-10-CM | POA: Diagnosis not present

## 2016-06-08 DIAGNOSIS — M859 Disorder of bone density and structure, unspecified: Secondary | ICD-10-CM | POA: Diagnosis not present

## 2016-06-08 DIAGNOSIS — Z13820 Encounter for screening for osteoporosis: Secondary | ICD-10-CM | POA: Diagnosis not present

## 2016-06-08 DIAGNOSIS — M79642 Pain in left hand: Secondary | ICD-10-CM | POA: Diagnosis not present

## 2016-06-08 NOTE — Telephone Encounter (Signed)
Patient called for Dr. Fritz Pickerel approval prior to taking meds for arthritis & osteoporosis as recommended by r [facility] Rocker.  Dr Annamaria Boots contacted and this meets with his approval.  Patient notified.  Donavin Audino Riki Rusk, RN 06/08/2016 3:01 PM

## 2016-06-21 DIAGNOSIS — K7460 Unspecified cirrhosis of liver: Secondary | ICD-10-CM | POA: Diagnosis not present

## 2016-06-21 DIAGNOSIS — C229 Malignant neoplasm of liver, not specified as primary or secondary: Secondary | ICD-10-CM | POA: Diagnosis not present

## 2016-06-22 DIAGNOSIS — M2550 Pain in unspecified joint: Secondary | ICD-10-CM | POA: Diagnosis not present

## 2016-06-22 DIAGNOSIS — H04129 Dry eye syndrome of unspecified lacrimal gland: Secondary | ICD-10-CM | POA: Diagnosis not present

## 2016-06-22 DIAGNOSIS — M8580 Other specified disorders of bone density and structure, unspecified site: Secondary | ICD-10-CM | POA: Diagnosis not present

## 2016-06-22 DIAGNOSIS — M79643 Pain in unspecified hand: Secondary | ICD-10-CM | POA: Diagnosis not present

## 2016-07-07 ENCOUNTER — Ambulatory Visit
Admission: RE | Admit: 2016-07-07 | Discharge: 2016-07-07 | Disposition: A | Discharge status: Home / Self Care | Payer: Medicare Other | Source: Ambulatory Visit | Attending: Radiology | Admitting: Radiology

## 2016-07-07 DIAGNOSIS — C220 Liver cell carcinoma: Secondary | ICD-10-CM | POA: Diagnosis not present

## 2016-07-07 HISTORY — PX: IR GENERIC HISTORICAL: IMG1180011

## 2016-07-07 NOTE — Progress Notes (Signed)
Patient ID: Megan Horton, female   DOB: 11/21/1940, 75 y.o.   MRN: [id number]       Chief Complaint: Moderately differentiated hepatocellular carcinoma, one month status post second DEB TACE.  Referring Physician(s): Burr Medico  History of Present Illness: Megan Horton is a 75 y.o. female with moderately differentiated right hepatocellular carcinoma. Lesion is within segment 8. Original lesion size 4.5 cm. Patient is one month status post second DEB TACE. She returns for outpatient follow-up. Over the last month she has recovered from a mild post embolization syndrome. Right upper quadrant pain has resolved. No signs of jaundice. Overall she is back to her baseline. Functional status remains very good. No fevers. Repeat imaging is not due for 2 months.  Past Medical History:  Diagnosis Date  . Adhesive capsulitis of right shoulder   . Arthritis   . Complication of anesthesia 5/13   did have some disorientation post cerv fusion due to low sats  . Diabetes mellitus ([place])   . GERD (gastroesophageal reflux disease)   . Hyperlipemia   . Hypertension   . Hypothyroidism   . Left knee DJD   . Partial tear of rotator cuff(726.13) left  . Peripheral neuropathy ([facility])    fingers   . Peripheral vascular disease ([place])   . Seasonal allergies   . Seasonal allergies   . Sleep apnea    CPAP, sleep study 3-4 years ago in [place], [place] Dr. Elbert Ewings   . Urgency of urination   . Wears glasses     Past Surgical History:  Procedure Laterality Date  . ABDOMINAL HYSTERECTOMY     complete  . ANTERIOR CERVICAL DECOMP/DISCECTOMY FUSION  03/10/2012   Procedure: ANTERIOR CERVICAL DECOMPRESSION/DISCECTOMY FUSION 1 LEVEL;  Surgeon: Eustace Moore, MD;  Location: [facility];  Service: Neurosurgery;  Laterality: N/A;  Cervical five-six anterior cervical decompression fusion with plating  . CARPAL TUNNEL RELEASE     bilat  . CERVICAL FUSION    . CHOLECYSTECTOMY  2009  . COLONOSCOPY W/ POLYPECTOMY    .  COLONOSCOPY WITH PROPOFOL N/A 05/23/2015   Procedure: COLONOSCOPY WITH PROPOFOL;  Surgeon: Carol Ada, MD;  Location: [facility];  Service: Endoscopy;  Laterality: N/A;  . ESOPHAGOGASTRODUODENOSCOPY (EGD) WITH PROPOFOL N/A 10/10/2015   Procedure: ESOPHAGOGASTRODUODENOSCOPY (EGD) WITH PROPOFOL;  Surgeon: Carol Ada, MD;  Location: [facility];  Service: Endoscopy;  Laterality: N/A;  . EYE SURGERY  2012   bilat cataract  . HEEL SPUR EXCISION     bilat  . HEMORRHOID SURGERY  2009  . IR GENERIC HISTORICAL  05/31/2016   IR ANGIOGRAM SELECTIVE EACH ADDITIONAL VESSEL 05/31/2016 Greggory Keen, MD [facility]  . IR GENERIC HISTORICAL  05/31/2016   IR ANGIOGRAM VISCERAL SELECTIVE 05/31/2016 Greggory Keen, MD [facility]  . IR GENERIC HISTORICAL  05/31/2016   IR US GUIDE VASC ACCESS RIGHT 05/31/2016 Greggory Keen, MD [facility]  . IR GENERIC HISTORICAL  05/31/2016   IR ANGIOGRAM SELECTIVE EACH ADDITIONAL VESSEL 05/31/2016 Greggory Keen, MD [facility]  . IR GENERIC HISTORICAL  05/31/2016   IR EMBO TUMOR ORGAN ISCHEMIA INFARCT INC GUIDE ROADMAPPING 05/31/2016 Greggory Keen, MD [facility]  . KNEE ARTHROSCOPY     Left x2  . NOSE SURGERY    . SHOULDER ARTHROSCOPY  06/27/2012   Procedure: ARTHROSCOPY SHOULDER;  Surgeon: Lorn Junes, MD;  Location: [facility];  Service: Orthopedics;  Laterality: Right;  right shoulder arthroscopy debridement extensive, distal clavulectomy, with resect adhesions with maniplulation  .  TONSILLECTOMY    . TOTAL KNEE ARTHROPLASTY Left 07/02/2013   Procedure: TOTAL KNEE ARTHROPLASTY- left;  Surgeon: Lorn Junes, MD;  Location: [facility];  Service: Orthopedics;  Laterality: Left;  Marland Kitchen VARICOSE VEIN SURGERY     Right    Allergies: Asa [aspirin]; Adhesive [tape]; and Tylenol [acetaminophen]  Medications: Prior to Admission medications   Medication Sig Start Date End Date Taking? Authorizing Provider  amLODipine (NORVASC) 5 MG tablet Take 5 mg by  mouth every morning.     Historical Provider, MD  cycloSPORINE (RESTASIS) 0.05 % ophthalmic emulsion Place 1 drop into both eyes 2 (two) times daily.    Historical Provider, MD  dexlansoprazole (DEXILANT) 60 MG capsule Take 60 mg by mouth every evening.     Historical Provider, MD  docusate sodium (COLACE) 100 MG capsule Take 1 capsule (100 mg total) by mouth daily as needed for mild constipation. 01/20/16   Saverio Danker, PA-C  DULoxetine (CYMBALTA) 60 MG capsule Take 60 mg by mouth every evening.     Historical Provider, MD  esomeprazole (NEXIUM) 40 MG capsule Take 40 mg by mouth every morning.     Historical Provider, MD  fluticasone (FLONASE) 50 MCG/ACT nasal spray Place 1 spray into both nostrils 2 (two) times daily as needed for allergies or rhinitis.     Historical Provider, MD  furosemide (LASIX) 20 MG tablet Take 20 mg by mouth daily. Reported on 02/10/2016 10/20/15   Historical Provider, MD  gabapentin (NEURONTIN) 300 MG capsule Take 300 mg by mouth at bedtime.     Historical Provider, MD  hydrochlorothiazide (MICROZIDE) 12.5 MG capsule Take 12.5 mg by mouth daily.     Historical Provider, MD  levothyroxine (SYNTHROID, LEVOTHROID) 112 MCG tablet Take 112 mcg by mouth daily. 12/22/15   Historical Provider, MD  lovastatin (MEVACOR) 40 MG tablet Take 40 mg by mouth at bedtime.    Historical Provider, MD  metFORMIN (GLUCOPHAGE-XR) 500 MG 24 hr tablet Take 500 mg by mouth daily. 05/12/16   Historical Provider, MD  Multiple Vitamins-Minerals (CENTRUM SILVER PO) Take 1 tablet by mouth every morning.     Historical Provider, MD  Olopatadine HCl (PATANASE) 0.6 % SOLN Place 1 each into the nose daily as needed (for allergies).    Historical Provider, MD  ranitidine (ZANTAC) 150 MG tablet Take 150 mg by mouth at bedtime. Reported on 11/21/2015    Historical Provider, MD  telmisartan (MICARDIS) 80 MG tablet Take 80 mg by mouth every morning.     Historical Provider, MD  tetrahydrozoline (VISINE) 0.05 %  ophthalmic solution Place 1 drop into both eyes 2 (two) times daily as needed (for allergies).     Historical Provider, MD     Family History  Problem Relation Age of Onset  . Heart disease Mother   . Hypertension Mother   . Stroke Mother   . Heart disease Father   . Heart attack Father   . Hypertension Father   . Heart disease Brother   . Heart attack Brother   . Heart attack Sister   . Hypertension Sister   . Stroke Brother     Social History   Social History  . Marital status: Widowed    Spouse name: N/A  . Number of children: N/A  . Years of education: N/A   Occupational History  . retired    Social History Main Topics  . Smoking status: Former Smoker    Packs/day: 0.50    Years: 35.00  Types: Cigarettes    Quit date: 12/26/1993  . Smokeless tobacco: Never Used  . Alcohol use Yes     Comment: very rarely wine, on holidays   . Drug use: No  . Sexual activity: Not on file   Other Topics Concern  . Not on file   Social History Narrative  . No narrative on file    ECOG Status: 1 - Symptomatic but completely ambulatory  Review of Systems: A 12 point ROS discussed and pertinent positives are indicated in the HPI above.  All other systems are negative.  Review of Systems  Vital Signs: BP (!) 160/72 (BP Location: Left Arm, Patient Position: Sitting, Cuff Size: Large)   Pulse 70   Temp 98.2 F (36.8 C) (Oral)   Resp 16   SpO2 93%   Physical Exam  Constitutional: She is oriented to person, place, and time. She appears well-developed and well-nourished. No distress.  Eyes: No scleral icterus.  Cardiovascular: Normal rate, regular rhythm, normal heart sounds and intact distal pulses.   No murmur heard. Normal symmetric femoral and pedal pulses post procedure.  Pulmonary/Chest: Effort normal and breath sounds normal. No respiratory distress.  Abdominal: Soft. Bowel sounds are normal. She exhibits no distension. There is no tenderness.  Neurological: She  is alert and oriented to person, place, and time.  Skin: Skin is warm and dry. No rash noted. She is not diaphoretic. No erythema.  Psychiatric: She has a normal mood and affect. Her behavior is normal.    Imaging: No results found.  Labs:  CBC:  Recent Labs  01/19/16 0930 01/20/16 0522 04/12/16 1407 05/31/16 1025  WBC 5.0 8.1 6.6 4.1  HGB 12.2 13.0 12.9 11.8*  HCT 37.0 41.1 40.1 38.2  PLT 109* 123* 100* 90*    COAGS:  Recent Labs  11/21/15 1259 01/19/16 0930 05/31/16 1025  INR 1.0 0.94 1.05  APTT  --  30 31    BMP:  Recent Labs  10/07/15 1438  01/19/16 0930 01/20/16 0522 04/12/16 1407 05/31/16 1025  NA 143  < > 143 141 143 140  K 3.6  < > 3.7 3.7 3.8 4.0  CL 107  --  107 106  --  106  CO2 30  < > 26 25 28 26   GLUCOSE 109*  < > 140* 163* 163* 211*  BUN 9  < > 16 12 8.2 16  CALCIUM 9.2  < > 8.8* 8.8* 9.6 8.5*  CREATININE 0.64  < > 0.68 0.57 0.8 0.80  GFRNONAA >60  --  >60 >60  --  >60  GFRAA >60  --  >60 >60  --  >60  < > = values in this interval not displayed.  LIVER FUNCTION TESTS:  Recent Labs  01/19/16 0930 01/20/16 0522 04/12/16 1407 05/31/16 1025  BILITOT 0.4 0.6 0.81 1.0  AST 25 27 32 54*  ALT 28 30 42 60*  ALKPHOS 113 101 152* 142*  PROT 6.9 6.6 7.4 6.4*  ALBUMIN 4.0 3.8 3.7 3.6    TUMOR MARKERS: No results for input(s): AFPTM, CEA, CA199, CHROMGRNA in the last 8760 hours.  Assessment and Plan:  1 month status post second DEB TACE for moderately differentiated hepatocellular carcinoma. She has recovered from the procedure very well. Mild post embolization syndrome has resolved. No current symptoms or limitations. She is back to her baseline. No signs of jaundice. No significant abdominal or flank pain.  Plan: Repeat MRI at 3 months posttreatment in early November. This  be scheduled as an outpatient. I will see her back after the MRI scan.    Electronically Signed: Greggory Keen 07/07/2016, 9:51 AM   I spent a total of     25 Minutes in face to face in clinical consultation, greater than 50% of which was counseling/coordinating care for this patient with hepatocellular carcinoma.

## 2016-07-09 IMAGING — MR MR ABDOMEN WO/W CM
6 of 17 series · 19 of 48 positions shown · IV contrast (10ml Eovist)
Comparison: None.

CLINICAL DATA: 4.5 cm liver lesion on outside CT. Weight loss
starting in [REDACTED]. Difficulty keeping food down. Vomiting.

EXAM:
MRI ABDOMEN WITHOUT AND WITH CONTRAST
TECHNIQUE: Multiplanar multisequence MR imaging of the abdomen was performed
both before and after the administration of intravenous contrast.
CONTRAST:  10.0 ml Eovist, a mixed extracellular and hepatocyte
specific contrast agent.

[Series 3: T2 · coronal · 6.0mm · 1.56mm/px · 2 of 31 slices shown]
[im 1/31]
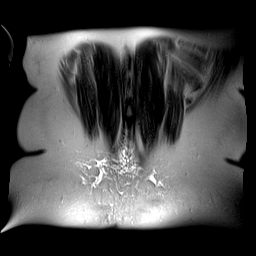
[im 31/31]
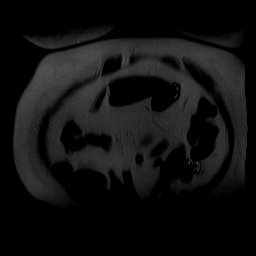

[Series 4: axial in out · axial · 6.0mm · 0.78mm/px · z∈[-81,+163]mm · 4 of 70 slices shown]
[im 1/70]
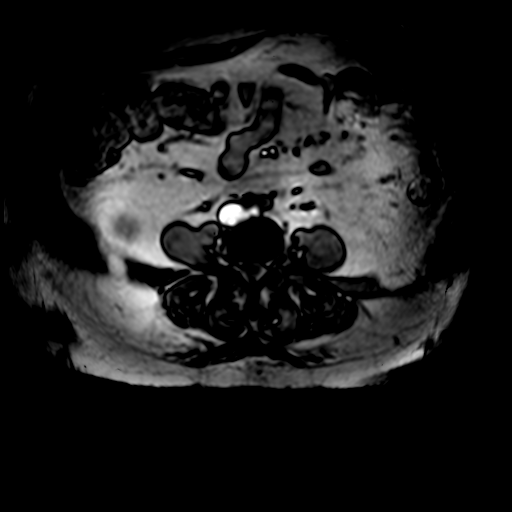
[im 24/70]
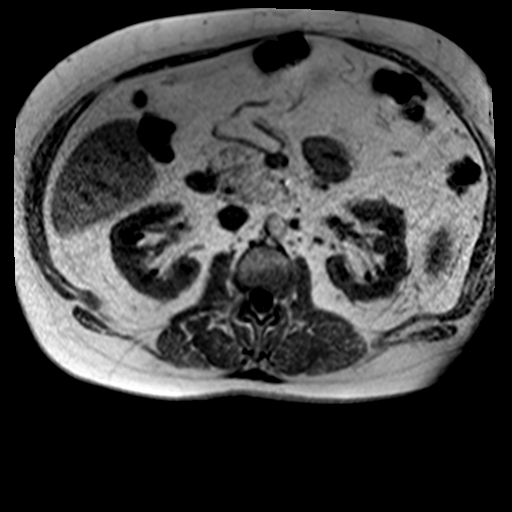
[im 47/70]
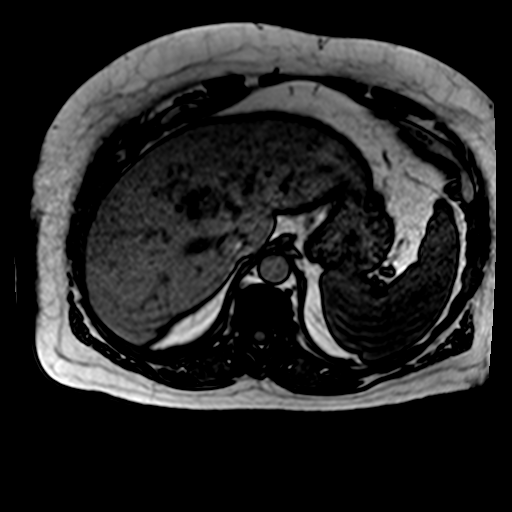
[im 70/70]
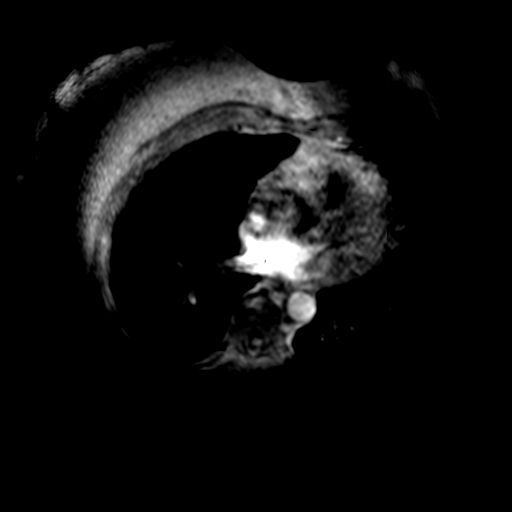

[Series 5: T1 dynamic · axial · non-contrast · 2.5mm · 0.78mm/px · z∈[-47,+150]mm · 4 of 80 slices shown]
[im 1/80]
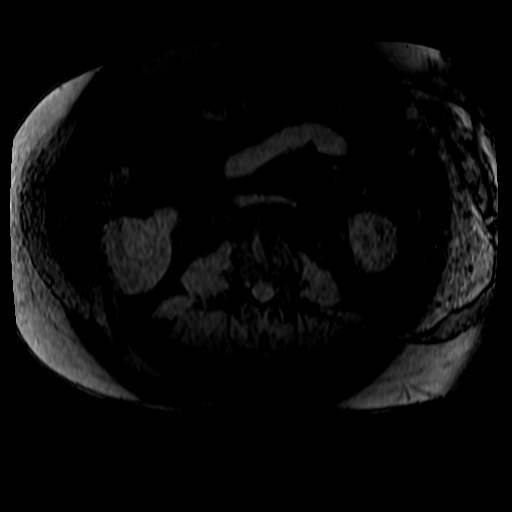
[im 27/80]
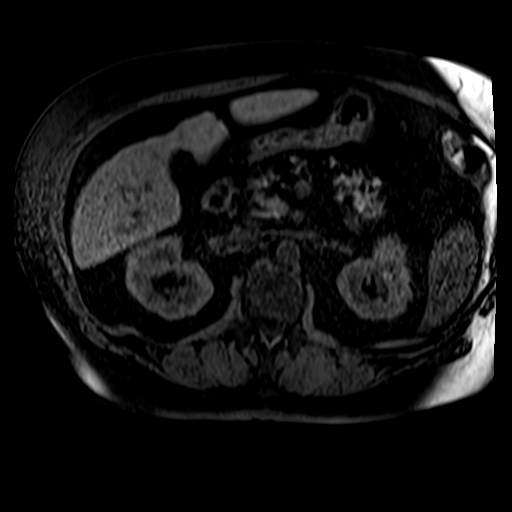
[im 53/80]
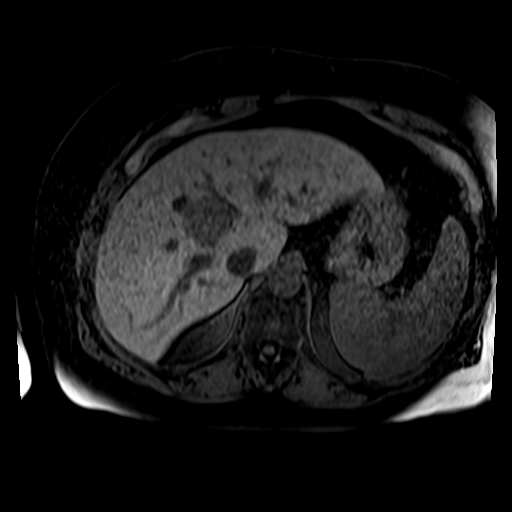
[im 80/80]
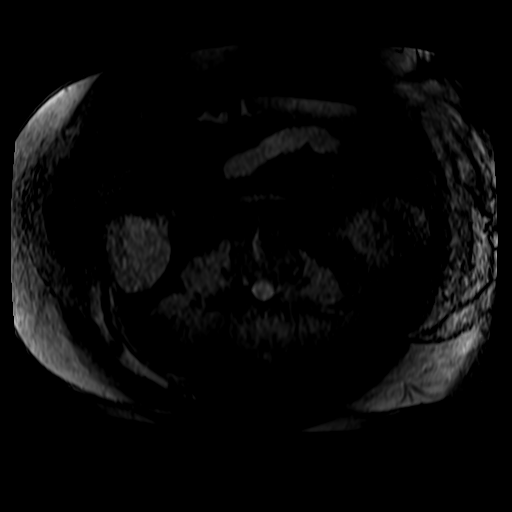

[Series 6: post immed · axial · 2.5mm · 0.78mm/px · z∈[-47,+150]mm · 4 of 80 slices shown]
[im 1/80]
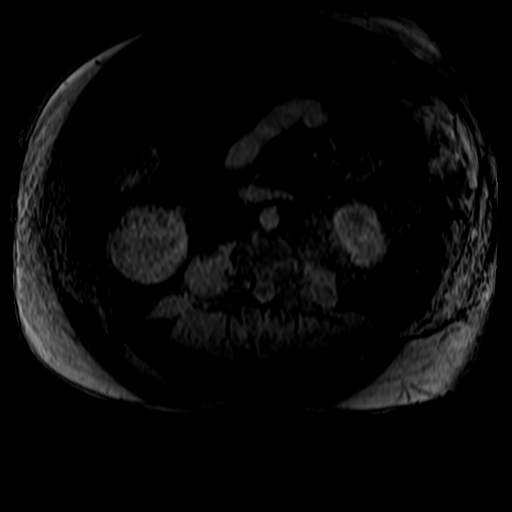
[im 27/80]
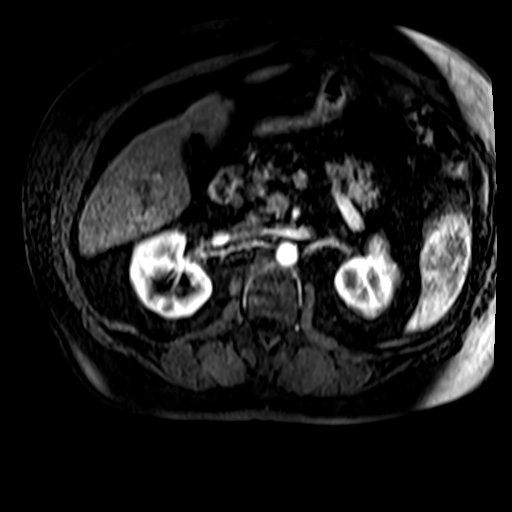
[im 53/80]
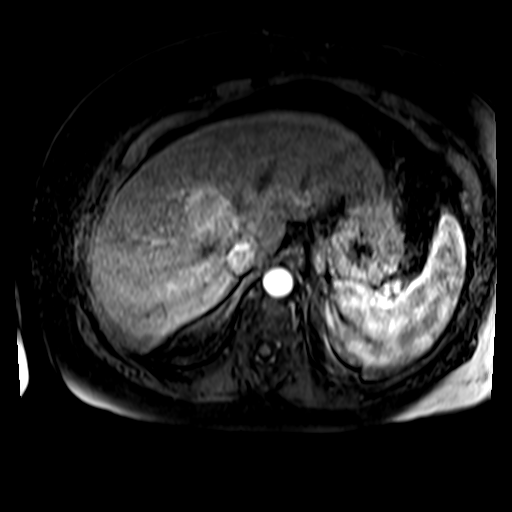
[im 80/80]
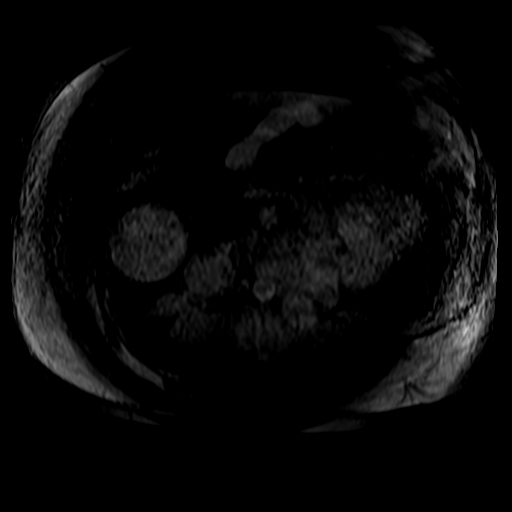

[Series 7: post immed_sub · axial · 2.5mm · 0.78mm/px · z∈[-47,+150]mm · 4 of 80 slices shown]
[im 1/80]
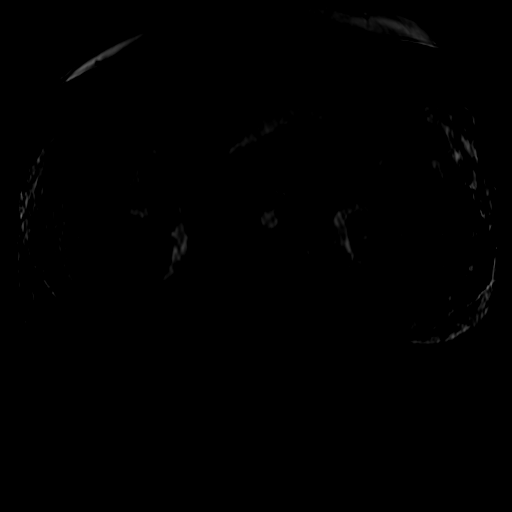
[im 27/80]
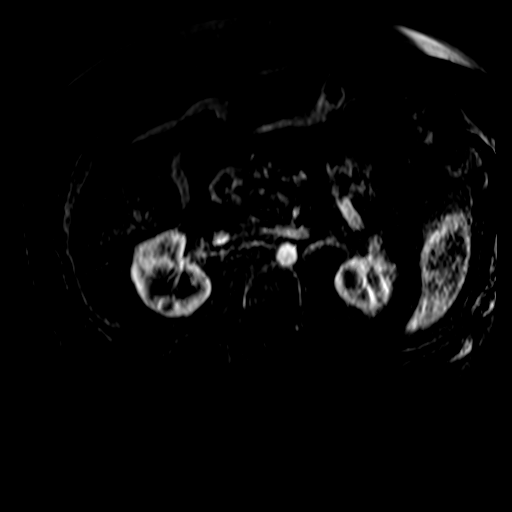
[im 53/80]
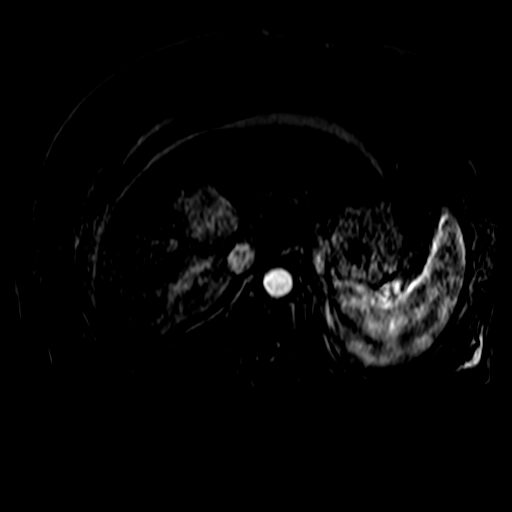
[im 80/80]
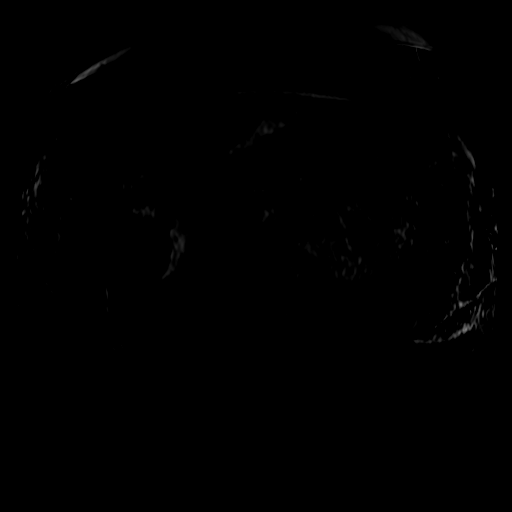

[Series 8: post 45 sec · axial · 2.5mm · 0.78mm/px · 1 of 80 slices shown]
[im 1/80]
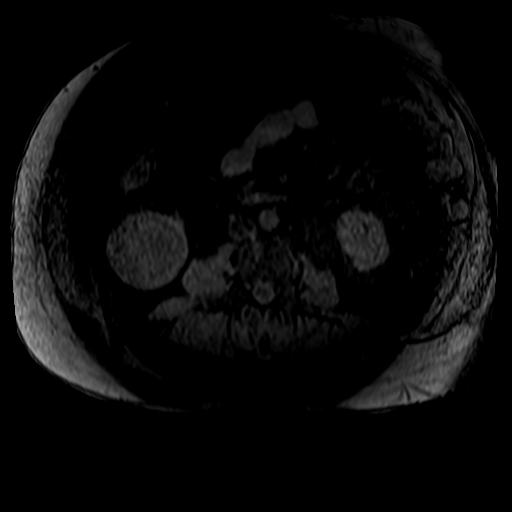

[19 of 48 positions shown; findings below may reference images not displayed]

FINDINGS: Lower chest: Normal heart size without pericardial or pleural
effusion.

Hepatobiliary: Mild hepatomegaly. Caudate lobe is mildly prominent,
including on image 38/series 10. Within the central portion of the
anterior segment right liver lobe (segment 8) a 4.0 x 4.1 cm lesion
is identified on image 28/series 5. This is mildly T2 hyperintense,
T1 hypo intense. Demonstrates heterogeneous arterial phase hyper
enhancement on image 29/ series 6. Portal venous phase and delayed
hypo enhancement with a suggestion of a peripheral "Pseudo capsule"
including on image 29/series 9. No uptake on hepatobiliary phase
images. Hepatic and portal veins patent. No intrahepatic biliary
duct dilatation. Cholecystectomy without common duct dilatation.

Pancreas: Tiny cystic foci within the pancreas, including a 6 mm
area in the pancreatic head on image 23/series 14. No dominant mass
or main pancreatic duct dilatation.

Spleen: Normal in size, without focal abnormality.

Adrenals/Urinary Tract: Tiny upper pole right renal lesion is likely
a cyst. A dominant 5.6 cm lower pole right renal cyst. Normal left
kidney, without hydronephrosis.

Stomach/Bowel: Normal stomach and abdominal bowel loops.

Vascular/Lymphatic: Normal caliber of the aorta and branch vessels.
No retroperitoneal or retrocrural adenopathy.

Other: No ascites.

Musculoskeletal: No acute osseous abnormality.
IMPRESSION: 1. Segment 8 liver lesion has suspicious imaging characteristics.
Favor hepatocellular carcinoma. Correlate with risk factors for mild
cirrhosis, given caudate lobe enlargement. An atypical appearance of
metastatic disease could look similar. Tissue sampling should be
considered.
2. No evidence of primary malignancy within the abdomen.
3. Mild hepatomegaly.
4. Tiny cystic foci in the pancreas are likely small pseudocysts or
areas of duct ectasia. These could be re-evaluated on follow-up. If
the patient does not otherwise undergo follow-up imaging, per
consensus criteria, followup with pre and post contrast abdominal
MRI at 1 year should be considered. This recommendation follows ACR
consensus guidelines: Managing Incidental Findings on Abdominal CT:
White Paper of the ACR Incidental Findings Committee. [HOSPITAL] 0949;[DATE]

## 2016-07-14 ENCOUNTER — Telehealth: Payer: Self-pay | Admitting: Radiology

## 2016-07-14 NOTE — Progress Notes (Signed)
   Pt calls to ask for note regarding dental procedure and ability to undergo same after 05/31/16 DEB/TACE for hepatocellular cancer  Discussed with Dr Vernard Gambles  No contraindication to dental procedure from IR standpoint.  Will fax note to Dr Konrad Felix [phone]

## 2016-08-17 DIAGNOSIS — M19031 Primary osteoarthritis, right wrist: Secondary | ICD-10-CM | POA: Diagnosis not present

## 2016-08-17 DIAGNOSIS — M25531 Pain in right wrist: Secondary | ICD-10-CM | POA: Diagnosis not present

## 2016-08-24 DIAGNOSIS — M79643 Pain in unspecified hand: Secondary | ICD-10-CM | POA: Diagnosis not present

## 2016-08-24 DIAGNOSIS — M2550 Pain in unspecified joint: Secondary | ICD-10-CM | POA: Diagnosis not present

## 2016-08-24 DIAGNOSIS — M65831 Other synovitis and tenosynovitis, right forearm: Secondary | ICD-10-CM | POA: Diagnosis not present

## 2016-08-24 DIAGNOSIS — M064 Inflammatory polyarthropathy: Secondary | ICD-10-CM | POA: Diagnosis not present

## 2016-08-24 DIAGNOSIS — M65841 Other synovitis and tenosynovitis, right hand: Secondary | ICD-10-CM | POA: Diagnosis not present

## 2016-08-31 DIAGNOSIS — M65841 Other synovitis and tenosynovitis, right hand: Secondary | ICD-10-CM | POA: Diagnosis not present

## 2016-08-31 DIAGNOSIS — M064 Inflammatory polyarthropathy: Secondary | ICD-10-CM | POA: Diagnosis not present

## 2016-08-31 DIAGNOSIS — Z23 Encounter for immunization: Secondary | ICD-10-CM | POA: Diagnosis not present

## 2016-08-31 DIAGNOSIS — M25539 Pain in unspecified wrist: Secondary | ICD-10-CM | POA: Diagnosis not present

## 2016-08-31 DIAGNOSIS — M79643 Pain in unspecified hand: Secondary | ICD-10-CM | POA: Diagnosis not present

## 2016-09-13 DIAGNOSIS — I129 Hypertensive chronic kidney disease with stage 1 through stage 4 chronic kidney disease, or unspecified chronic kidney disease: Secondary | ICD-10-CM | POA: Diagnosis not present

## 2016-09-13 DIAGNOSIS — E785 Hyperlipidemia, unspecified: Secondary | ICD-10-CM | POA: Diagnosis not present

## 2016-09-13 DIAGNOSIS — E1122 Type 2 diabetes mellitus with diabetic chronic kidney disease: Secondary | ICD-10-CM | POA: Diagnosis not present

## 2016-09-16 ENCOUNTER — Other Ambulatory Visit (HOSPITAL_COMMUNITY): Payer: Self-pay | Admitting: Interventional Radiology

## 2016-09-16 DIAGNOSIS — C220 Liver cell carcinoma: Secondary | ICD-10-CM

## 2016-09-27 DIAGNOSIS — E039 Hypothyroidism, unspecified: Secondary | ICD-10-CM | POA: Diagnosis not present

## 2016-09-27 DIAGNOSIS — E785 Hyperlipidemia, unspecified: Secondary | ICD-10-CM | POA: Diagnosis not present

## 2016-09-27 DIAGNOSIS — E559 Vitamin D deficiency, unspecified: Secondary | ICD-10-CM | POA: Diagnosis not present

## 2016-09-27 DIAGNOSIS — E1122 Type 2 diabetes mellitus with diabetic chronic kidney disease: Secondary | ICD-10-CM | POA: Diagnosis not present

## 2016-09-27 DIAGNOSIS — I129 Hypertensive chronic kidney disease with stage 1 through stage 4 chronic kidney disease, or unspecified chronic kidney disease: Secondary | ICD-10-CM | POA: Diagnosis not present

## 2016-09-27 DIAGNOSIS — M8580 Other specified disorders of bone density and structure, unspecified site: Secondary | ICD-10-CM | POA: Diagnosis not present

## 2016-09-28 DIAGNOSIS — E119 Type 2 diabetes mellitus without complications: Secondary | ICD-10-CM | POA: Diagnosis not present

## 2016-10-05 ENCOUNTER — Ambulatory Visit (HOSPITAL_COMMUNITY)
Admission: RE | Admit: 2016-10-05 | Discharge: 2016-10-05 | Disposition: A | Discharge status: Home / Self Care | Payer: Medicare Other | Source: Ambulatory Visit | Attending: Interventional Radiology | Admitting: Interventional Radiology

## 2016-10-05 ENCOUNTER — Ambulatory Visit
Admission: RE | Admit: 2016-10-05 | Discharge: 2016-10-05 | Disposition: A | Discharge status: Home / Self Care | Payer: Medicare Other | Source: Ambulatory Visit | Attending: Interventional Radiology | Admitting: Interventional Radiology

## 2016-10-05 DIAGNOSIS — C220 Liver cell carcinoma: Secondary | ICD-10-CM | POA: Insufficient documentation

## 2016-10-05 DIAGNOSIS — K7689 Other specified diseases of liver: Secondary | ICD-10-CM | POA: Diagnosis not present

## 2016-10-05 DIAGNOSIS — I700 Atherosclerosis of aorta: Secondary | ICD-10-CM | POA: Diagnosis not present

## 2016-10-05 DIAGNOSIS — I129 Hypertensive chronic kidney disease with stage 1 through stage 4 chronic kidney disease, or unspecified chronic kidney disease: Secondary | ICD-10-CM | POA: Diagnosis not present

## 2016-10-05 DIAGNOSIS — E1122 Type 2 diabetes mellitus with diabetic chronic kidney disease: Secondary | ICD-10-CM | POA: Diagnosis not present

## 2016-10-05 DIAGNOSIS — R161 Splenomegaly, not elsewhere classified: Secondary | ICD-10-CM | POA: Diagnosis not present

## 2016-10-05 DIAGNOSIS — D696 Thrombocytopenia, unspecified: Secondary | ICD-10-CM | POA: Diagnosis not present

## 2016-10-05 HISTORY — PX: IR GENERIC HISTORICAL: IMG1180011

## 2016-10-05 LAB — POCT I-STAT CREATININE: CREATININE: 0.7 mg/dL (ref 0.44–1.00)

## 2016-10-05 MED ORDER — GADOXETATE DISODIUM 0.25 MMOL/ML IV SOLN
10.0000 mL | Freq: Once | INTRAVENOUS | Status: AC | PRN
  Administered 2016-10-05: 10 mL via INTRAVENOUS

## 2016-10-05 NOTE — Progress Notes (Signed)
Patient ID: Megan Horton, female   DOB: 1941/09/10, 75 y.o.   MRN: [id number]       Chief Complaint:  Moderately differentiated hepatocellular carcinoma, 4 months status post second DEB TACE.   Referring Physician(s): Burr Medico  History of Present Illness: Megan Horton is a 75 y.o. female with moderately differentiated right hepatocellular carcinoma. Lesion is predominantly within segment 8. Patient is now 4 months status post second DEB TACE. She returns for outpatient follow-up and review of surveillance imaging. Over the last 3 months she has recovered. No current abdominal pain, flank pain, or epigastric pain. No signs of jaundice. No current physical limitations. She is at her baseline. Functional status remains very good.  Past Medical History:  Diagnosis Date  . Adhesive capsulitis of right shoulder   . Arthritis   . Complication of anesthesia 5/13   did have some disorientation post cerv fusion due to low sats  . Diabetes mellitus ([place])   . GERD (gastroesophageal reflux disease)   . Hyperlipemia   . Hypertension   . Hypothyroidism   . Left knee DJD   . Partial tear of rotator cuff(726.13) left  . Peripheral neuropathy ([facility])    fingers   . Peripheral vascular disease ([place])   . Seasonal allergies   . Seasonal allergies   . Sleep apnea    CPAP, sleep study 3-4 years ago in [place], [place] Dr. Elbert Ewings   . Urgency of urination   . Wears glasses     Past Surgical History:  Procedure Laterality Date  . ABDOMINAL HYSTERECTOMY     complete  . ANTERIOR CERVICAL DECOMP/DISCECTOMY FUSION  03/10/2012   Procedure: ANTERIOR CERVICAL DECOMPRESSION/DISCECTOMY FUSION 1 LEVEL;  Surgeon: Eustace Moore, MD;  Location: [facility];  Service: Neurosurgery;  Laterality: N/A;  Cervical five-six anterior cervical decompression fusion with plating  . CARPAL TUNNEL RELEASE     bilat  . CERVICAL FUSION    . CHOLECYSTECTOMY  2009  . COLONOSCOPY W/ POLYPECTOMY    . COLONOSCOPY WITH PROPOFOL  N/A 05/23/2015   Procedure: COLONOSCOPY WITH PROPOFOL;  Surgeon: Carol Ada, MD;  Location: [facility];  Service: Endoscopy;  Laterality: N/A;  . ESOPHAGOGASTRODUODENOSCOPY (EGD) WITH PROPOFOL N/A 10/10/2015   Procedure: ESOPHAGOGASTRODUODENOSCOPY (EGD) WITH PROPOFOL;  Surgeon: Carol Ada, MD;  Location: [facility];  Service: Endoscopy;  Laterality: N/A;  . EYE SURGERY  2012   bilat cataract  . HEEL SPUR EXCISION     bilat  . HEMORRHOID SURGERY  2009  . IR GENERIC HISTORICAL  05/31/2016   IR ANGIOGRAM SELECTIVE EACH ADDITIONAL VESSEL 05/31/2016 Greggory Keen, MD [facility]  . IR GENERIC HISTORICAL  05/31/2016   IR ANGIOGRAM VISCERAL SELECTIVE 05/31/2016 Greggory Keen, MD [facility]  . IR GENERIC HISTORICAL  05/31/2016   IR US GUIDE VASC ACCESS RIGHT 05/31/2016 Greggory Keen, MD [facility]  . IR GENERIC HISTORICAL  05/31/2016   IR ANGIOGRAM SELECTIVE EACH ADDITIONAL VESSEL 05/31/2016 Greggory Keen, MD [facility]  . IR GENERIC HISTORICAL  05/31/2016   IR EMBO TUMOR ORGAN ISCHEMIA INFARCT INC GUIDE ROADMAPPING 05/31/2016 Greggory Keen, MD [facility]  . KNEE ARTHROSCOPY     Left x2  . NOSE SURGERY    . SHOULDER ARTHROSCOPY  06/27/2012   Procedure: ARTHROSCOPY SHOULDER;  Surgeon: Lorn Junes, MD;  Location: [facility];  Service: Orthopedics;  Laterality: Right;  right shoulder arthroscopy debridement extensive, distal clavulectomy, with resect adhesions with maniplulation  . TONSILLECTOMY    . TOTAL  KNEE ARTHROPLASTY Left 07/02/2013   Procedure: TOTAL KNEE ARTHROPLASTY- left;  Surgeon: Lorn Junes, MD;  Location: [facility];  Service: Orthopedics;  Laterality: Left;  Marland Kitchen VARICOSE VEIN SURGERY     Right    Allergies: Asa [aspirin]; Adhesive [tape]; and Tylenol [acetaminophen]  Medications: Prior to Admission medications   Medication Sig Start Date End Date Taking? Authorizing Provider  amLODipine (NORVASC) 5 MG tablet Take 5 mg by mouth every morning.    Yes  Historical Provider, MD  calcium-vitamin D (OSCAL WITH D) 250-125 MG-UNIT tablet Take 1 tablet by mouth daily.   Yes Historical Provider, MD  cycloSPORINE (RESTASIS) 0.05 % ophthalmic emulsion Place 1 drop into both eyes 2 (two) times daily.   Yes Historical Provider, MD  dexlansoprazole (DEXILANT) 60 MG capsule Take 60 mg by mouth every evening.    Yes Historical Provider, MD  docusate sodium (COLACE) 100 MG capsule Take 1 capsule (100 mg total) by mouth daily as needed for mild constipation. 01/20/16  Yes Saverio Danker, PA-C  DULoxetine (CYMBALTA) 60 MG capsule Take 60 mg by mouth every evening.    Yes Historical Provider, MD  fluticasone (FLONASE) 50 MCG/ACT nasal spray Place 1 spray into both nostrils 2 (two) times daily as needed for allergies or rhinitis.    Yes Historical Provider, MD  furosemide (LASIX) 20 MG tablet Take 20 mg by mouth daily. Reported on 02/10/2016 10/20/15  Yes Historical Provider, MD  gabapentin (NEURONTIN) 300 MG capsule Take 300 mg by mouth at bedtime.    Yes Historical Provider, MD  hydrochlorothiazide (MICROZIDE) 12.5 MG capsule Take 12.5 mg by mouth daily.    Yes Historical Provider, MD  levothyroxine (SYNTHROID, LEVOTHROID) 112 MCG tablet Take 112 mcg by mouth daily. 12/22/15  Yes Historical Provider, MD  lovastatin (MEVACOR) 40 MG tablet Take 40 mg by mouth at bedtime.   Yes Historical Provider, MD  metFORMIN (GLUCOPHAGE-XR) 500 MG 24 hr tablet Take 500 mg by mouth daily. 05/12/16  Yes Historical Provider, MD  Multiple Vitamins-Minerals (CENTRUM SILVER PO) Take 1 tablet by mouth every morning.    Yes Historical Provider, MD  Olopatadine HCl (PATANASE) 0.6 % SOLN Place 1 each into the nose daily as needed (for allergies).   Yes Historical Provider, MD  Omega-3 Fatty Acids (FISH OIL PO) Take 1 tablet by mouth daily.   Yes Historical Provider, MD  ranitidine (ZANTAC) 150 MG tablet Take 150 mg by mouth at bedtime. Reported on 11/21/2015   Yes Historical Provider, MD    telmisartan (MICARDIS) 80 MG tablet Take 80 mg by mouth every morning.    Yes Historical Provider, MD  tetrahydrozoline (VISINE) 0.05 % ophthalmic solution Place 1 drop into both eyes 2 (two) times daily as needed (for allergies).    Yes Historical Provider, MD  esomeprazole (NEXIUM) 40 MG capsule Take 40 mg by mouth every morning.     Historical Provider, MD     Family History  Problem Relation Age of Onset  . Heart disease Mother   . Hypertension Mother   . Stroke Mother   . Heart disease Father   . Heart attack Father   . Hypertension Father   . Heart disease Brother   . Heart attack Brother   . Heart attack Sister   . Hypertension Sister   . Stroke Brother     Social History   Social History  . Marital status: Widowed    Spouse name: N/A  . Number of children: N/A  .  Years of education: N/A   Occupational History  . retired    Social History Main Topics  . Smoking status: Former Smoker    Packs/day: 0.50    Years: 35.00    Types: Cigarettes    Quit date: 12/26/1993  . Smokeless tobacco: Never Used  . Alcohol use Yes     Comment: very rarely wine, on holidays   . Drug use: No  . Sexual activity: Not on file   Other Topics Concern  . Not on file   Social History Narrative  . No narrative on file    ECOG Status: 1 - Symptomatic but completely ambulatory  Review of Systems: A 12 point ROS discussed and pertinent positives are indicated in the HPI above.  All other systems are negative.  Review of Systems  Vital Signs: BP (!) 147/73 (BP Location: Left Arm, Patient Position: Sitting, Cuff Size: Normal)   Pulse 64   Temp 98 F (36.7 C) (Oral)   Resp 16   Ht 5' 4.25" (1.632 m)   Wt 230 lb (104.3 kg)   SpO2 (!) 6%   BMI 39.17 kg/m   Physical Exam  Constitutional: She is oriented to person, place, and time. She appears well-developed and well-nourished. No distress.  Eyes: Conjunctivae are normal. No scleral icterus.  Cardiovascular: Normal rate,  regular rhythm and normal heart sounds.  Exam reveals no friction rub.   No murmur heard. Pulmonary/Chest: Effort normal and breath sounds normal. No respiratory distress. She has no wheezes.  Abdominal: Soft. Bowel sounds are normal. She exhibits no distension and no mass. There is no tenderness. There is no guarding.  Musculoskeletal: She exhibits no edema or tenderness.  Lymphadenopathy:    She has no cervical adenopathy.  Neurological: She is alert and oriented to person, place, and time.  Skin: Skin is warm and dry. She is not diaphoretic. No erythema.  Psychiatric: She has a normal mood and affect. Her behavior is normal. Judgment and thought content normal.    Mallampati Score:   2  Imaging: Mr Abdomen Wwo Contrast  Result Date: 10/05/2016 EXAM: MRI ABDOMEN WITHOUT AND WITH CONTRAST TECHNIQUE: Multiplanar multisequence MR imaging of the abdomen was performed both before and after the administration of intravenous contrast. CONTRAST:  10 mL Eovist COMPARISON:  None. FINDINGS: Lower chest: Lung bases are clear Hepatobiliary: Central tumor within the RIGHT hepatic lobe (segment 8) measures 4.9 x 3.4 cm compared to 4.6 x 3.5 cm remeasured. There is new tumor extension into the middle hepatic vein (image 29, series 503). No new lesions are present within the liver parenchyma. The portal veins are patent No ascites.  No periportal adenopathy. Pancreas:  No pancreatic duct dilatation.  No pancreatic lesion. Spleen: Spleen is enlarged measuring 15 cm craniocaudad dimension unchanged from prior. Adrenals/Urinary Tract: Adrenal glands kidneys are normal. Nonenhancing cyst of the RIGHT kidney Stomach/Bowel: Stomach limited view of the bowel is unremarkable. Vascular/Lymphatic: No retroperitoneal periportal lymphadenopathy. Splenic vein is patent. Other:  No ascites Musculoskeletal: No aggressive osseous lesion IMPRESSION: 1. Mild interval increase in size of central RIGHT hepatic lobe mass. 2.  Interval extension of the tumor mass into the middle hepatic vein consistent with tumor progression. 3. Splenomegaly.  Portal vein is patent.  No ascites. Findings conveyed toMICHAEL Isha Seefeld on 10/05/2016  at12:13. Electronically Signed   By: Suzy Bouchard M.D.   On: 10/05/2016 12:13    Labs:  CBC:  Recent Labs  01/19/16 0930 01/20/16 0522 04/12/16 1407 05/31/16 1025  WBC 5.0 8.1 6.6 4.1  HGB 12.2 13.0 12.9 11.8*  HCT 37.0 41.1 40.1 38.2  PLT 109* 123* 100* 90*    COAGS:  Recent Labs  11/21/15 1259 01/19/16 0930 05/31/16 1025  INR 1.0 0.94 1.05  APTT  --  30 31    BMP:  Recent Labs  10/07/15 1438  01/19/16 0930 01/20/16 0522 04/12/16 1407 05/31/16 1025 10/05/16 1029  NA 143  < > 143 141 143 140  --   K 3.6  < > 3.7 3.7 3.8 4.0  --   CL 107  --  107 106  --  106  --   CO2 30  < > 26 25 28 26   --   GLUCOSE 109*  < > 140* 163* 163* 211*  --   BUN 9  < > 16 12 8.2 16  --   CALCIUM 9.2  < > 8.8* 8.8* 9.6 8.5*  --   CREATININE 0.64  < > 0.68 0.57 0.8 0.80 0.70  GFRNONAA >60  --  >60 >60  --  >60  --   GFRAA >60  --  >60 >60  --  >60  --   < > = values in this interval not displayed.  LIVER FUNCTION TESTS:  Recent Labs  01/19/16 0930 01/20/16 0522 04/12/16 1407 05/31/16 1025  BILITOT 0.4 0.6 0.81 1.0  AST 25 27 32 54*  ALT 28 30 42 60*  ALKPHOS 113 101 152* 142*  PROT 6.9 6.6 7.4 6.4*  ALBUMIN 4.0 3.8 3.7 3.6    TUMOR MARKERS: No results for input(s): AFPTM, CEA, CA199, CHROMGRNA in the last 8760 hours.  Assessment and Plan:  4 months status post second DEB TACE for moderately differentiated hepatocarcinoma carcinoma. She has recovered very well from the procedure. No signs of delayed embolic syndrome. No current abdominal pain or limitations. She is back to her baseline and has a excellent function status. No signs of jaundice or liver failure.  Follow-up imaging today unfortunately demonstrates slight increase in size of the dominant central  hepatic lesion with tumor or bland thrombus now extending into the middle hepatic vein.  Treatment options were reviewed including additional DEB TACE, bland embolization, or Y 90 radio embolization. The current lesion size and location is not amenable to microwave ablation. After discussion, she would like to proceed with Y 90 radio embolization.  Y 90 embolization was reviewed in detail including the preprocedure angiograms, visceral mapping, and hepatic to lung shunt calculation as well as embolization of nontarget vasculature. She has a clear understanding of the procedure. All questions were addressed. She would like to proceed with switching to Y 90 radio embolization.  Anticipate initial visceral angiogram and pretreatment evaluation in the next few weeks. Hopefully plan for right hepatic Y 90 radio embolization in January 2018.  Thank you for this interesting consult.  I greatly enjoyed meeting Megan Horton and look forward to participating in their care.  A copy of this report was sent to the requesting provider on this date.  Electronically Signed: Greggory Keen 10/05/2016, 2:03 PM   I spent a total of    40 Minutes in face to face in clinical consultation, greater than 50% of which was counseling/coordinating care for this patient with hepatocellular carcinoma showing progression despite to [facility] TACE procedures.

## 2016-10-06 ENCOUNTER — Other Ambulatory Visit (HOSPITAL_COMMUNITY): Payer: Self-pay | Admitting: Interventional Radiology

## 2016-10-06 DIAGNOSIS — M8580 Other specified disorders of bone density and structure, unspecified site: Secondary | ICD-10-CM | POA: Diagnosis not present

## 2016-10-06 DIAGNOSIS — M79643 Pain in unspecified hand: Secondary | ICD-10-CM | POA: Diagnosis not present

## 2016-10-06 DIAGNOSIS — M064 Inflammatory polyarthropathy: Secondary | ICD-10-CM | POA: Diagnosis not present

## 2016-10-06 DIAGNOSIS — M65841 Other synovitis and tenosynovitis, right hand: Secondary | ICD-10-CM | POA: Diagnosis not present

## 2016-10-06 DIAGNOSIS — C220 Liver cell carcinoma: Secondary | ICD-10-CM

## 2016-10-07 ENCOUNTER — Other Ambulatory Visit (HOSPITAL_COMMUNITY): Payer: Self-pay | Admitting: Interventional Radiology

## 2016-10-07 DIAGNOSIS — C220 Liver cell carcinoma: Secondary | ICD-10-CM

## 2016-10-15 ENCOUNTER — Other Ambulatory Visit: Payer: Self-pay | Admitting: General Surgery

## 2016-10-18 ENCOUNTER — Encounter (HOSPITAL_COMMUNITY): Payer: Self-pay

## 2016-10-18 ENCOUNTER — Encounter (HOSPITAL_COMMUNITY)
Admission: RE | Admit: 2016-10-18 | Discharge: 2016-10-18 | Disposition: A | Discharge status: Home / Self Care | Payer: Medicare Other | Source: Ambulatory Visit | Attending: Interventional Radiology | Admitting: Interventional Radiology

## 2016-10-18 ENCOUNTER — Ambulatory Visit (HOSPITAL_COMMUNITY)
Admission: RE | Admit: 2016-10-18 | Discharge: 2016-10-18 | Disposition: A | Discharge status: Home / Self Care | Payer: Medicare Other | Source: Ambulatory Visit | Attending: Interventional Radiology | Admitting: Interventional Radiology

## 2016-10-18 ENCOUNTER — Other Ambulatory Visit (HOSPITAL_COMMUNITY): Payer: Self-pay | Admitting: Interventional Radiology

## 2016-10-18 DIAGNOSIS — Z79899 Other long term (current) drug therapy: Secondary | ICD-10-CM | POA: Insufficient documentation

## 2016-10-18 DIAGNOSIS — E785 Hyperlipidemia, unspecified: Secondary | ICD-10-CM | POA: Diagnosis not present

## 2016-10-18 DIAGNOSIS — G4730 Sleep apnea, unspecified: Secondary | ICD-10-CM | POA: Diagnosis not present

## 2016-10-18 DIAGNOSIS — Z7984 Long term (current) use of oral hypoglycemic drugs: Secondary | ICD-10-CM | POA: Diagnosis not present

## 2016-10-18 DIAGNOSIS — K7460 Unspecified cirrhosis of liver: Secondary | ICD-10-CM | POA: Diagnosis not present

## 2016-10-18 DIAGNOSIS — C220 Liver cell carcinoma: Secondary | ICD-10-CM

## 2016-10-18 DIAGNOSIS — E1151 Type 2 diabetes mellitus with diabetic peripheral angiopathy without gangrene: Secondary | ICD-10-CM | POA: Insufficient documentation

## 2016-10-18 DIAGNOSIS — I10 Essential (primary) hypertension: Secondary | ICD-10-CM | POA: Diagnosis not present

## 2016-10-18 DIAGNOSIS — C228 Malignant neoplasm of liver, primary, unspecified as to type: Secondary | ICD-10-CM | POA: Diagnosis not present

## 2016-10-18 DIAGNOSIS — E039 Hypothyroidism, unspecified: Secondary | ICD-10-CM | POA: Diagnosis not present

## 2016-10-18 DIAGNOSIS — M8580 Other specified disorders of bone density and structure, unspecified site: Secondary | ICD-10-CM | POA: Diagnosis not present

## 2016-10-18 DIAGNOSIS — K219 Gastro-esophageal reflux disease without esophagitis: Secondary | ICD-10-CM | POA: Insufficient documentation

## 2016-10-18 DIAGNOSIS — G629 Polyneuropathy, unspecified: Secondary | ICD-10-CM | POA: Insufficient documentation

## 2016-10-18 HISTORY — DX: Other specified disorders of bone density and structure, unspecified site: M85.80

## 2016-10-18 HISTORY — PX: IR GENERIC HISTORICAL: IMG1180011

## 2016-10-18 LAB — CBC WITH DIFFERENTIAL/PLATELET
BASOS ABS: 0 10*3/uL (ref 0.0–0.1)
Basophils Relative: 1 %
EOS PCT: 10 %
Eosinophils Absolute: 0.3 10*3/uL (ref 0.0–0.7)
HEMATOCRIT: 36.8 % (ref 36.0–46.0)
Hemoglobin: 11.8 g/dL — ABNORMAL LOW (ref 12.0–15.0)
LYMPHS ABS: 0.5 10*3/uL — AB (ref 0.7–4.0)
LYMPHS PCT: 19 %
MCH: 29.1 pg (ref 26.0–34.0)
MCHC: 32.1 g/dL (ref 30.0–36.0)
MCV: 90.9 fL (ref 78.0–100.0)
MONO ABS: 0.3 10*3/uL (ref 0.1–1.0)
MONOS PCT: 12 %
NEUTROS ABS: 1.5 10*3/uL — AB (ref 1.7–7.7)
Neutrophils Relative %: 58 %
PLATELETS: 82 10*3/uL — AB (ref 150–400)
RBC: 4.05 MIL/uL (ref 3.87–5.11)
RDW: 14.8 % (ref 11.5–15.5)
WBC: 2.6 10*3/uL — ABNORMAL LOW (ref 4.0–10.5)

## 2016-10-18 LAB — COMPREHENSIVE METABOLIC PANEL
ALT: 43 U/L (ref 14–54)
AST: 50 U/L — AB (ref 15–41)
Albumin: 3.6 g/dL (ref 3.5–5.0)
Alkaline Phosphatase: 133 U/L — ABNORMAL HIGH (ref 38–126)
Anion gap: 7 (ref 5–15)
BILIRUBIN TOTAL: 0.7 mg/dL (ref 0.3–1.2)
BUN: 13 mg/dL (ref 6–20)
CHLORIDE: 105 mmol/L (ref 101–111)
CO2: 25 mmol/L (ref 22–32)
CREATININE: 0.7 mg/dL (ref 0.44–1.00)
Calcium: 9.2 mg/dL (ref 8.9–10.3)
Glucose, Bld: 169 mg/dL — ABNORMAL HIGH (ref 65–99)
POTASSIUM: 3.7 mmol/L (ref 3.5–5.1)
Sodium: 137 mmol/L (ref 135–145)
TOTAL PROTEIN: 6.9 g/dL (ref 6.5–8.1)

## 2016-10-18 LAB — PROTIME-INR
INR: 1.05
PROTHROMBIN TIME: 13.8 s (ref 11.4–15.2)

## 2016-10-18 LAB — GLUCOSE, CAPILLARY: Glucose-Capillary: 177 mg/dL — ABNORMAL HIGH (ref 65–99)

## 2016-10-18 MED ORDER — LIDOCAINE HCL 1 % IJ SOLN
INTRAMUSCULAR | Status: AC
  Filled 2016-10-18: qty 20

## 2016-10-18 MED ORDER — IOPAMIDOL (ISOVUE-300) INJECTION 61%
100.0000 mL | Freq: Once | INTRAVENOUS | Status: AC | PRN
  Administered 2016-10-18: 58 mL via INTRA_ARTERIAL

## 2016-10-18 MED ORDER — MIDAZOLAM HCL 2 MG/2ML IJ SOLN
INTRAMUSCULAR | Status: AC
  Filled 2016-10-18: qty 6

## 2016-10-18 MED ORDER — IOPAMIDOL (ISOVUE-300) INJECTION 61%
100.0000 mL | Freq: Once | INTRAVENOUS | Status: AC | PRN
  Administered 2016-10-18: 65 mL via INTRA_ARTERIAL

## 2016-10-18 MED ORDER — IOPAMIDOL (ISOVUE-300) INJECTION 61%
INTRAVENOUS | Status: AC
  Filled 2016-10-18: qty 50

## 2016-10-18 MED ORDER — SODIUM CHLORIDE 0.9 % IV SOLN
INTRAVENOUS | Status: DC
  Administered 2016-10-18: 09:00:00 via INTRAVENOUS

## 2016-10-18 MED ORDER — TECHNETIUM TO 99M ALBUMIN AGGREGATED
5.1000 | Freq: Once | INTRAVENOUS | Status: AC | PRN
  Administered 2016-10-18: 5.1 via INTRAVENOUS

## 2016-10-18 MED ORDER — FENTANYL CITRATE (PF) 100 MCG/2ML IJ SOLN
INTRAMUSCULAR | Status: AC
  Filled 2016-10-18: qty 4

## 2016-10-18 MED ORDER — FENTANYL CITRATE (PF) 100 MCG/2ML IJ SOLN
INTRAMUSCULAR | Status: AC | PRN
  Administered 2016-10-18: 50 ug via INTRAVENOUS
  Administered 2016-10-18 (×3): 25 ug via INTRAVENOUS

## 2016-10-18 MED ORDER — IOPAMIDOL (ISOVUE-300) INJECTION 61%
INTRAVENOUS | Status: AC
  Filled 2016-10-18: qty 300

## 2016-10-18 MED ORDER — MIDAZOLAM HCL 2 MG/2ML IJ SOLN
INTRAMUSCULAR | Status: AC | PRN
  Administered 2016-10-18 (×4): 1 mg via INTRAVENOUS

## 2016-10-18 NOTE — Discharge Instructions (Signed)
Femoral Site Care Introduction Refer to this sheet in the next few weeks. These instructions provide you with information about caring for yourself after your procedure. Your health care provider may also give you more specific instructions. Your treatment has been planned according to current medical practices, but problems sometimes occur. Call your health care provider if you have any problems or questions after your procedure. What can I expect after the procedure? After your procedure, it is typical to have the following:  Bruising at the site that usually fades within 1-2 weeks.  Blood collecting in the tissue (hematoma) that may be painful to the touch. It should usually decrease in size and tenderness within 1-2 weeks. Follow these instructions at home:  Take medicines only as directed by your health care provider.  You may shower 24-48 hours after the procedure or as directed by your health care provider. Remove the bandage (dressing) and gently wash the site with plain soap and water. Pat the area dry with a clean towel. Do not rub the site, because this may cause bleeding.  Do not take baths, swim, or use a hot tub until your health care provider approves.  Check your insertion site every day for redness, swelling, or drainage.  Do not apply powder or lotion to the site.  Limit use of stairs to twice a day for the first 2-3 days or as directed by your health care provider.  Do not squat for the first 2-3 days or as directed by your health care provider.  Do not lift over 10 lb (4.5 kg) for 5 days after your procedure or as directed by your health care provider.  Ask your health care provider when it is okay to:  Return to work or school.  Resume usual physical activities or sports.  Resume sexual activity.  Do not drive home if you are discharged the same day as the procedure. Have someone else drive you.  You may drive 24 hours after the procedure unless otherwise  instructed by your health care provider.  Do not operate machinery or power tools for 24 hours after the procedure or as directed by your health care provider.  If your procedure was done as an outpatient procedure, which means that you went home the same day as your procedure, a responsible adult should be with you for the first 24 hours after you arrive home.  Keep all follow-up visits as directed by your health care provider. This is important. Contact a health care provider if:  You have a fever.  You have chills.  You have increased bleeding from the site. Hold pressure on the site. Get help right away if:  You have unusual pain at the site.  You have redness, warmth, or swelling at the site.  You have drainage (other than a small amount of blood on the dressing) from the site.  The site is bleeding, and the bleeding does not stop after 30 minutes of holding steady pressure on the site.  Your leg or foot becomes pale, cool, tingly, or numb. This information is not intended to replace advice given to you by your health care provider. Make sure you discuss any questions you have with your health care provider. Document Released: 06/28/2014 Document Revised: 04/01/2016 Document Reviewed: 05/14/2014  2017 Elsevier   Moderate Conscious Sedation, Adult, Care After These instructions provide you with information about caring for yourself after your procedure. Your health care provider may also give you more specific instructions. Your  treatment has been planned according to current medical practices, but problems sometimes occur. Call your health care provider if you have any problems or questions after your procedure. What can I expect after the procedure? After your procedure, it is common:  To feel sleepy for several hours.  To feel clumsy and have poor balance for several hours.  To have poor judgment for several hours.  To vomit if you eat too soon. Follow these  instructions at home: For at least 24 hours after the procedure:   Do not:  Participate in activities where you could fall or become injured.  Drive.  Use heavy machinery.  Drink alcohol.  Take sleeping pills or medicines that cause drowsiness.  Make important decisions or sign legal documents.  Take care of children on your own.  Rest. Eating and drinking  Follow the diet recommended by your health care provider.  If you vomit:  Drink water, juice, or soup when you can drink without vomiting.  Make sure you have little or no nausea before eating solid foods. General instructions  Have a responsible adult stay with you until you are awake and alert.  Take over-the-counter and prescription medicines only as told by your health care provider.  If you smoke, do not smoke without supervision.  Keep all follow-up visits as told by your health care provider. This is important. Contact a health care provider if:  You keep feeling nauseous or you keep vomiting.  You feel light-headed.  You develop a rash.  You have a fever. Get help right away if:  You have trouble breathing. This information is not intended to replace advice given to you by your health care provider. Make sure you discuss any questions you have with your health care provider. Document Released: 08/15/2013 Document Revised: 03/29/2016 Document Reviewed: 02/14/2016 Elsevier Interactive Patient Education  2017 [place] Radioembolization Discharge Instructions  You have been given a radioactive material during your procedure.  While it is safe for you to be discharged home from the hospital, you need to proceed directly home.    Do not use public transportation, including air travel, lasting more than 2 hours for 1 week.  Avoid crowded public places for 1 week.  Adult visitors should try to avoid close contact with you for 1 week.    Children and pregnant females should not  visit or have close contact with you for 1 week.  Items that you touch are not radioactive.  Do not sleep in the same bed as your partner for 1 week, and a condom should be used for sexual activity during the first 24 hours.  Your blood may be radioactive and caution should be used if any bleeding occurs during the recovery period.  Body fluids may be radioactive for 24 hours.  Wash your hands after voiding.  Men should sit to urinate.  Dispose of any soiled materials (flush down toilet or place in trash at home) during the first day.  Drink 6 to 8 glasses of fluids per day for 5 days to hydrate yourself.  If you need to see a doctor during the first week, you must let them know that you were treated with yttrium-90 microspheres, and will be slightly radioactive.  They can call Interventional Radiology [phone] with any questions.

## 2016-10-18 NOTE — Procedures (Signed)
[place]  S/p pre Y90 hepatic angio, GDA embo, and MAA injection for shunt calculation  No comp Stable Full report in PACS  EBL <5cc

## 2016-10-18 NOTE — H&P (Signed)
Referring Physician(s): Feng,Y  Supervising Physician: Daryll Brod  Patient Status: [facility]  Chief Complaint:  Hepatocellular carcinoma  Subjective: Patient familiar to IR  service from prior liver lesion biopsy in January 2017. She has a known hx of cirrhosis and hepatic segment 8 HCC. She is s/p right hepatic  HCC DEB-TACE X2 , most recently in July 2017. Follow-up MRI of the abdomen on 10/05/16 revealed mild interval increase in size of the central right hepatic lobe HCC with interval extension of tumor into the middle hepatic vein. She has been seen by Dr. Annamaria Boots in follow-up and deemed an appropriate candidate for Y 90 hepatic radioembolization. She presents again today for pre-Y 90 arterial  Roadmapping/embolization.She currently denies fever, headache, chest pain, dyspnea, abdominal pain, vomiting or abnormal bleeding. She does have some occasional nausea with eating. Past Medical History:  Diagnosis Date  . Adhesive capsulitis of right shoulder   . Arthritis   . Complication of anesthesia 5/13   did have some disorientation post cerv fusion due to low sats  . Diabetes mellitus ([place])   . GERD (gastroesophageal reflux disease)   . Hyperlipemia   . Hypertension   . Hypothyroidism   . Left knee DJD   . Osteopenia   . Partial tear of rotator cuff(726.13) left  . Peripheral neuropathy (HCC)    fingers   . Peripheral vascular disease ([place])   . Seasonal allergies   . Seasonal allergies   . Sleep apnea    CPAP, sleep study 3-4 years ago in [place], [place] Dr. Elbert Ewings   . Urgency of urination   . Wears glasses    Past Surgical History:  Procedure Laterality Date  . ABDOMINAL HYSTERECTOMY     complete  . ANTERIOR CERVICAL DECOMP/DISCECTOMY FUSION  03/10/2012   Procedure: ANTERIOR CERVICAL DECOMPRESSION/DISCECTOMY FUSION 1 LEVEL;  Surgeon: Eustace Moore, MD;  Location: [place] [facility];  Service: Neurosurgery;  Laterality: N/A;  Cervical five-six anterior cervical  decompression fusion with plating  . CARPAL TUNNEL RELEASE     bilat  . CERVICAL FUSION    . CHOLECYSTECTOMY  2009  . COLONOSCOPY W/ POLYPECTOMY    . COLONOSCOPY WITH PROPOFOL N/A 05/23/2015   Procedure: COLONOSCOPY WITH PROPOFOL;  Surgeon: Carol Ada, MD;  [facility];  Service: Endoscopy;  Laterality: N/A;  . ESOPHAGOGASTRODUODENOSCOPY (EGD) WITH PROPOFOL N/A 10/10/2015   Procedure: ESOPHAGOGASTRODUODENOSCOPY (EGD) WITH PROPOFOL;  Surgeon: Carol Ada, MD;  [facility];  Service: Endoscopy;  Laterality: N/A;  . EYE SURGERY  2012   bilat cataract  . HEEL SPUR EXCISION     bilat  . HEMORRHOID SURGERY  2009  . IR GENERIC HISTORICAL  05/31/2016   IR ANGIOGRAM SELECTIVE EACH ADDITIONAL VESSEL 05/31/2016 Greggory Keen, MD [facility]  . IR GENERIC HISTORICAL  05/31/2016   IR ANGIOGRAM VISCERAL SELECTIVE 05/31/2016 Greggory Keen, MD [facility]  . IR GENERIC HISTORICAL  05/31/2016   IR US GUIDE VASC ACCESS RIGHT 05/31/2016 Greggory Keen, MD [facility]  . IR GENERIC HISTORICAL  05/31/2016   IR ANGIOGRAM SELECTIVE EACH ADDITIONAL VESSEL 05/31/2016 Greggory Keen, MD [facility]  . IR GENERIC HISTORICAL  05/31/2016   IR EMBO TUMOR ORGAN ISCHEMIA INFARCT INC GUIDE ROADMAPPING 05/31/2016 Greggory Keen, MD [facility]  . KNEE ARTHROSCOPY     Left x2  . NOSE SURGERY    . SHOULDER ARTHROSCOPY  06/27/2012   Procedure: ARTHROSCOPY SHOULDER;  Surgeon: Lorn Junes, MD;  Location: [facility];  Service: Orthopedics;  Laterality: Right;  right shoulder arthroscopy debridement extensive, distal clavulectomy, with resect adhesions with maniplulation  . TONSILLECTOMY    . TOTAL KNEE ARTHROPLASTY Left 07/02/2013   Procedure: TOTAL KNEE ARTHROPLASTY- left;  Surgeon: Lorn Junes, MD;  Location: [facility];  Service: Orthopedics;  Laterality: Left;  Marland Kitchen VARICOSE VEIN SURGERY     Right     Allergies: Asa [aspirin]; Adhesive [tape]; and Tylenol  [acetaminophen]  Medications: Prior to Admission medications   Medication Sig Start Date End Date Taking? Authorizing Provider  amLODipine (NORVASC) 5 MG tablet Take 5 mg by mouth every morning.    Yes Historical Provider, MD  calcium-vitamin D (OSCAL WITH D) 250-125 MG-UNIT tablet Take 1 tablet by mouth daily.   Yes Historical Provider, MD  cycloSPORINE (RESTASIS) 0.05 % ophthalmic emulsion Place 1 drop into both eyes 2 (two) times daily.   Yes Historical Provider, MD  dexlansoprazole (DEXILANT) 60 MG capsule Take 60 mg by mouth every evening.    Yes Historical Provider, MD  docusate sodium (COLACE) 100 MG capsule Take 1 capsule (100 mg total) by mouth daily as needed for mild constipation. 01/20/16  Yes Saverio Danker, PA-C  DULoxetine (CYMBALTA) 60 MG capsule Take 60 mg by mouth every evening.    Yes Historical Provider, MD  fluticasone (FLONASE) 50 MCG/ACT nasal spray Place 1 spray into both nostrils 2 (two) times daily as needed for allergies or rhinitis.    Yes Historical Provider, MD  furosemide (LASIX) 20 MG tablet Take 20 mg by mouth daily. Reported on 02/10/2016 10/20/15  Yes Historical Provider, MD  gabapentin (NEURONTIN) 300 MG capsule Take 300 mg by mouth at bedtime.    Yes Historical Provider, MD  hydrochlorothiazide (MICROZIDE) 12.5 MG capsule Take 12.5 mg by mouth daily.    Yes Historical Provider, MD  levothyroxine (SYNTHROID, LEVOTHROID) 112 MCG tablet Take 112 mcg by mouth daily. 12/22/15  Yes Historical Provider, MD  lovastatin (MEVACOR) 40 MG tablet Take 40 mg by mouth at bedtime.   Yes Historical Provider, MD  metFORMIN (GLUCOPHAGE-XR) 500 MG 24 hr tablet Take 500 mg by mouth daily. 05/12/16  Yes Historical Provider, MD  Multiple Vitamins-Minerals (CENTRUM SILVER PO) Take 1 tablet by mouth every morning.    Yes Historical Provider, MD  Olopatadine HCl (PATANASE) 0.6 % SOLN Place 1 each into the nose daily as needed (for allergies).   Yes Historical Provider, MD  Omega-3 Fatty Acids  (FISH OIL PO) Take 1 tablet by mouth daily.   Yes Historical Provider, MD  ranitidine (ZANTAC) 150 MG tablet Take 150 mg by mouth at bedtime. Reported on 11/21/2015   Yes Historical Provider, MD  telmisartan (MICARDIS) 80 MG tablet Take 80 mg by mouth every morning.    Yes Historical Provider, MD  tetrahydrozoline (VISINE) 0.05 % ophthalmic solution Place 1 drop into both eyes 2 (two) times daily as needed (for allergies).    Yes Historical Provider, MD  esomeprazole (NEXIUM) 40 MG capsule Take 40 mg by mouth every morning.     Historical Provider, MD     Vital Signs: BP (!) 142/70   Pulse 73   Temp 97.7 F (36.5 C)   SpO2 99%   Physical Exam Patient awake, alert. Chest clear to auscultation bilaterally. Heart with regular rate and rhythm. Abdomen obese, soft, positive bowel sounds, nontender. Lower extremities with full range of motion.  Imaging: No results found.  Labs:  CBC:  Recent Labs  01/19/16 0930 01/20/16 0522 04/12/16  1407 05/31/16 1025  WBC 5.0 8.1 6.6 4.1  HGB 12.2 13.0 12.9 11.8*  HCT 37.0 41.1 40.1 38.2  PLT 109* 123* 100* 90*    COAGS:  Recent Labs  11/21/15 1259 01/19/16 0930 05/31/16 1025  INR 1.0 0.94 1.05  APTT  --  30 31    BMP:  Recent Labs  01/19/16 0930 01/20/16 0522 04/12/16 1407 05/31/16 1025 10/05/16 1029  NA 143 141 143 140  --   K 3.7 3.7 3.8 4.0  --   CL 107 106  --  106  --   CO2 26 25 28 26   --   GLUCOSE 140* 163* 163* 211*  --   BUN 16 12 8.2 16  --   CALCIUM 8.8* 8.8* 9.6 8.5*  --   CREATININE 0.68 0.57 0.8 0.80 0.70  GFRNONAA >60 >60  --  >60  --   GFRAA >60 >60  --  >60  --     LIVER FUNCTION TESTS:  Recent Labs  01/19/16 0930 01/20/16 0522 04/12/16 1407 05/31/16 1025  BILITOT 0.4 0.6 0.81 1.0  AST 25 27 32 54*  ALT 28 30 42 60*  ALKPHOS 113 101 152* 142*  PROT 6.9 6.6 7.4 6.4*  ALBUMIN 4.0 3.8 3.7 3.6    Assessment and Plan: Pt with known hx of cirrhosis and hepatic segment 8 HCC. She is s/p right  hepatic  HCC DEB-TACE X2 , most recently in July 2017. Follow-up MRI of the abdomen on 10/05/16 revealed mild interval increase in size of the central right hepatic lobe HCC with interval extension of tumor into the middle hepatic vein. She has been seen by Dr. Annamaria Boots in follow-up and deemed an appropriate candidate for Y 90 hepatic radioembolization. She presents again today for pre-Y 90 arterial  Roadmapping/embolization.Details/risks of procedure, including but not limited to, internal bleeding, infection, contrast nephropathy,nontarget embolization discussed with patient and family with their understanding and consent. LABS PENDING.   Electronically Signed: D. Rowe Robert 10/18/2016, 8:35 AM   I spent a total of 25 minutes  at the the patient's bedside AND on the patient's hospital floor or unit, greater than 50% of which was counseling/coordinating care for hepatic/visceral arteriogram with possible embolization/test Y 90 dosing

## 2016-10-18 NOTE — Sedation Documentation (Signed)
Pt transported to short stay for recovery. 

## 2016-10-18 NOTE — Sedation Documentation (Signed)
5 Fr sheath removed from R femoral artery by Dr. Annamaria Boots. Hemostasis achieved using Exoseal closure device.  Groin level 0, 2+RDP.

## 2016-10-18 NOTE — Sedation Documentation (Signed)
Gauze tegaderm bandage applied to femoral puncture.  Stie CDI. Pt transported to nuc med on [facility] [facility].

## 2016-10-27 ENCOUNTER — Encounter: Payer: Self-pay | Admitting: Interventional Radiology

## 2016-11-02 ENCOUNTER — Other Ambulatory Visit (HOSPITAL_COMMUNITY): Payer: Medicare Other

## 2016-11-02 ENCOUNTER — Other Ambulatory Visit: Payer: Self-pay | Admitting: Student

## 2016-11-02 ENCOUNTER — Other Ambulatory Visit: Payer: Self-pay | Admitting: Radiology

## 2016-11-02 ENCOUNTER — Ambulatory Visit (HOSPITAL_COMMUNITY): Payer: Medicare Other

## 2016-11-03 ENCOUNTER — Ambulatory Visit (HOSPITAL_COMMUNITY)
Admission: RE | Admit: 2016-11-03 | Discharge: 2016-11-03 | Disposition: A | Discharge status: Home / Self Care | Payer: Medicare Other | Source: Ambulatory Visit | Attending: Interventional Radiology | Admitting: Interventional Radiology

## 2016-11-03 ENCOUNTER — Encounter (HOSPITAL_COMMUNITY)
Admission: RE | Admit: 2016-11-03 | Discharge: 2016-11-03 | Disposition: A | Discharge status: Home / Self Care | Payer: Medicare Other | Source: Ambulatory Visit | Attending: Interventional Radiology | Admitting: Interventional Radiology

## 2016-11-03 ENCOUNTER — Other Ambulatory Visit (HOSPITAL_COMMUNITY): Payer: Self-pay | Admitting: Interventional Radiology

## 2016-11-03 ENCOUNTER — Encounter (HOSPITAL_COMMUNITY): Payer: Self-pay

## 2016-11-03 DIAGNOSIS — E1151 Type 2 diabetes mellitus with diabetic peripheral angiopathy without gangrene: Secondary | ICD-10-CM | POA: Insufficient documentation

## 2016-11-03 DIAGNOSIS — R3915 Urgency of urination: Secondary | ICD-10-CM | POA: Diagnosis not present

## 2016-11-03 DIAGNOSIS — C220 Liver cell carcinoma: Secondary | ICD-10-CM | POA: Insufficient documentation

## 2016-11-03 DIAGNOSIS — Z8249 Family history of ischemic heart disease and other diseases of the circulatory system: Secondary | ICD-10-CM | POA: Insufficient documentation

## 2016-11-03 DIAGNOSIS — E785 Hyperlipidemia, unspecified: Secondary | ICD-10-CM | POA: Diagnosis not present

## 2016-11-03 DIAGNOSIS — M8580 Other specified disorders of bone density and structure, unspecified site: Secondary | ICD-10-CM | POA: Diagnosis not present

## 2016-11-03 DIAGNOSIS — E039 Hypothyroidism, unspecified: Secondary | ICD-10-CM | POA: Diagnosis not present

## 2016-11-03 DIAGNOSIS — E1142 Type 2 diabetes mellitus with diabetic polyneuropathy: Secondary | ICD-10-CM | POA: Insufficient documentation

## 2016-11-03 DIAGNOSIS — M1712 Unilateral primary osteoarthritis, left knee: Secondary | ICD-10-CM | POA: Insufficient documentation

## 2016-11-03 DIAGNOSIS — Z886 Allergy status to analgesic agent status: Secondary | ICD-10-CM | POA: Diagnosis not present

## 2016-11-03 DIAGNOSIS — C787 Secondary malignant neoplasm of liver and intrahepatic bile duct: Secondary | ICD-10-CM | POA: Diagnosis not present

## 2016-11-03 DIAGNOSIS — K219 Gastro-esophageal reflux disease without esophagitis: Secondary | ICD-10-CM | POA: Insufficient documentation

## 2016-11-03 DIAGNOSIS — Z823 Family history of stroke: Secondary | ICD-10-CM | POA: Insufficient documentation

## 2016-11-03 DIAGNOSIS — Z96652 Presence of left artificial knee joint: Secondary | ICD-10-CM | POA: Insufficient documentation

## 2016-11-03 DIAGNOSIS — G4730 Sleep apnea, unspecified: Secondary | ICD-10-CM | POA: Diagnosis not present

## 2016-11-03 DIAGNOSIS — I10 Essential (primary) hypertension: Secondary | ICD-10-CM | POA: Diagnosis not present

## 2016-11-03 DIAGNOSIS — Z981 Arthrodesis status: Secondary | ICD-10-CM | POA: Diagnosis not present

## 2016-11-03 DIAGNOSIS — Z87891 Personal history of nicotine dependence: Secondary | ICD-10-CM | POA: Diagnosis not present

## 2016-11-03 HISTORY — PX: IR GENERIC HISTORICAL: IMG1180011

## 2016-11-03 LAB — COMPREHENSIVE METABOLIC PANEL
ALBUMIN: 3.4 g/dL — AB (ref 3.5–5.0)
ALT: 41 U/L (ref 14–54)
AST: 39 U/L (ref 15–41)
Alkaline Phosphatase: 138 U/L — ABNORMAL HIGH (ref 38–126)
Anion gap: 11 (ref 5–15)
BUN: 14 mg/dL (ref 6–20)
CHLORIDE: 105 mmol/L (ref 101–111)
CO2: 24 mmol/L (ref 22–32)
CREATININE: 0.64 mg/dL (ref 0.44–1.00)
Calcium: 8.9 mg/dL (ref 8.9–10.3)
GFR calc Af Amer: >60 mL/min (ref >60)
Glucose, Bld: 161 mg/dL — ABNORMAL HIGH (ref 65–99)
POTASSIUM: 3.7 mmol/L (ref 3.5–5.1)
SODIUM: 140 mmol/L (ref 135–145)
Total Bilirubin: 0.7 mg/dL (ref 0.3–1.2)
Total Protein: 6.9 g/dL (ref 6.5–8.1)

## 2016-11-03 LAB — PROTIME-INR
INR: 1.06
PROTHROMBIN TIME: 13.8 s (ref 11.4–15.2)

## 2016-11-03 LAB — CBC WITH DIFFERENTIAL/PLATELET
BASOS PCT: 1 %
Basophils Absolute: 0 10*3/uL (ref 0.0–0.1)
EOS ABS: 0.2 10*3/uL (ref 0.0–0.7)
EOS PCT: 6 %
HCT: 34.8 % — ABNORMAL LOW (ref 36.0–46.0)
Hemoglobin: 11.3 g/dL — ABNORMAL LOW (ref 12.0–15.0)
LYMPHS PCT: 20 %
Lymphs Abs: 0.8 10*3/uL (ref 0.7–4.0)
MCH: 28.9 pg (ref 26.0–34.0)
MCHC: 32.5 g/dL (ref 30.0–36.0)
MCV: 89 fL (ref 78.0–100.0)
MONO ABS: 0.6 10*3/uL (ref 0.1–1.0)
Monocytes Relative: 14 %
Neutro Abs: 2.4 10*3/uL (ref 1.7–7.7)
Neutrophils Relative %: 60 %
PLATELETS: 89 10*3/uL — AB (ref 150–400)
RBC: 3.91 MIL/uL (ref 3.87–5.11)
RDW: 14.7 % (ref 11.5–15.5)
WBC: 4 10*3/uL (ref 4.0–10.5)

## 2016-11-03 LAB — GLUCOSE, CAPILLARY: GLUCOSE-CAPILLARY: 153 mg/dL — AB (ref 65–99)

## 2016-11-03 MED ORDER — SODIUM CHLORIDE 0.9 % IV SOLN
8.0000 mg | Freq: Once | INTRAVENOUS | Status: AC
  Administered 2016-11-03: 8 mg via INTRAVENOUS
  Filled 2016-11-03: qty 4

## 2016-11-03 MED ORDER — LIDOCAINE HCL 1 % IJ SOLN
INTRAMUSCULAR | Status: AC | PRN
  Administered 2016-11-03: 5 mL

## 2016-11-03 MED ORDER — SODIUM CHLORIDE 0.9 % IV SOLN
INTRAVENOUS | Status: DC
  Administered 2016-11-03: 08:00:00 via INTRAVENOUS

## 2016-11-03 MED ORDER — LIDOCAINE HCL 1 % IJ SOLN
INTRAMUSCULAR | Status: AC
  Filled 2016-11-03: qty 20

## 2016-11-03 MED ORDER — FENTANYL CITRATE (PF) 100 MCG/2ML IJ SOLN
INTRAMUSCULAR | Status: AC
  Filled 2016-11-03: qty 4

## 2016-11-03 MED ORDER — MIDAZOLAM HCL 2 MG/2ML IJ SOLN
INTRAMUSCULAR | Status: AC
  Filled 2016-11-03: qty 6

## 2016-11-03 MED ORDER — MIDAZOLAM HCL 2 MG/2ML IJ SOLN
INTRAMUSCULAR | Status: AC | PRN
  Administered 2016-11-03: 0.5 mg via INTRAVENOUS
  Administered 2016-11-03: 1 mg via INTRAVENOUS
  Administered 2016-11-03 (×2): 0.5 mg via INTRAVENOUS
  Administered 2016-11-03 (×2): 1 mg via INTRAVENOUS
  Administered 2016-11-03: 0.5 mg via INTRAVENOUS
  Administered 2016-11-03: 1 mg via INTRAVENOUS
  Administered 2016-11-03: 0.5 mg via INTRAVENOUS
  Administered 2016-11-03: 1 mg via INTRAVENOUS
  Administered 2016-11-03: 0.5 mg via INTRAVENOUS

## 2016-11-03 MED ORDER — PIPERACILLIN-TAZOBACTAM 3.375 G IVPB
3.3750 g | Freq: Once | INTRAVENOUS | Status: AC
  Administered 2016-11-03: 3.375 g via INTRAVENOUS
  Filled 2016-11-03: qty 50

## 2016-11-03 MED ORDER — MIDAZOLAM HCL 2 MG/2ML IJ SOLN
INTRAMUSCULAR | Status: AC
  Filled 2016-11-03: qty 2

## 2016-11-03 MED ORDER — IOPAMIDOL (ISOVUE-300) INJECTION 61%
INTRAVENOUS | Status: AC
  Filled 2016-11-03: qty 200

## 2016-11-03 MED ORDER — PANTOPRAZOLE SODIUM 40 MG IV SOLR
40.0000 mg | Freq: Once | INTRAVENOUS | Status: AC
  Administered 2016-11-03: 40 mg via INTRAVENOUS
  Filled 2016-11-03: qty 40

## 2016-11-03 MED ORDER — YTTRIUM 90 INJECTION: 17.7000 | INJECTION | Freq: Once | INTRAVENOUS | Status: DC

## 2016-11-03 MED ORDER — IOPAMIDOL (ISOVUE-300) INJECTION 61%
50.0000 mL | Freq: Once | INTRAVENOUS | Status: AC | PRN
  Administered 2016-11-03: 50 mL via INTRA_ARTERIAL

## 2016-11-03 MED ORDER — FENTANYL CITRATE (PF) 100 MCG/2ML IJ SOLN
INTRAMUSCULAR | Status: AC | PRN
  Administered 2016-11-03 (×6): 25 ug via INTRAVENOUS

## 2016-11-03 MED ORDER — DEXAMETHASONE SODIUM PHOSPHATE 10 MG/ML IJ SOLN
8.0000 mg | Freq: Once | INTRAMUSCULAR | Status: AC
  Administered 2016-11-03: 8 mg via INTRAVENOUS
  Filled 2016-11-03: qty 1

## 2016-11-03 NOTE — Sedation Documentation (Signed)
R groin level 0, 3+RDP. Drsg CDI.

## 2016-11-03 NOTE — Procedures (Signed)
Hepatic subselective arterial Y90 radioembolization No complication No blood loss. See complete dictation in [facility].

## 2016-11-03 NOTE — H&P (Signed)
Chief Complaint: [facility]   Referring Physician:Dr. Truitt Merle  Supervising Physician: Arne Cleveland  Patient Status: [facility]  HPI: Megan Horton is an 75 y.o. female who has a history of HCC of segment 8.  She has undergone DEB-TACE x 2 earlier this year; however, on recently imaging this had progressed.  She underwent roadmapping/embolization a couple of weeks ago and presents back today for her Y-90 radioembolization treatment.  She admits to some abdominal pain at home at times, but denies any other complaints.  She presents today for this procedure.  Past Medical History:  Past Medical History:  Diagnosis Date  . Adhesive capsulitis of right shoulder   . Arthritis   . Complication of anesthesia 5/13   did have some disorientation post cerv fusion due to low sats  . Diabetes mellitus ([place])   . GERD (gastroesophageal reflux disease)   . Hyperlipemia   . Hypertension   . Hypothyroidism   . Left knee DJD   . Osteopenia   . Partial tear of rotator cuff(726.13) left  . Peripheral neuropathy (HCC)    fingers   . Peripheral vascular disease ([place])   . Seasonal allergies   . Seasonal allergies   . Sleep apnea    CPAP, sleep study 3-4 years ago in [place], [place] Dr. Elbert Ewings   . Urgency of urination   . Wears glasses     Past Surgical History:  Past Surgical History:  Procedure Laterality Date  . ABDOMINAL HYSTERECTOMY     complete  . ANTERIOR CERVICAL DECOMP/DISCECTOMY FUSION  03/10/2012   Procedure: ANTERIOR CERVICAL DECOMPRESSION/DISCECTOMY FUSION 1 LEVEL;  Surgeon: Eustace Moore, MD;  Location: [place] [facility];  Service: Neurosurgery;  Laterality: N/A;  Cervical five-six anterior cervical decompression fusion with plating  . CARPAL TUNNEL RELEASE     bilat  . CERVICAL FUSION    . CHOLECYSTECTOMY  2009  . COLONOSCOPY W/ POLYPECTOMY    . COLONOSCOPY WITH PROPOFOL N/A 05/23/2015   Procedure: COLONOSCOPY WITH PROPOFOL;  Surgeon: Carol Ada, MD;  [facility];  Service: Endoscopy;  Laterality: N/A;  . ESOPHAGOGASTRODUODENOSCOPY (EGD) WITH PROPOFOL N/A 10/10/2015   Procedure: ESOPHAGOGASTRODUODENOSCOPY (EGD) WITH PROPOFOL;  Surgeon: Carol Ada, MD;  [facility];  Service: Endoscopy;  Laterality: N/A;  . EYE SURGERY  2012   bilat cataract  . HEEL SPUR EXCISION     bilat  . HEMORRHOID SURGERY  2009  . IR GENERIC HISTORICAL  05/31/2016   IR ANGIOGRAM SELECTIVE EACH ADDITIONAL VESSEL 05/31/2016 Greggory Keen, MD [facility]  . IR GENERIC HISTORICAL  05/31/2016   IR ANGIOGRAM VISCERAL SELECTIVE 05/31/2016 Greggory Keen, MD [facility]  . IR GENERIC HISTORICAL  05/31/2016   IR US GUIDE VASC ACCESS RIGHT 05/31/2016 Greggory Keen, MD [facility]  . IR GENERIC HISTORICAL  05/31/2016   IR ANGIOGRAM SELECTIVE EACH ADDITIONAL VESSEL 05/31/2016 Greggory Keen, MD [facility]  . IR GENERIC HISTORICAL  05/31/2016   IR EMBO TUMOR ORGAN ISCHEMIA INFARCT INC GUIDE ROADMAPPING 05/31/2016 Greggory Keen, MD [facility]  . IR GENERIC HISTORICAL  10/18/2016   IR ANGIOGRAM SELECTIVE EACH ADDITIONAL VESSEL 10/18/2016 Greggory Keen, MD [facility]  . IR GENERIC HISTORICAL  10/18/2016   IR ANGIOGRAM SELECTIVE EACH ADDITIONAL VESSEL 10/18/2016 Greggory Keen, MD [facility]  . IR GENERIC HISTORICAL  10/18/2016   IR US GUIDE VASC ACCESS RIGHT 10/18/2016 Greggory Keen, MD [facility]  . IR GENERIC HISTORICAL  10/18/2016   IR ANGIOGRAM VISCERAL  SELECTIVE 10/18/2016 Greggory Keen, MD [facility]  . IR GENERIC HISTORICAL  10/18/2016   IR EMBO ARTERIAL NOT HEMORR HEMANG INC GUIDE ROADMAPPING 10/18/2016 Greggory Keen, MD [facility]  . IR GENERIC HISTORICAL  10/18/2016   IR ANGIOGRAM SELECTIVE EACH ADDITIONAL VESSEL 10/18/2016 Greggory Keen, MD [facility]  . IR GENERIC HISTORICAL  10/18/2016   IR ANGIOGRAM SELECTIVE EACH ADDITIONAL VESSEL 10/18/2016 Greggory Keen, MD [facility]  . IR GENERIC HISTORICAL  10/05/2016   IR RADIOLOGIST EVAL &  MGMT 10/05/2016 Greggory Keen, MD [facility]  . KNEE ARTHROSCOPY     Left x2  . NOSE SURGERY    . SHOULDER ARTHROSCOPY  06/27/2012   Procedure: ARTHROSCOPY SHOULDER;  Surgeon: Lorn Junes, MD;  Location: [place];  Service: Orthopedics;  Laterality: Right;  right shoulder arthroscopy debridement extensive, distal clavulectomy, with resect adhesions with maniplulation  . TONSILLECTOMY    . TOTAL KNEE ARTHROPLASTY Left 07/02/2013   Procedure: TOTAL KNEE ARTHROPLASTY- left;  Surgeon: Lorn Junes, MD;  Location: [facility];  Service: Orthopedics;  Laterality: Left;  Marland Kitchen VARICOSE VEIN SURGERY     Right    Family History:  Family History  Problem Relation Age of Onset  . Heart disease Mother   . Hypertension Mother   . Stroke Mother   . Heart disease Father   . Heart attack Father   . Hypertension Father   . Heart disease Brother   . Heart attack Brother   . Heart attack Sister   . Hypertension Sister   . Stroke Brother     Social History:  reports that she quit smoking about 22 years ago. Her smoking use included Cigarettes. She has a 17.50 pack-year smoking history. She has never used smokeless tobacco. She reports that she drinks alcohol. She reports that she does not use drugs.  Allergies:  Allergies  Allergen Reactions  . Asa [Aspirin] Other (See Comments)    GERD  . Adhesive [Tape] Other (See Comments)    blister  . Tylenol [Acetaminophen] Nausea And Vomiting    Aggravates acid reflux    Medications: Medications reviewed in epic  Please HPI for pertinent positives, otherwise complete 10 system ROS negative.  Mallampati Score: MD Evaluation Airway: WNL Heart: WNL Abdomen: WNL Chest/ Lungs: WNL ASA  Classification: 3 Mallampati/Airway Score: Two  Physical Exam: BP (!) 152/74 (BP Location: Right Arm)   Pulse 61   Temp 97.7 F (36.5 C) (Oral)   Resp 16   SpO2 98%  There is no height or weight on file to calculate BMI. General:  pleasant, obese white female who is laying in bed in NAD HEENT: head is normocephalic, atraumatic.  Sclera are noninjected.  PERRL.  Ears and nose without any masses or lesions.  Mouth is pink and moist Heart: regular, rate, and rhythm.  Normal s1,s2. No obvious murmurs, gallops, or rubs noted.  Palpable radial and pedal pulses bilaterally Lungs: CTAB, no wheezes, rhonchi, or rales noted.  Respiratory effort nonlabored Abd: soft, tender in her epigastrium, ND, but obese, +BS, no masses, hernias, or organomegaly Psych: A&Ox3 with an appropriate affect.   Labs: Results for orders placed or performed during the hospital encounter of 11/03/16 (from the past 48 hour(s))  CBC with Differential/Platelet     Status: Abnormal   Collection Time: 11/03/16  7:32 AM  Result Value Ref Range   WBC 4.0 4.0 - 10.5 K/uL   RBC 3.91 3.87 - 5.11 MIL/uL  Hemoglobin 11.3 (L) 12.0 - 15.0 g/dL   HCT 34.8 (L) 36.0 - 46.0 %   MCV 89.0 78.0 - 100.0 fL   MCH 28.9 26.0 - 34.0 pg   MCHC 32.5 30.0 - 36.0 g/dL   RDW 14.7 11.5 - 15.5 %   Platelets 89 (L) 150 - 400 K/uL    Comment: SPECIMEN CHECKED FOR CLOTS PLATELET COUNT CONFIRMED BY SMEAR    Neutrophils Relative % 60 %   Neutro Abs 2.4 1.7 - 7.7 K/uL   Lymphocytes Relative 20 %   Lymphs Abs 0.8 0.7 - 4.0 K/uL   Monocytes Relative 14 %   Monocytes Absolute 0.6 0.1 - 1.0 K/uL   Eosinophils Relative 6 %   Eosinophils Absolute 0.2 0.0 - 0.7 K/uL   Basophils Relative 1 %   Basophils Absolute 0.0 0.0 - 0.1 K/uL  Comprehensive metabolic panel     Status: Abnormal   Collection Time: 11/03/16  7:32 AM  Result Value Ref Range   Sodium 140 135 - 145 mmol/L   Potassium 3.7 3.5 - 5.1 mmol/L   Chloride 105 101 - 111 mmol/L   CO2 24 22 - 32 mmol/L   Glucose, Bld 161 (H) 65 - 99 mg/dL   BUN 14 6 - 20 mg/dL   Creatinine, Ser 0.64 0.44 - 1.00 mg/dL   Calcium 8.9 8.9 - 10.3 mg/dL   Total Protein 6.9 6.5 - 8.1 g/dL   Albumin 3.4 (L) 3.5 - 5.0 g/dL   AST 39 15 - 41 U/L     ALT 41 14 - 54 U/L   Alkaline Phosphatase 138 (H) 38 - 126 U/L   Total Bilirubin 0.7 0.3 - 1.2 mg/dL   GFR calc non Af Amer >60 >60 mL/min   GFR calc Af Amer >60 >60 mL/min    Comment: (NOTE) The eGFR has been calculated using the CKD EPI equation. This calculation has not been validated in all clinical situations. eGFR's persistently <60 mL/min signify possible Chronic Kidney Disease.    Anion gap 11 5 - 15  Protime-INR     Status: None   Collection Time: 11/03/16  7:32 AM  Result Value Ref Range   Prothrombin Time 13.8 11.4 - 15.2 seconds   INR 1.06     Imaging: No results found.  Assessment/Plan 1. Hepatocellular carcinoma -we will plan today to proceed with Y-90 radioembolization of this lesion in her liver.  She has successfully already undergone the pre Y-90 roadmapping. -labs and vitals reviewed -Risks and Benefits discussed with the patient including, but not limited to bleeding, infection, vascular injury, post procedural pain, nausea, vomiting and fatigue, contrast induced renal failure, liver failure, radiation injury to the bowel, radiation induced cholecystitis, neutropenia and possible need for additional procedures. All of the patient's questions were answered, patient is agreeable to proceed. Consent signed and in chart.  Thank you for this interesting consult.  I greatly enjoyed meeting Felisa Zechman and look forward to participating in their care.  A copy of this report was sent to the requesting provider on this date.  Electronically Signed: Henreitta Cea 11/03/2016, 9:02 AM   I spent a total of    25 Minutes in face to face in clinical consultation, greater than 50% of which was counseling/coordinating care for [facility]

## 2016-11-03 NOTE — Discharge Instructions (Signed)
Post Y-90 Radioembolization Discharge Instructions  You have been given a radioactive material during your procedure.  While it is safe for you to be discharged home from the hospital, you need to proceed directly home.    Do not use public transportation, including air travel, lasting more than 2 hours for 1 week.  Avoid crowded public places for 1 week.  Adult visitors should try to avoid close contact with you for 1 week.    Children and pregnant females should not visit or have close contact with you for 1 week.  Items that you touch are not radioactive.  Do not sleep in the same bed as your partner for 1 week, and a condom should be used for sexual activity during the first 24 hours.  Your blood may be radioactive and caution should be used if any bleeding occurs during the recovery period.  Body fluids may be radioactive for 24 hours.  Wash your hands after voiding.  Men should sit to urinate.  Dispose of any soiled materials (flush down toilet or place in trash at home) during the first day.  Drink 6 to 8 glasses of fluids per day for 5 days to hydrate yourself.  If you need to see a doctor during the first week, you must let them know that you were treated with yttrium-90 microspheres, and will be slightly radioactive.  They can call Interventional Radiology [phone] with any questions.Femoral Site Care Introduction Refer to this sheet in the next few weeks. These instructions provide you with information about caring for yourself after your procedure. Your health care provider may also give you more specific instructions. Your treatment has been planned according to current medical practices, but problems sometimes occur. Call your health care provider if you have any problems or questions after your procedure. What can I expect after the procedure? After your procedure, it is typical to have the following:  Bruising at the site that usually fades within 1-2 weeks.  Blood  collecting in the tissue (hematoma) that may be painful to the touch. It should usually decrease in size and tenderness within 1-2 weeks. Follow these instructions at home:  Take medicines only as directed by your health care provider.  You may shower 24-48 hours after the procedure or as directed by your health care provider. Remove the bandage (dressing) and gently wash the site with plain soap and water. Pat the area dry with a clean towel. Do not rub the site, because this may cause bleeding.  Do not take baths, swim, or use a hot tub until your health care provider approves.  Check your insertion site every day for redness, swelling, or drainage.  Do not apply powder or lotion to the site.  Limit use of stairs to twice a day for the first 2-3 days or as directed by your health care provider.  Do not squat for the first 2-3 days or as directed by your health care provider.  Do not lift over 10 lb (4.5 kg) for 5 days after your procedure or as directed by your health care provider.  Ask your health care provider when it is okay to:  Return to work or school.  Resume usual physical activities or sports.  Resume sexual activity.  Do not drive home if you are discharged the same day as the procedure. Have someone else drive you.  You may drive 24 hours after the procedure unless otherwise instructed by your health care provider.  Do not operate  machinery or power tools for 24 hours after the procedure or as directed by your health care provider.  If your procedure was done as an outpatient procedure, which means that you went home the same day as your procedure, a responsible adult should be with you for the first 24 hours after you arrive home.  Keep all follow-up visits as directed by your health care provider. This is important. Contact a health care provider if:  You have a fever.  You have chills.  You have increased bleeding from the site. Hold pressure on the  site. Get help right away if:  You have unusual pain at the site.  You have redness, warmth, or swelling at the site.  You have drainage (other than a small amount of blood on the dressing) from the site.  The site is bleeding, and the bleeding does not stop after 30 minutes of holding steady pressure on the site.  Your leg or foot becomes pale, cool, tingly, or numb. This information is not intended to replace advice given to you by your health care provider. Make sure you discuss any questions you have with your health care provider. Document Released: 06/28/2014 Document Revised: 04/01/2016 Document Reviewed: 05/14/2014  2017 Elsevier Moderate Conscious Sedation, Adult Sedation is the use of medicines to promote relaxation and relieve discomfort and anxiety. Moderate conscious sedation is a type of sedation. Under moderate conscious sedation, you are less alert than normal, but you are still able to respond to instructions, touch, or both. Moderate conscious sedation is used during short medical and dental procedures. It is milder than deep sedation, which is a type of sedation under which you cannot be easily woken up. It is also milder than general anesthesia, which is the use of medicines to make you unconscious. Moderate conscious sedation allows you to return to your regular activities sooner. Tell a health care provider about:  Any allergies you have.  All medicines you are taking, including vitamins, herbs, eye drops, creams, and over-the-counter medicines.  Use of steroids (by mouth or creams).  Any problems you or family members have had with sedatives and anesthetic medicines.  Any blood disorders you have.  Any surgeries you have had.  Any medical conditions you have, such as sleep apnea.  Whether you are pregnant or may be pregnant.  Any use of cigarettes, alcohol, marijuana, or street drugs. What are the risks? Generally, this is a safe procedure. However,  problems may occur, including:  Getting too much medicine (oversedation).  Nausea.  Allergic reaction to medicines.  Trouble breathing. If this happens, a breathing tube may be used to help with breathing. It will be removed when you are awake and breathing on your own.  Heart trouble.  Lung trouble. What happens before the procedure? Staying hydrated  Follow instructions from your health care provider about hydration, which may include:  Up to 2 hours before the procedure - you may continue to drink clear liquids, such as water, clear fruit juice, black coffee, and plain tea. Eating and drinking restrictions  Follow instructions from your health care provider about eating and drinking, which may include:  8 hours before the procedure - stop eating heavy meals or foods such as meat, fried foods, or fatty foods.  6 hours before the procedure - stop eating light meals or foods, such as toast or cereal.  6 hours before the procedure - stop drinking milk or drinks that contain milk.  2 hours before the procedure -  stop drinking clear liquids. Medicine  Ask your health care provider about:  Changing or stopping your regular medicines. This is especially important if you are taking diabetes medicines or blood thinners.  Taking medicines such as aspirin and ibuprofen. These medicines can thin your blood. Do not take these medicines before your procedure if your health care provider instructs you not to. Tests and exams  You will have a physical exam.  You may have blood tests done to show:  How well your kidneys and liver are working.  How well your blood can clot. General instructions  Plan to have someone take you home from the hospital or clinic.  If you will be going home right after the procedure, plan to have someone with you for 24 hours. What happens during the procedure?  An IV tube will be inserted into one of your veins.  Medicine to help you relax (sedative)  will be given through the IV tube.  The medical or dental procedure will be performed. What happens after the procedure?  Your blood pressure, heart rate, breathing rate, and blood oxygen level will be monitored often until the medicines you were given have worn off.  Do not drive for 24 hours. This information is not intended to replace advice given to you by your health care provider. Make sure you discuss any questions you have with your health care provider. Document Released: 07/20/2001 Document Revised: 03/30/2016 Document Reviewed: 02/14/2016 Elsevier Interactive Patient Education  2017 [employer].

## 2016-11-03 NOTE — Sedation Documentation (Signed)
5 Fr sheath removed from R femoral artery by Dr. Vernard Gambles. Hemostasis achieved using Exoseal closure device. Groin unremarkable, 3+RDP.

## 2016-11-11 ENCOUNTER — Encounter: Payer: Self-pay | Admitting: Interventional Radiology

## 2016-11-12 ENCOUNTER — Encounter: Payer: Self-pay | Admitting: Interventional Radiology

## 2016-11-12 DIAGNOSIS — E1122 Type 2 diabetes mellitus with diabetic chronic kidney disease: Secondary | ICD-10-CM | POA: Diagnosis not present

## 2016-11-12 DIAGNOSIS — J019 Acute sinusitis, unspecified: Secondary | ICD-10-CM | POA: Diagnosis not present

## 2016-11-12 DIAGNOSIS — J209 Acute bronchitis, unspecified: Secondary | ICD-10-CM | POA: Diagnosis not present

## 2016-11-15 ENCOUNTER — Other Ambulatory Visit (HOSPITAL_COMMUNITY): Payer: Self-pay | Admitting: Interventional Radiology

## 2016-11-15 DIAGNOSIS — C220 Liver cell carcinoma: Secondary | ICD-10-CM

## 2016-11-25 ENCOUNTER — Other Ambulatory Visit: Payer: Medicare Other

## 2016-11-30 DIAGNOSIS — N644 Mastodynia: Secondary | ICD-10-CM | POA: Diagnosis not present

## 2016-11-30 DIAGNOSIS — Z803 Family history of malignant neoplasm of breast: Secondary | ICD-10-CM | POA: Diagnosis not present

## 2016-11-30 DIAGNOSIS — C220 Liver cell carcinoma: Secondary | ICD-10-CM | POA: Diagnosis not present

## 2016-12-08 ENCOUNTER — Encounter: Payer: Self-pay | Admitting: *Deleted

## 2016-12-08 ENCOUNTER — Ambulatory Visit
Admission: RE | Admit: 2016-12-08 | Discharge: 2016-12-08 | Disposition: A | Discharge status: Home / Self Care | Payer: Medicare Other | Source: Ambulatory Visit | Attending: Interventional Radiology | Admitting: Interventional Radiology

## 2016-12-08 DIAGNOSIS — C220 Liver cell carcinoma: Secondary | ICD-10-CM

## 2016-12-08 HISTORY — PX: IR GENERIC HISTORICAL: IMG1180011

## 2016-12-08 NOTE — Progress Notes (Signed)
Patient ID: Megan Horton, female   DOB: 1940-11-12, 76 y.o.   MRN: [id number]       Chief Complaint:  1 month status post left hepatic Y 90 radio embolization   Referring Physician(s): Burr Medico  History of Present Illness: Megan Horton is a 76 y.o. female with moderately differentiated right hepatocellular carcinoma. She is now one month status post Y 90 radio embolization to the left hepatic lobe. She has also had 2 previous DEB TACE. She returns for outpatient follow-up. Overall she has recovered very well. No current abdominal pain, flank pain or epigastric pain. No signs of jaundice. No current physical limitations. She is back to her baseline with a good functional status. Overall she continues to do very well.  Past Medical History:  Diagnosis Date  . Adhesive capsulitis of right shoulder   . Arthritis   . Complication of anesthesia 5/13   did have some disorientation post cerv fusion due to low sats  . Diabetes mellitus ([place])   . GERD (gastroesophageal reflux disease)   . Hyperlipemia   . Hypertension   . Hypothyroidism   . Left knee DJD   . Osteopenia   . Partial tear of rotator cuff(726.13) left  . Peripheral neuropathy ([facility])    fingers   . Peripheral vascular disease ([place])   . Seasonal allergies   . Seasonal allergies   . Sleep apnea    CPAP, sleep study 3-4 years ago in [place], [place] Dr. Elbert Ewings   . Urgency of urination   . Wears glasses     Past Surgical History:  Procedure Laterality Date  . ABDOMINAL HYSTERECTOMY     complete  . ANTERIOR CERVICAL DECOMP/DISCECTOMY FUSION  03/10/2012   Procedure: ANTERIOR CERVICAL DECOMPRESSION/DISCECTOMY FUSION 1 LEVEL;  Surgeon: Eustace Moore, MD;  Location: [facility];  Service: Neurosurgery;  Laterality: N/A;  Cervical five-six anterior cervical decompression fusion with plating  . CARPAL TUNNEL RELEASE     bilat  . CERVICAL FUSION    . CHOLECYSTECTOMY  2009  . COLONOSCOPY W/ POLYPECTOMY    . COLONOSCOPY WITH  PROPOFOL N/A 05/23/2015   Procedure: COLONOSCOPY WITH PROPOFOL;  Surgeon: Carol Ada, MD;  Location: [facility];  Service: Endoscopy;  Laterality: N/A;  . ESOPHAGOGASTRODUODENOSCOPY (EGD) WITH PROPOFOL N/A 10/10/2015   Procedure: ESOPHAGOGASTRODUODENOSCOPY (EGD) WITH PROPOFOL;  Surgeon: Carol Ada, MD;  Location: [facility];  Service: Endoscopy;  Laterality: N/A;  . EYE SURGERY  2012   bilat cataract  . HEEL SPUR EXCISION     bilat  . HEMORRHOID SURGERY  2009  . IR GENERIC HISTORICAL  05/31/2016   IR ANGIOGRAM SELECTIVE EACH ADDITIONAL VESSEL 05/31/2016 Greggory Keen, MD [facility]  . IR GENERIC HISTORICAL  05/31/2016   IR ANGIOGRAM VISCERAL SELECTIVE 05/31/2016 Greggory Keen, MD [facility]  . IR GENERIC HISTORICAL  05/31/2016   IR US GUIDE VASC ACCESS RIGHT 05/31/2016 Greggory Keen, MD [facility]  . IR GENERIC HISTORICAL  05/31/2016   IR ANGIOGRAM SELECTIVE EACH ADDITIONAL VESSEL 05/31/2016 Greggory Keen, MD [facility]  . IR GENERIC HISTORICAL  05/31/2016   IR EMBO TUMOR ORGAN ISCHEMIA INFARCT INC GUIDE ROADMAPPING 05/31/2016 Greggory Keen, MD [facility]  . IR GENERIC HISTORICAL  10/18/2016   IR ANGIOGRAM SELECTIVE EACH ADDITIONAL VESSEL 10/18/2016 Greggory Keen, MD [facility]  . IR GENERIC HISTORICAL  10/18/2016   IR ANGIOGRAM SELECTIVE EACH ADDITIONAL VESSEL 10/18/2016 Greggory Keen, MD [facility]  . IR GENERIC HISTORICAL  10/18/2016   IR US GUIDE  VASC ACCESS RIGHT 10/18/2016 Greggory Keen, MD [facility]  . IR GENERIC HISTORICAL  10/18/2016   IR ANGIOGRAM VISCERAL SELECTIVE 10/18/2016 Greggory Keen, MD [facility]  . IR GENERIC HISTORICAL  10/18/2016   IR EMBO ARTERIAL NOT HEMORR HEMANG INC GUIDE ROADMAPPING 10/18/2016 Greggory Keen, MD [facility]  . IR GENERIC HISTORICAL  10/18/2016   IR ANGIOGRAM SELECTIVE EACH ADDITIONAL VESSEL 10/18/2016 Greggory Keen, MD [facility]  . IR GENERIC HISTORICAL  10/18/2016   IR ANGIOGRAM SELECTIVE EACH ADDITIONAL VESSEL  10/18/2016 Greggory Keen, MD [facility]  . IR GENERIC HISTORICAL  10/05/2016   IR RADIOLOGIST EVAL & MGMT 10/05/2016 Greggory Keen, MD [facility]  . IR GENERIC HISTORICAL  11/03/2016   IR EMBO TUMOR ORGAN ISCHEMIA INFARCT INC GUIDE ROADMAPPING 11/03/2016 Arne Cleveland, MD [facility]  . IR GENERIC HISTORICAL  11/03/2016   IR ANGIOGRAM VISCERAL SELECTIVE 11/03/2016 Arne Cleveland, MD [facility]  . IR GENERIC HISTORICAL  11/03/2016   IR ANGIOGRAM SELECTIVE EACH ADDITIONAL VESSEL 11/03/2016 Arne Cleveland, MD [facility]  . IR GENERIC HISTORICAL  11/03/2016   IR US GUIDE VASC ACCESS RIGHT 11/03/2016 Arne Cleveland, MD [facility]  . IR GENERIC HISTORICAL  11/03/2016   IR ANGIOGRAM SELECTIVE EACH ADDITIONAL VESSEL 11/03/2016 Arne Cleveland, MD [facility]  . IR GENERIC HISTORICAL  11/03/2016   IR ANGIOGRAM SELECTIVE EACH ADDITIONAL VESSEL 11/03/2016 Arne Cleveland, MD [facility]  . IR GENERIC HISTORICAL  11/03/2016   IR ANGIOGRAM SELECTIVE EACH ADDITIONAL VESSEL 11/03/2016 Arne Cleveland, MD [facility]  . IR GENERIC HISTORICAL  07/07/2016   IR RADIOLOGIST EVAL & MGMT 07/07/2016 Greggory Keen, MD [facility]  . IR GENERIC HISTORICAL  05/18/2016   IR RADIOLOGIST EVAL & MGMT 05/18/2016 [facility]  . IR GENERIC HISTORICAL  12/08/2016   IR RADIOLOGIST EVAL & MGMT 12/08/2016 Greggory Keen, MD [facility]  . KNEE ARTHROSCOPY     Left x2  . NOSE SURGERY    . SHOULDER ARTHROSCOPY  06/27/2012   Procedure: ARTHROSCOPY SHOULDER;  Surgeon: Lorn Junes, MD;  Location: [facility];  Service: Orthopedics;  Laterality: Right;  right shoulder arthroscopy debridement extensive, distal clavulectomy, with resect adhesions with maniplulation  . TONSILLECTOMY    . TOTAL KNEE ARTHROPLASTY Left 07/02/2013   Procedure: TOTAL KNEE ARTHROPLASTY- left;  Surgeon: Lorn Junes, MD;  Location: [facility];  Service: Orthopedics;  Laterality: Left;  Marland Kitchen VARICOSE VEIN  SURGERY     Right    Allergies: Asa [aspirin]; Adhesive [tape]; and Tylenol [acetaminophen]  Medications: Prior to Admission medications   Medication Sig Start Date End Date Taking? Authorizing Provider  amLODipine (NORVASC) 5 MG tablet Take 5 mg by mouth every morning.     Historical Provider, MD  calcium-vitamin D (OSCAL WITH D) 250-125 MG-UNIT tablet Take 1 tablet by mouth daily.    Historical Provider, MD  cycloSPORINE (RESTASIS) 0.05 % ophthalmic emulsion Place 1 drop into both eyes 2 (two) times daily.    Historical Provider, MD  dexlansoprazole (DEXILANT) 60 MG capsule Take 60 mg by mouth every evening.     Historical Provider, MD  docusate sodium (COLACE) 100 MG capsule Take 1 capsule (100 mg total) by mouth daily as needed for mild constipation. 01/20/16   Saverio Danker, PA-C  DULoxetine (CYMBALTA) 60 MG capsule Take 60 mg by mouth every evening.     Historical Provider, MD  esomeprazole (NEXIUM) 40 MG capsule Take 40 mg by mouth every morning.  Historical Provider, MD  fluticasone (FLONASE) 50 MCG/ACT nasal spray Place 1 spray into both nostrils 2 (two) times daily as needed for allergies or rhinitis.     Historical Provider, MD  furosemide (LASIX) 20 MG tablet Take 20 mg by mouth daily. Reported on 02/10/2016 10/20/15   Historical Provider, MD  gabapentin (NEURONTIN) 300 MG capsule Take 300 mg by mouth at bedtime.     Historical Provider, MD  hydrochlorothiazide (MICROZIDE) 12.5 MG capsule Take 12.5 mg by mouth daily.     Historical Provider, MD  levothyroxine (SYNTHROID, LEVOTHROID) 112 MCG tablet Take 112 mcg by mouth daily. 12/22/15   Historical Provider, MD  lovastatin (MEVACOR) 40 MG tablet Take 40 mg by mouth at bedtime.    Historical Provider, MD  metFORMIN (GLUCOPHAGE-XR) 500 MG 24 hr tablet Take 500 mg by mouth daily. 05/12/16   Historical Provider, MD  Multiple Vitamins-Minerals (CENTRUM SILVER PO) Take 1 tablet by mouth every morning.     Historical Provider, MD    Olopatadine HCl (PATANASE) 0.6 % SOLN Place 1 each into the nose daily as needed (for allergies).    Historical Provider, MD  Omega-3 Fatty Acids (FISH OIL PO) Take 1 tablet by mouth daily.    Historical Provider, MD  ranitidine (ZANTAC) 150 MG tablet Take 150 mg by mouth at bedtime. Reported on 11/21/2015    Historical Provider, MD  telmisartan (MICARDIS) 80 MG tablet Take 80 mg by mouth every morning.     Historical Provider, MD  tetrahydrozoline (VISINE) 0.05 % ophthalmic solution Place 1 drop into both eyes 2 (two) times daily as needed (for allergies).     Historical Provider, MD     Family History  Problem Relation Age of Onset  . Heart disease Mother   . Hypertension Mother   . Stroke Mother   . Heart disease Father   . Heart attack Father   . Hypertension Father   . Heart disease Brother   . Heart attack Brother   . Heart attack Sister   . Hypertension Sister   . Stroke Brother     Social History   Social History  . Marital status: Widowed    Spouse name: N/A  . Number of children: N/A  . Years of education: N/A   Occupational History  . retired    Social History Main Topics  . Smoking status: Former Smoker    Packs/day: 0.50    Years: 35.00    Types: Cigarettes    Quit date: 12/26/1993  . Smokeless tobacco: Never Used  . Alcohol use Yes     Comment: very rarely wine, on holidays   . Drug use: No  . Sexual activity: Not on file   Other Topics Concern  . Not on file   Social History Narrative  . No narrative on file    ECOG Status: 1 - Symptomatic but completely ambulatory  Review of Systems: A 12 point ROS discussed and pertinent positives are indicated in the HPI above.  All other systems are negative.  Review of Systems  Vital Signs: BP (!) 155/62 (BP Location: Left Arm, Patient Position: Sitting, Cuff Size: Large)   Pulse 67   Temp 98.1 F (36.7 C) (Oral)   Resp 17   Ht 5\' 4"  (1.626 m)   Wt 235 lb (106.6 kg)   SpO2 97%   BMI 40.34 kg/m    Physical Exam  Constitutional: She is oriented to person, place, and time. She appears well-developed and  well-nourished. No distress.  Eyes: Conjunctivae are normal. No scleral icterus.  Cardiovascular: Normal rate and regular rhythm.   No murmur heard. Pulmonary/Chest: Effort normal and breath sounds normal. No respiratory distress.  Abdominal: Soft. Bowel sounds are normal. She exhibits no distension.  Musculoskeletal: Normal range of motion.  Neurological: She is alert and oriented to person, place, and time.  Skin: Skin is warm and dry. She is not diaphoretic.  Psychiatric: She has a normal mood and affect. Her behavior is normal.    Mallampati Score:   2  Imaging: Ir Radiologist Eval & Mgmt  Result Date: 12/08/2016 Please refer to "Notes" to see consult details.   Labs:  CBC:  Recent Labs  04/12/16 1407 05/31/16 1025 10/18/16 0902 11/03/16 0732  WBC 6.6 4.1 2.6* 4.0  HGB 12.9 11.8* 11.8* 11.3*  HCT 40.1 38.2 36.8 34.8*  PLT 100* 90* 82* 89*    COAGS:  Recent Labs  01/19/16 0930 05/31/16 1025 10/18/16 0902 11/03/16 0732  INR 0.94 1.05 1.05 1.06  APTT 30 31  --   --     BMP:  Recent Labs  01/20/16 0522 04/12/16 1407 05/31/16 1025 10/05/16 1029 10/18/16 0902 11/03/16 0732  NA 141 143 140  --  137 140  K 3.7 3.8 4.0  --  3.7 3.7  CL 106  --  106  --  105 105  CO2 25 28 26   --  25 24  GLUCOSE 163* 163* 211*  --  169* 161*  BUN 12 8.2 16  --  13 14  CALCIUM 8.8* 9.6 8.5*  --  9.2 8.9  CREATININE 0.57 0.8 0.80 0.70 0.70 0.64  GFRNONAA >60  --  >60  --  >60 >60  GFRAA >60  --  >60  --  >60 >60    LIVER FUNCTION TESTS:  Recent Labs  04/12/16 1407 05/31/16 1025 10/18/16 0902 11/03/16 0732  BILITOT 0.81 1.0 0.7 0.7  AST 32 54* 50* 39  ALT 42 60* 43 41  ALKPHOS 152* 142* 133* 138*  PROT 7.4 6.4* 6.9 6.9  ALBUMIN 3.7 3.6 3.6 3.4*    TUMOR MARKERS: No results for input(s): AFPTM, CEA, CA199, CHROMGRNA in the last 8760  hours.  Assessment and Plan:  1 month status post left hepatic Y 90 radio embolization and 2 previous DEB TACE. Overall she has recovered from the radio embolization very well. She is back to her baseline. No current complaints.  Plan: Repeat MRI without and with contrast in 3 months. Outpatient follow-up at that time to review imaging.    Electronically Signed: Greggory Keen 12/08/2016, 12:50 PM   I spent a total of    25 Minutes in face to face in clinical consultation, greater than 50% of which was counseling/coordinating care for this patient with well-differentiated hepatocellular carcinoma

## 2016-12-15 DIAGNOSIS — N644 Mastodynia: Secondary | ICD-10-CM | POA: Diagnosis not present

## 2016-12-15 DIAGNOSIS — R922 Inconclusive mammogram: Secondary | ICD-10-CM | POA: Diagnosis not present

## 2016-12-20 DIAGNOSIS — K7460 Unspecified cirrhosis of liver: Secondary | ICD-10-CM | POA: Diagnosis not present

## 2016-12-20 DIAGNOSIS — K219 Gastro-esophageal reflux disease without esophagitis: Secondary | ICD-10-CM | POA: Diagnosis not present

## 2016-12-20 DIAGNOSIS — C229 Malignant neoplasm of liver, not specified as primary or secondary: Secondary | ICD-10-CM | POA: Diagnosis not present

## 2017-01-05 DIAGNOSIS — M8580 Other specified disorders of bone density and structure, unspecified site: Secondary | ICD-10-CM | POA: Diagnosis not present

## 2017-01-05 DIAGNOSIS — M064 Inflammatory polyarthropathy: Secondary | ICD-10-CM | POA: Diagnosis not present

## 2017-01-05 DIAGNOSIS — M79643 Pain in unspecified hand: Secondary | ICD-10-CM | POA: Diagnosis not present

## 2017-01-05 DIAGNOSIS — M25539 Pain in unspecified wrist: Secondary | ICD-10-CM | POA: Diagnosis not present

## 2017-01-12 DIAGNOSIS — M8580 Other specified disorders of bone density and structure, unspecified site: Secondary | ICD-10-CM | POA: Diagnosis not present

## 2017-01-12 DIAGNOSIS — M25539 Pain in unspecified wrist: Secondary | ICD-10-CM | POA: Diagnosis not present

## 2017-01-12 DIAGNOSIS — M064 Inflammatory polyarthropathy: Secondary | ICD-10-CM | POA: Diagnosis not present

## 2017-01-12 DIAGNOSIS — M79643 Pain in unspecified hand: Secondary | ICD-10-CM | POA: Diagnosis not present

## 2017-01-19 DIAGNOSIS — K219 Gastro-esophageal reflux disease without esophagitis: Secondary | ICD-10-CM | POA: Diagnosis not present

## 2017-01-27 DIAGNOSIS — I129 Hypertensive chronic kidney disease with stage 1 through stage 4 chronic kidney disease, or unspecified chronic kidney disease: Secondary | ICD-10-CM | POA: Diagnosis not present

## 2017-01-27 DIAGNOSIS — G609 Hereditary and idiopathic neuropathy, unspecified: Secondary | ICD-10-CM | POA: Diagnosis not present

## 2017-01-27 DIAGNOSIS — E1122 Type 2 diabetes mellitus with diabetic chronic kidney disease: Secondary | ICD-10-CM | POA: Diagnosis not present

## 2017-01-27 DIAGNOSIS — E785 Hyperlipidemia, unspecified: Secondary | ICD-10-CM | POA: Diagnosis not present

## 2017-01-27 DIAGNOSIS — E039 Hypothyroidism, unspecified: Secondary | ICD-10-CM | POA: Diagnosis not present

## 2017-02-07 DIAGNOSIS — M79643 Pain in unspecified hand: Secondary | ICD-10-CM | POA: Diagnosis not present

## 2017-02-07 DIAGNOSIS — M064 Inflammatory polyarthropathy: Secondary | ICD-10-CM | POA: Diagnosis not present

## 2017-02-07 DIAGNOSIS — M1990 Unspecified osteoarthritis, unspecified site: Secondary | ICD-10-CM | POA: Diagnosis not present

## 2017-02-07 DIAGNOSIS — M8580 Other specified disorders of bone density and structure, unspecified site: Secondary | ICD-10-CM | POA: Diagnosis not present

## 2017-02-10 ENCOUNTER — Other Ambulatory Visit: Payer: Self-pay | Admitting: Radiology

## 2017-02-10 ENCOUNTER — Other Ambulatory Visit (HOSPITAL_COMMUNITY): Payer: Self-pay | Admitting: Interventional Radiology

## 2017-02-10 DIAGNOSIS — C220 Liver cell carcinoma: Secondary | ICD-10-CM

## 2017-02-15 DIAGNOSIS — N182 Chronic kidney disease, stage 2 (mild): Secondary | ICD-10-CM | POA: Diagnosis not present

## 2017-02-24 DIAGNOSIS — C220 Liver cell carcinoma: Secondary | ICD-10-CM | POA: Diagnosis not present

## 2017-02-24 LAB — COMPREHENSIVE METABOLIC PANEL
ALK PHOS: 141 U/L — AB (ref 33–130)
ALT: 27 U/L (ref 6–29)
AST: 38 U/L — ABNORMAL HIGH (ref 10–35)
Albumin: 3.3 g/dL — ABNORMAL LOW (ref 3.6–5.1)
BUN: 10 mg/dL (ref 7–25)
CALCIUM: 8.6 mg/dL (ref 8.6–10.4)
CO2: 26 mmol/L (ref 20–31)
Chloride: 106 mmol/L (ref 98–110)
Creat: 0.64 mg/dL (ref 0.60–0.93)
GLUCOSE: 160 mg/dL — AB (ref 65–99)
Potassium: 3.9 mmol/L (ref 3.5–5.3)
Sodium: 140 mmol/L (ref 135–146)
TOTAL PROTEIN: 6.4 g/dL (ref 6.1–8.1)
Total Bilirubin: 0.9 mg/dL (ref 0.2–1.2)

## 2017-02-24 LAB — CBC
HCT: 34.6 % — ABNORMAL LOW (ref 35.0–45.0)
HEMOGLOBIN: 11.2 g/dL — AB (ref 11.7–15.5)
MCH: 28.6 pg (ref 27.0–33.0)
MCHC: 32.4 g/dL (ref 32.0–36.0)
MCV: 88.3 fL (ref 80.0–100.0)
MPV: 11.4 fL (ref 7.5–12.5)
Platelets: 106 10*3/uL — ABNORMAL LOW (ref 140–400)
RBC: 3.92 MIL/uL (ref 3.80–5.10)
RDW: 15 % (ref 11.0–15.0)
WBC: 3.1 10*3/uL — AB (ref 3.8–10.8)

## 2017-02-24 LAB — PROTIME-INR
INR: 1.1
Prothrombin Time: 11.8 s — ABNORMAL HIGH (ref 9.0–11.5)

## 2017-02-25 LAB — AFP TUMOR MARKER: AFP-Tumor Marker: 2301.8 ng/mL — ABNORMAL HIGH (ref <6.1)

## 2017-02-28 ENCOUNTER — Telehealth: Payer: Self-pay | Admitting: Hematology

## 2017-02-28 NOTE — Telephone Encounter (Signed)
Scheduled pt to see Dr. Burr Medico on 5/7 at 815am. Referral was sent from [facility]. She is an est pt of Dr. Burr Medico. Pt agreed to the appt date and time.

## 2017-03-01 ENCOUNTER — Telehealth: Payer: Self-pay | Admitting: Hematology

## 2017-03-01 ENCOUNTER — Telehealth: Payer: Self-pay | Admitting: *Deleted

## 2017-03-01 ENCOUNTER — Ambulatory Visit
Admission: RE | Admit: 2017-03-01 | Discharge: 2017-03-01 | Disposition: A | Discharge status: Home / Self Care | Payer: Medicare Other | Source: Ambulatory Visit | Attending: Interventional Radiology | Admitting: Interventional Radiology

## 2017-03-01 ENCOUNTER — Ambulatory Visit (HOSPITAL_COMMUNITY)
Admission: RE | Admit: 2017-03-01 | Discharge: 2017-03-01 | Disposition: A | Discharge status: Home / Self Care | Payer: Medicare Other | Source: Ambulatory Visit | Attending: Interventional Radiology | Admitting: Interventional Radiology

## 2017-03-01 DIAGNOSIS — I517 Cardiomegaly: Secondary | ICD-10-CM | POA: Insufficient documentation

## 2017-03-01 DIAGNOSIS — E785 Hyperlipidemia, unspecified: Secondary | ICD-10-CM | POA: Diagnosis not present

## 2017-03-01 DIAGNOSIS — E1142 Type 2 diabetes mellitus with diabetic polyneuropathy: Secondary | ICD-10-CM | POA: Diagnosis not present

## 2017-03-01 DIAGNOSIS — R188 Other ascites: Secondary | ICD-10-CM | POA: Diagnosis not present

## 2017-03-01 DIAGNOSIS — R590 Localized enlarged lymph nodes: Secondary | ICD-10-CM | POA: Diagnosis not present

## 2017-03-01 DIAGNOSIS — R161 Splenomegaly, not elsewhere classified: Secondary | ICD-10-CM | POA: Insufficient documentation

## 2017-03-01 DIAGNOSIS — C220 Liver cell carcinoma: Secondary | ICD-10-CM | POA: Insufficient documentation

## 2017-03-01 DIAGNOSIS — E039 Hypothyroidism, unspecified: Secondary | ICD-10-CM | POA: Diagnosis not present

## 2017-03-01 DIAGNOSIS — I129 Hypertensive chronic kidney disease with stage 1 through stage 4 chronic kidney disease, or unspecified chronic kidney disease: Secondary | ICD-10-CM | POA: Diagnosis not present

## 2017-03-01 HISTORY — PX: IR RADIOLOGIST EVAL & MGMT: IMG5224

## 2017-03-01 MED ORDER — GADOXETATE DISODIUM 0.25 MMOL/ML IV SOLN
10.0000 mL | Freq: Once | INTRAVENOUS | Status: AC | PRN
  Administered 2017-03-01: 10 mL via INTRAVENOUS

## 2017-03-01 NOTE — Telephone Encounter (Signed)
Spoke with patient re f/u tomorrow 4/25 at 1:00 pm.

## 2017-03-01 NOTE — Progress Notes (Signed)
[place]  Telephone:([phone] Fax:(336) [phone]  Clinic follow Up Note   Patient Care Team: Thressa Sheller, MD as PCP - General (Internal Medicine) Carol Ada, MD as Consulting Physician (Gastroenterology) Stark Klein, MD as Consulting Physician (General Surgery) 03/02/2017    CHIEF COMPLAINTS:  Follow up [facility]    Oncology History   Hepatocellular carcinoma [facility])   Staging form: Liver (Excluding Intrahepatic Bile Ducts), AJCC 7th Edition     Clinical stage from 12/01/2015: Stage I (T1, N0, M0) - Signed by Truitt Merle, MD on 04/12/2016       Hepatocellular carcinoma ([place])   10/27/2015 Imaging    Liver MRI with and without contrast showed a 4 cm lesion in the segment 8 liver, image characteristics  favor HCC. Mild hepatomegaly, no other metastasis.      11/21/2015 Initial Diagnosis    Hepatocellular carcinoma ([place])      12/01/2015 Initial Biopsy    Liver core needle biopsy showed hepatocellular carcinoma, moderately differentiated.      01/19/2016 Procedure    TACE to the right lobe liver cancer by Dr. Annamaria Boots       02/13/2016 Imaging    Abdominal MRI showed slightly smaller segment 8 liver lesion with reduced amount of central enhancement. Morphological findings of cirrhosis with background hepatic steatosis. Borderline prominent portal hepatis lymph node 1.3 cm, stable.      05/18/2016 Imaging    MRI abdomen with and without contrast 05/18/2016 IMPRESSION: No significant change in size of right hepatic lobe mass in segment 8. No new or progressive hepatic neoplasm identified. Stable mild porta hepatis lymphadenopathy measuring up to 1.3 cm. No new or progressive lymphadenopathy identified. Stable hepatic cirrhosis and findings of portal venous hypertension, including mild splenomegaly and upper abdominal portosystemic venous collaterals.      05/31/2016 Procedure    Repeat visceral angiograms with peripheral right hepatic DEB- TACE to dominant  right hepatic vasculature supplying the known hepatocellular carcinoma segment 8 on 05/31/16 by Dr. Annamaria Boots      10/05/2016 Imaging    MRI abdomen with and without contrast 10/05/2016 IMPRESSION: 1. Mild interval increase in size of central RIGHT hepatic lobe mass. 2. Interval extension of the tumor mass into the middle hepatic vein consistent with tumor progression. 3. Splenomegaly.  Portal vein is patent.  No ascites.      11/03/2016 Procedure    Y-90 radioembolization of the left hepatic lobe by Dr. Vernard Gambles on 11/03/16.      03/01/2017 Imaging    MRI abdomen with and without contrast 03/01/2017 IMPRESSION: 1. Significant enlargement of the central hepatic mass, with thrombus and tumor thrombus extending in the intrahepatic portal veins especially centrally, and with continued involvement of the middle hepatic vein. Tumor volume is estimated to have increased approximately 3.5 times since the prior scan. 2. Stable mild porta hepatis adenopathy. 3. Splenomegaly is mildly worsened. 4. Trace ascites especially along the liver margin. 5. Mild cardiomegaly.       HISTORY OF PRESENTING ILLNESS (11/21/2015):  Megan Horton 76 y.o. female is here because of recent abnormal scan finding of a liver lesion.   She started having intermittent epigastric pain,  nausea, vomiting and weight loss 1.5 months ago, and presented to the ER on 10/10/2015 at [facility] for worsening symptoms. A CT scan was performed and she was noted to have 4.5 cm intralobar hypodense lesion in right love of liver and splenomegaly at 14.5 cm. There was also noted dilation of her portal vein consistent  with portal HTN. She was sent to [facility] for further evaluation.She underwent endoscopy by Dr. Benson Norway on 10/10/2015, which was unremarkable. Abdominal MRI was subsequently obtained on 10/27/2015, which showed a 4.0 x 4.1 cm lesion in segment 8 of right liver lobe, with imaging features hepatocellular carcinoma. Mild  hepatomegaly and liver cirrhosis with portal hypertension with also noticed.  She still has epigastric pain after meal, good appetite, gained some weight back, no nausea, mild constipation. She denies hematochezia, change of bowel habits, or other symptoms. She lives alone and tolerates daily activities without much difficulty.  She denies prior history of liver disease, no significant alcohol drinking history, no blood transfusion. She had a screening colonoscopy on 05/23/2015 which showed 5 small polyps which were removed.  CURRENT THERAPY: pending sorafenib   INTERIM HISTORY: Megan Horton returns for follow up. She feels well and denies abdominal pain. The patient was taken off metformin and was put on insulin. Her dosage is still being worked out. She checks her BG BID with it around 160 in the morning and higher in the evenings. She takes lasix once daily for fluid retention in her lower extremities.  MEDICAL HISTORY:  Past Medical History:  Diagnosis Date  . Adhesive capsulitis of right shoulder   . Arthritis   . Complication of anesthesia 5/13   did have some disorientation post cerv fusion due to low sats  . Diabetes mellitus ([place])   . GERD (gastroesophageal reflux disease)   . Hyperlipemia   . Hypertension   . Hypothyroidism   . Left knee DJD   . Osteopenia   . Partial tear of rotator cuff(726.13) left  . Peripheral neuropathy ([facility])    fingers   . Peripheral vascular disease ([place])   . Seasonal allergies   . Seasonal allergies   . Sleep apnea    CPAP, sleep study 3-4 years ago in [place], [place] Dr. Elbert Ewings   . Urgency of urination   . Wears glasses     SURGICAL HISTORY: Past Surgical History:  Procedure Laterality Date  . ABDOMINAL HYSTERECTOMY     complete  . ANTERIOR CERVICAL DECOMP/DISCECTOMY FUSION  03/10/2012   Procedure: ANTERIOR CERVICAL DECOMPRESSION/DISCECTOMY FUSION 1 LEVEL;  Surgeon: Eustace Moore, MD;  Location: [place] [facility];  Service: Neurosurgery;   Laterality: N/A;  Cervical five-six anterior cervical decompression fusion with plating  . CARPAL TUNNEL RELEASE     bilat  . CERVICAL FUSION    . CHOLECYSTECTOMY  2009  . COLONOSCOPY W/ POLYPECTOMY    . COLONOSCOPY WITH PROPOFOL N/A 05/23/2015   Procedure: COLONOSCOPY WITH PROPOFOL;  Surgeon: Carol Ada, MD;  Location: [facility];  Service: Endoscopy;  Laterality: N/A;  . ESOPHAGOGASTRODUODENOSCOPY (EGD) WITH PROPOFOL N/A 10/10/2015   Procedure: ESOPHAGOGASTRODUODENOSCOPY (EGD) WITH PROPOFOL;  Surgeon: Carol Ada, MD;  Location: [facility];  Service: Endoscopy;  Laterality: N/A;  . EYE SURGERY  2012   bilat cataract  . HEEL SPUR EXCISION     bilat  . HEMORRHOID SURGERY  2009  . IR GENERIC HISTORICAL  05/31/2016   IR ANGIOGRAM SELECTIVE EACH ADDITIONAL VESSEL 05/31/2016 Greggory Keen, MD [facility]  . IR GENERIC HISTORICAL  05/31/2016   IR ANGIOGRAM VISCERAL SELECTIVE 05/31/2016 Greggory Keen, MD [facility]  . IR GENERIC HISTORICAL  05/31/2016   IR US GUIDE VASC ACCESS RIGHT 05/31/2016 Greggory Keen, MD [facility]  . IR GENERIC HISTORICAL  05/31/2016   IR ANGIOGRAM SELECTIVE EACH ADDITIONAL VESSEL 05/31/2016 Greggory Keen, MD [facility]  .  IR GENERIC HISTORICAL  05/31/2016   IR EMBO TUMOR ORGAN ISCHEMIA INFARCT INC GUIDE ROADMAPPING 05/31/2016 Greggory Keen, MD [facility]  . IR GENERIC HISTORICAL  10/18/2016   IR ANGIOGRAM SELECTIVE EACH ADDITIONAL VESSEL 10/18/2016 Greggory Keen, MD [facility]  . IR GENERIC HISTORICAL  10/18/2016   IR ANGIOGRAM SELECTIVE EACH ADDITIONAL VESSEL 10/18/2016 Greggory Keen, MD [facility]  . IR GENERIC HISTORICAL  10/18/2016   IR US GUIDE VASC ACCESS RIGHT 10/18/2016 Greggory Keen, MD [facility]  . IR GENERIC HISTORICAL  10/18/2016   IR ANGIOGRAM VISCERAL SELECTIVE 10/18/2016 Greggory Keen, MD [facility]  . IR GENERIC HISTORICAL  10/18/2016   IR EMBO ARTERIAL NOT HEMORR HEMANG INC GUIDE ROADMAPPING 10/18/2016 Greggory Keen, MD  [facility]  . IR GENERIC HISTORICAL  10/18/2016   IR ANGIOGRAM SELECTIVE EACH ADDITIONAL VESSEL 10/18/2016 Greggory Keen, MD [facility]  . IR GENERIC HISTORICAL  10/18/2016   IR ANGIOGRAM SELECTIVE EACH ADDITIONAL VESSEL 10/18/2016 Greggory Keen, MD [facility]  . IR GENERIC HISTORICAL  10/05/2016   IR RADIOLOGIST EVAL & MGMT 10/05/2016 Greggory Keen, MD [facility]  . IR GENERIC HISTORICAL  11/03/2016   IR EMBO TUMOR ORGAN ISCHEMIA INFARCT INC GUIDE ROADMAPPING 11/03/2016 Arne Cleveland, MD [facility]  . IR GENERIC HISTORICAL  11/03/2016   IR ANGIOGRAM VISCERAL SELECTIVE 11/03/2016 Arne Cleveland, MD [facility]  . IR GENERIC HISTORICAL  11/03/2016   IR ANGIOGRAM SELECTIVE EACH ADDITIONAL VESSEL 11/03/2016 Arne Cleveland, MD [facility]  . IR GENERIC HISTORICAL  11/03/2016   IR US GUIDE VASC ACCESS RIGHT 11/03/2016 Arne Cleveland, MD [facility]  . IR GENERIC HISTORICAL  11/03/2016   IR ANGIOGRAM SELECTIVE EACH ADDITIONAL VESSEL 11/03/2016 Arne Cleveland, MD [facility]  . IR GENERIC HISTORICAL  11/03/2016   IR ANGIOGRAM SELECTIVE EACH ADDITIONAL VESSEL 11/03/2016 Arne Cleveland, MD [facility]  . IR GENERIC HISTORICAL  11/03/2016   IR ANGIOGRAM SELECTIVE EACH ADDITIONAL VESSEL 11/03/2016 Arne Cleveland, MD [facility]  . IR GENERIC HISTORICAL  07/07/2016   IR RADIOLOGIST EVAL & MGMT 07/07/2016 Greggory Keen, MD [facility]  . IR GENERIC HISTORICAL  05/18/2016   IR RADIOLOGIST EVAL & MGMT 05/18/2016 [facility]  . IR GENERIC HISTORICAL  12/08/2016   IR RADIOLOGIST EVAL & MGMT 12/08/2016 Greggory Keen, MD [facility]  . KNEE ARTHROSCOPY     Left x2  . NOSE SURGERY    . SHOULDER ARTHROSCOPY  06/27/2012   Procedure: ARTHROSCOPY SHOULDER;  Surgeon: Lorn Junes, MD;  Location: [place];  Service: Orthopedics;  Laterality: Right;  right shoulder arthroscopy debridement extensive, distal clavulectomy, with resect adhesions with  maniplulation  . TONSILLECTOMY    . TOTAL KNEE ARTHROPLASTY Left 07/02/2013   Procedure: TOTAL KNEE ARTHROPLASTY- left;  Surgeon: Lorn Junes, MD;  Location: [place];  Service: Orthopedics;  Laterality: Left;  Marland Kitchen VARICOSE VEIN SURGERY     Right    SOCIAL HISTORY: Social History   Social History  . Marital status: Widowed    Spouse name: N/A  . Number of children: N/A  . Years of education: N/A   Occupational History  . retired    Social History Main Topics  . Smoking status: Former Smoker    Packs/day: 0.50    Years: 35.00    Types: Cigarettes    Quit date: 12/26/1993  . Smokeless tobacco: Never Used  . Alcohol use Yes     Comment: very rarely wine, on holidays   .  Drug use: No  . Sexual activity: Not on file   Other Topics Concern  . Not on file   Social History Narrative  . No narrative on file    FAMILY HISTORY: Family History  Problem Relation Age of Onset  . Heart disease Mother   . Hypertension Mother   . Stroke Mother   . Heart disease Father   . Heart attack Father   . Hypertension Father   . Heart disease Brother   . Heart attack Brother   . Heart attack Sister   . Hypertension Sister   . Stroke Brother     ALLERGIES:  is allergic to asa [aspirin]; adhesive [tape]; and tylenol [acetaminophen].  MEDICATIONS:  Current Outpatient Prescriptions  Medication Sig Dispense Refill  . alendronate (FOSAMAX) 70 MG tablet Take 70 mg by mouth once a week.     Marland Kitchen amLODipine (NORVASC) 5 MG tablet Take 5 mg by mouth every morning.     . B-D UF III MINI PEN NEEDLES 31G X 5 MM MISC     . BD PEN NEEDLE NANO U/F 32G X 4 MM MISC     . calcium-vitamin D (OSCAL WITH D) 250-125 MG-UNIT tablet Take 1 tablet by mouth daily.    Marland Kitchen COLCRYS 0.6 MG tablet Take 0.6 mg by mouth daily.     . cycloSPORINE (RESTASIS) 0.05 % ophthalmic emulsion Place 1 drop into both eyes 2 (two) times daily.    Marland Kitchen dexlansoprazole (DEXILANT) 60 MG capsule Take 60 mg by mouth every evening.     .  docusate sodium (COLACE) 100 MG capsule Take 1 capsule (100 mg total) by mouth daily as needed for mild constipation. 10 capsule 0  . DULoxetine (CYMBALTA) 60 MG capsule Take 60 mg by mouth every evening.     Marland Kitchen esomeprazole (NEXIUM) 40 MG capsule Take 40 mg by mouth every morning.     . fluticasone (FLONASE) 50 MCG/ACT nasal spray Place 1 spray into both nostrils 2 (two) times daily as needed for allergies or rhinitis.     Marland Kitchen FREESTYLE LITE test strip     . furosemide (LASIX) 20 MG tablet Take 20 mg by mouth daily. Reported on 02/10/2016    . gabapentin (NEURONTIN) 300 MG capsule Take 300 mg by mouth at bedtime.     . hydrochlorothiazide (MICROZIDE) 12.5 MG capsule Take 12.5 mg by mouth daily.     . insulin aspart protamine- aspart (NOVOLOG MIX 70/30) (70-30) 100 UNIT/ML injection Inject 8 Units into the skin daily with breakfast.    . insulin aspart protamine- aspart (NOVOLOG MIX 70/30) (70-30) 100 UNIT/ML injection Inject 10 Units into the skin daily with supper.    . levothyroxine (SYNTHROID, LEVOTHROID) 112 MCG tablet Take 112 mcg by mouth daily.    Marland Kitchen lovastatin (MEVACOR) 40 MG tablet Take 40 mg by mouth at bedtime.    . Multiple Vitamins-Minerals (CENTRUM SILVER PO) Take 1 tablet by mouth every morning.     . Olopatadine HCl (PATANASE) 0.6 % SOLN Place 1 each into the nose daily as needed (for allergies).    . Omega-3 Fatty Acids (FISH OIL PO) Take 1 tablet by mouth daily.    . ranitidine (ZANTAC) 150 MG tablet Take 150 mg by mouth at bedtime. Reported on 11/21/2015    . telmisartan (MICARDIS) 80 MG tablet Take 80 mg by mouth every morning.     Marland Kitchen tetrahydrozoline (VISINE) 0.05 % ophthalmic solution Place 1 drop into both eyes 2 (two)  times daily as needed (for allergies).      No current facility-administered medications for this visit.     REVIEW OF SYSTEMS:   Constitutional: Denies fevers, chills or abnormal night sweats Eyes: Denies blurriness of vision, double vision or watery  eyes Ears, nose, mouth, throat, and face: Denies mucositis or sore throat Respiratory: Denies cough, dyspnea or wheezes Cardiovascular: Denies palpitation, chest discomfort (+) lower extremity swelling Gastrointestinal:  Denies nausea, heartburn or change in bowel habits Skin: Denies abnormal skin rashes Lymphatics: Denies new lymphadenopathy or easy bruising Neurological:Denies numbness, tingling or new weaknesses Behavioral/Psych: Mood is stable, no new changes  All other systems were reviewed with the patient and are negative.  PHYSICAL EXAMINATION: ECOG PERFORMANCE STATUS: 0  Vitals:   03/02/17 1312  BP: (!) 138/59  Pulse: 80  Resp: 18  Temp: 98.6 F (37 C)   Filed Weights   03/02/17 1312  Weight: 228 lb 14.4 oz (103.8 kg)    GENERAL:alert, no distress and comfortable SKIN: skin color, texture, turgor are normal, no rashes or significant lesions EYES: normal, conjunctiva are pink and non-injected, sclera clear OROPHARYNX:no exudate, no erythema and lips, buccal mucosa, and tongue normal  NECK: supple, thyroid normal size, non-tender, without nodularity LYMPH:  no palpable lymphadenopathy in the cervical, axillary or inguinal LUNGS: clear to auscultation and percussion with normal breathing effort HEART: regular rate & rhythm and no murmurs and no lower extremity edema ABDOMEN:abdomen soft, non-tender and normal bowel sounds Musculoskeletal:no cyanosis of digits and no clubbing  PSYCH: alert & oriented x 3 with fluent speech NEURO: no focal motor/sensory deficits  LABORATORY DATA:  I have reviewed the data as listed CBC Latest Ref Rng & Units 02/24/2017 11/03/2016 10/18/2016  WBC 3.8 - 10.8 K/uL 3.1(L) 4.0 2.6(L)  Hemoglobin 11.7 - 15.5 g/dL 11.2(L) 11.3(L) 11.8(L)  Hematocrit 35.0 - 45.0 % 34.6(L) 34.8(L) 36.8  Platelets 140 - 400 K/uL 106(L) 89(L) 82(L)    Recent Labs  05/31/16 1025  10/18/16 0902 11/03/16 0732 02/24/17 1106  NA 140  --  137 140 140  K 4.0   --  3.7 3.7 3.9  CL 106  --  105 105 106  CO2 26  --  25 24 26   GLUCOSE 211*  --  169* 161* 160*  BUN 16  --  13 14 10   CREATININE 0.80  < > 0.70 0.64 0.64  CALCIUM 8.5*  --  9.2 8.9 8.6  GFRNONAA >60  --  >60 >60  --   GFRAA >60  --  >60 >60  --   PROT 6.4*  --  6.9 6.9 6.4  ALBUMIN 3.6  --  3.6 3.4* 3.3*  AST 54*  --  50* 39 38*  ALT 60*  --  43 41 27  ALKPHOS 142*  --  133* 138* 141*  BILITOT 1.0  --  0.7 0.7 0.9  < > = values in this interval not displayed.   AFP 11/21/15: 124.5 04/12/16: 100.5 02/24/17: 2302.8  PATHOLOGY REPORT  Diagnosis 12/01/2015 Liver, needle/core biopsy, central right mass HEPATOCELLULAR CARCINOMA, MODERATELY DIFFERENTIATED. Microscopic Comment The biopsy shows diffuse malignant lesions which composed of well and moderately differentiated neoplastic hepatocytes. The carcinoma stains focal positive for hepar-1, negative for ck7. Reticulin and cd 34 highlights the proliferation pattern. The morphology and immunostaining pattern support the above diagnosis. This case also reviewed by Dr. Saralyn Pilar and agree.   RADIOGRAPHIC STUDIES: I have personally reviewed the radiological images as listed and agreed with  the findings in the report.  MRI abdomen with and without contrast 03/01/2017 IMPRESSION: 1. Significant enlargement of the central hepatic mass, with thrombus and tumor thrombus extending in the intrahepatic portal veins especially centrally, and with continued involvement of the middle hepatic vein. Tumor volume is estimated to have increased approximately 3.5 times since the prior scan. 2. Stable mild porta hepatis adenopathy. 3. Splenomegaly is mildly worsened. 4. Trace ascites especially along the liver margin. 5. Mild cardiomegaly.  MRI abdomen with and without contrast 10/05/2016 IMPRESSION: 1. Mild interval increase in size of central RIGHT hepatic lobe mass. 2. Interval extension of the tumor mass into the middle hepatic  vein consistent with tumor progression. 3. Splenomegaly.  Portal vein is patent.  No ascites.    ASSESSMENT & PLAN: 77 y.o. female with PMH of HTN, DM, arthritis, hypothyroidism, obesity, buthistory of liver disease or heavy drinking history, presented with epigastric pain, nausea, and weight loss. Initial abdominal CT and MRI showed a 4.1 cm mass in the right lobe of liver  1. Hepatocellular carcinoma of rght lobe, stage I  -I previously reviewed the CT and MRI findings with pt in details. The liver lesion was very concerning for malignancy, especially hepatocellular carcinoma. Although she does not have history of liver disease, image also showed portal hypertension and a mild liver cirrhosis, which is her high risk factor for [place].  -I previously reviewed her liver mass biopsy result, which confirmed [place].  -CT chest on 11/25/15 was negative for metastasis. This was previously discussed with patient -She is status post TACE by interventional radiology Dr. Annamaria Boots on 01/19/16, posttreatment MRI on 02/13/16 showed good partial response.  -MRI of the abd on 05/18/16 was stable. -The patient had repeat visceral angiograms with peripheral right hepatic DEB- TACE todominant right hepatic vasculature supplying the known hepatocellular carcinoma segment 8 on 05/31/16 by Dr. Annamaria Boots. -MRI of the abd on 10/05/16 showed mild interval increase of size of the central right hepatic lobe mass and interval extension of the tumor mass into the middle hepatic vein consistent with tumor progression. -The patient underwent Y-90 radioembolization to the LEFT hepatic lobe by Dr. Vernard Gambles on 11/03/16. -MRI of the abd on 03/01/17 showed significant enlargement of the central hepatic mass with thrombus and tumor thrombus extending in the intrahepatic portal veins. Tumor volume is estimated to have increased approximately 3.5 times since the prior scan. Stable mild porta hepatis adenopathy. Splenomegaly is mildly worsened. Dr. Annamaria Boots  does not think she is a candidate for more liver targeted therapy  -The patient's AFP has significantly increased from 100.5 in June 2017 to 2302.8 in April 2018. -We discussed options of systemic therapy, including Opdivo or sorafenib as first line systemic therapy for her [place]. The potential benefits, side effects, and the logistics were discussed with patient in details. She prefers to take a pill than iv, therefore she will start sorafenib once it ships to her.  --Chemotherapy consent: Side effects including but does not not limited to, fatigue, nausea, vomiting, diarrhea, neuropathy, fluid retention, renal and kidney dysfunction, skin rashes, etc were discussed with patient in great detail. She agrees to proceed. -I will order a restaging CT w/o contrast in 1 week to rule out thoracic metastatic disease.  2. Liver steatosis and early liver cirrhosis  -Her liver MRI on 02/13/16 showed mild hepatomegaly, steatosis and probable early cirrhosis -Her liver function is normal, no history of liver disease. -She will continue follow-up with Dr. Benson Norway  3. HTN, DM, hypothyroidism -She'll  continue follow-up with her primary care physician -The patient is now off Metformin and is taking insulin.  PLAN -Lab and follow up in 4-5 weeks. -CT chest w/o contrast in 1 week. -The patient will start Sorafenib once it arrives to her home. She will start at 200mg  bid, and increase by 200mg /day every 2 weeks (full dose 400mg  bid) if she tolerates well.    All questions were answered. The patient knows to call the clinic with any problems, questions or concerns.  I spent 35 minutes counseling the patient face to face. The total time spent in the appointment was 40 minutes and more than 50% was on counseling.     Truitt Merle, MD 03/02/2017   This document serves as a record of services personally performed by Truitt Merle, MD. It was created on her behalf by Darcus Austin, a trained medical scribe. The creation of this  record is based on the scribe's personal observations and the provider's statements to them. This document has been checked and approved by the attending provider.

## 2017-03-01 NOTE — Telephone Encounter (Signed)
Received call from [facility] reporting that Dr Annamaria Boots thinks pt should be seen this week due to progressing disease per MRI.  Informed that Dr Burr Medico is off today but will send message to schedulers to schedule pt.  Message routed to Dr Burr Medico.

## 2017-03-01 NOTE — Progress Notes (Addendum)
Patient ID: Megan Horton, female   DOB: [birth year], 76 y.o.   MRN: [id number]       Chief Complaint:  Hepatocellular carcinoma, status post embolization   Referring Physician(s): Burr Medico  History of Present Illness: Megan Horton is a 76 y.o. 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 Patient is now 4 months status post left hepatic Y 90 radio embolization. She also had 2 previous DEB TACE procedures to the right hepatic lobe. All 3 embotherapies were performed over the past year. She returns for outpatient follow-up and to review her imaging. Over the last 3 months, she has recovered from the Y 90 embolization. She reports only mild increase in fatigue. No current abdominal pain, flank or epigastric pain. No signs of jaundice. No current physical limitations. Overall she remains at her baseline with a good functional status.  Past Medical History:  Diagnosis Date  . Adhesive capsulitis of right shoulder   . Arthritis   . Complication of anesthesia 5/13   did have some disorientation post cerv fusion due to low sats  . Diabetes mellitus ([place])   . GERD (gastroesophageal reflux disease)   . Hyperlipemia   . Hypertension   . Hypothyroidism   . Left knee DJD   . Osteopenia   . Partial tear of rotator cuff(726.13) left  . Peripheral neuropathy ([facility])    fingers   . Peripheral vascular disease ([place])   . Seasonal allergies   . Seasonal allergies   . Sleep apnea    CPAP, sleep study 3-4 years ago in [place], [place] Dr. Elbert Ewings   . Urgency of urination   . Wears glasses     Past Surgical History:  Procedure Laterality Date  . ABDOMINAL HYSTERECTOMY     complete  . ANTERIOR CERVICAL DECOMP/DISCECTOMY FUSION  03/10/2012   Procedure: ANTERIOR CERVICAL DECOMPRESSION/DISCECTOMY FUSION 1 LEVEL;  Surgeon: Eustace Moore, MD;  Location: [facility];  Service: Neurosurgery;  Laterality: N/A;  Cervical five-six anterior cervical decompression fusion with plating   . CARPAL TUNNEL RELEASE     bilat  . CERVICAL FUSION    . CHOLECYSTECTOMY  2009  . COLONOSCOPY W/ POLYPECTOMY    . COLONOSCOPY WITH PROPOFOL N/A 05/23/2015   Procedure: COLONOSCOPY WITH PROPOFOL;  Surgeon: Carol Ada, MD;  Location: [facility];  Service: Endoscopy;  Laterality: N/A;  . ESOPHAGOGASTRODUODENOSCOPY (EGD) WITH PROPOFOL N/A 10/10/2015   Procedure: ESOPHAGOGASTRODUODENOSCOPY (EGD) WITH PROPOFOL;  Surgeon: Carol Ada, MD;  Location: [facility];  Service: Endoscopy;  Laterality: N/A;  . EYE SURGERY  2012   bilat cataract  . HEEL SPUR EXCISION     bilat  . HEMORRHOID SURGERY  2009  . IR GENERIC HISTORICAL  05/31/2016   IR ANGIOGRAM SELECTIVE EACH ADDITIONAL VESSEL 05/31/2016 Greggory Keen, MD [facility]  . IR GENERIC HISTORICAL  05/31/2016   IR ANGIOGRAM VISCERAL SELECTIVE 05/31/2016 Greggory Keen, MD [facility]  . IR GENERIC HISTORICAL  05/31/2016   IR US GUIDE VASC ACCESS RIGHT 05/31/2016 Greggory Keen, MD [facility]  . IR GENERIC HISTORICAL  05/31/2016   IR ANGIOGRAM SELECTIVE EACH ADDITIONAL VESSEL 05/31/2016 Greggory Keen, MD [facility]  . IR GENERIC HISTORICAL  05/31/2016   IR EMBO TUMOR ORGAN ISCHEMIA INFARCT INC GUIDE ROADMAPPING 05/31/2016 Greggory Keen, MD [facility]  . IR GENERIC HISTORICAL  10/18/2016   IR ANGIOGRAM SELECTIVE EACH ADDITIONAL VESSEL 10/18/2016 Greggory Keen, MD [facility]  . IR GENERIC HISTORICAL  10/18/2016   IR ANGIOGRAM SELECTIVE EACH ADDITIONAL VESSEL  10/18/2016 Greggory Keen, MD [facility]  . IR GENERIC HISTORICAL  10/18/2016   IR US GUIDE VASC ACCESS RIGHT 10/18/2016 Greggory Keen, MD [facility]  . IR GENERIC HISTORICAL  10/18/2016   IR ANGIOGRAM VISCERAL SELECTIVE 10/18/2016 Greggory Keen, MD [facility]  . IR GENERIC HISTORICAL  10/18/2016   IR EMBO ARTERIAL NOT HEMORR HEMANG INC GUIDE ROADMAPPING 10/18/2016 Greggory Keen, MD [facility]  . IR GENERIC HISTORICAL  10/18/2016   IR ANGIOGRAM SELECTIVE EACH  ADDITIONAL VESSEL 10/18/2016 Greggory Keen, MD [facility]  . IR GENERIC HISTORICAL  10/18/2016   IR ANGIOGRAM SELECTIVE EACH ADDITIONAL VESSEL 10/18/2016 Greggory Keen, MD [facility]  . IR GENERIC HISTORICAL  10/05/2016   IR RADIOLOGIST EVAL & MGMT 10/05/2016 Greggory Keen, MD [facility]  . IR GENERIC HISTORICAL  11/03/2016   IR EMBO TUMOR ORGAN ISCHEMIA INFARCT INC GUIDE ROADMAPPING 11/03/2016 Arne Cleveland, MD [facility]  . IR GENERIC HISTORICAL  11/03/2016   IR ANGIOGRAM VISCERAL SELECTIVE 11/03/2016 Arne Cleveland, MD [facility]  . IR GENERIC HISTORICAL  11/03/2016   IR ANGIOGRAM SELECTIVE EACH ADDITIONAL VESSEL 11/03/2016 Arne Cleveland, MD [facility]  . IR GENERIC HISTORICAL  11/03/2016   IR US GUIDE VASC ACCESS RIGHT 11/03/2016 Arne Cleveland, MD [facility]  . IR GENERIC HISTORICAL  11/03/2016   IR ANGIOGRAM SELECTIVE EACH ADDITIONAL VESSEL 11/03/2016 Arne Cleveland, MD [facility]  . IR GENERIC HISTORICAL  11/03/2016   IR ANGIOGRAM SELECTIVE EACH ADDITIONAL VESSEL 11/03/2016 Arne Cleveland, MD [facility]  . IR GENERIC HISTORICAL  11/03/2016   IR ANGIOGRAM SELECTIVE EACH ADDITIONAL VESSEL 11/03/2016 Arne Cleveland, MD [facility]  . IR GENERIC HISTORICAL  07/07/2016   IR RADIOLOGIST EVAL & MGMT 07/07/2016 Greggory Keen, MD [facility]  . IR GENERIC HISTORICAL  05/18/2016   IR RADIOLOGIST EVAL & MGMT 05/18/2016 [facility]  . IR GENERIC HISTORICAL  12/08/2016   IR RADIOLOGIST EVAL & MGMT 12/08/2016 Greggory Keen, MD [facility]  . KNEE ARTHROSCOPY     Left x2  . NOSE SURGERY    . SHOULDER ARTHROSCOPY  06/27/2012   Procedure: ARTHROSCOPY SHOULDER;  Surgeon: Lorn Junes, MD;  Location: [facility];  Service: Orthopedics;  Laterality: Right;  right shoulder arthroscopy debridement extensive, distal clavulectomy, with resect adhesions with maniplulation  . TONSILLECTOMY    . TOTAL KNEE ARTHROPLASTY Left 07/02/2013    Procedure: TOTAL KNEE ARTHROPLASTY- left;  Surgeon: Lorn Junes, MD;  Location: [facility];  Service: Orthopedics;  Laterality: Left;  Marland Kitchen VARICOSE VEIN SURGERY     Right    Allergies: Asa [aspirin]; Adhesive [tape]; and Tylenol [acetaminophen]  Medications: Prior to Admission medications   Medication Sig Start Date End Date Taking? Authorizing Provider  amLODipine (NORVASC) 5 MG tablet Take 5 mg by mouth every morning.    Yes Historical Provider, MD  calcium-vitamin D (OSCAL WITH D) 250-125 MG-UNIT tablet Take 1 tablet by mouth daily.   Yes Historical Provider, MD  cycloSPORINE (RESTASIS) 0.05 % ophthalmic emulsion Place 1 drop into both eyes 2 (two) times daily.   Yes Historical Provider, MD  dexlansoprazole (DEXILANT) 60 MG capsule Take 60 mg by mouth every evening.    Yes Historical Provider, MD  docusate sodium (COLACE) 100 MG capsule Take 1 capsule (100 mg total) by mouth daily as needed for mild constipation. 01/20/16  Yes Saverio Danker, PA-C  DULoxetine (CYMBALTA) 60 MG capsule Take 60 mg by mouth every evening.  Yes Historical Provider, MD  fluticasone (FLONASE) 50 MCG/ACT nasal spray Place 1 spray into both nostrils 2 (two) times daily as needed for allergies or rhinitis.    Yes Historical Provider, MD  furosemide (LASIX) 20 MG tablet Take 20 mg by mouth daily. Reported on 02/10/2016 10/20/15  Yes Historical Provider, MD  gabapentin (NEURONTIN) 300 MG capsule Take 300 mg by mouth at bedtime.    Yes Historical Provider, MD  hydrochlorothiazide (MICROZIDE) 12.5 MG capsule Take 12.5 mg by mouth daily.    Yes Historical Provider, MD  insulin aspart protamine- aspart (NOVOLOG MIX 70/30) (70-30) 100 UNIT/ML injection Inject 8 Units into the skin daily with breakfast.   Yes Historical Provider, MD  insulin aspart protamine- aspart (NOVOLOG MIX 70/30) (70-30) 100 UNIT/ML injection Inject 10 Units into the skin daily with supper.   Yes Historical Provider, MD  levothyroxine (SYNTHROID,  LEVOTHROID) 112 MCG tablet Take 112 mcg by mouth daily. 12/22/15  Yes Historical Provider, MD  lovastatin (MEVACOR) 40 MG tablet Take 40 mg by mouth at bedtime.   Yes Historical Provider, MD  Multiple Vitamins-Minerals (CENTRUM SILVER PO) Take 1 tablet by mouth every morning.    Yes Historical Provider, MD  Olopatadine HCl (PATANASE) 0.6 % SOLN Place 1 each into the nose daily as needed (for allergies).   Yes Historical Provider, MD  Omega-3 Fatty Acids (FISH OIL PO) Take 1 tablet by mouth daily.   Yes Historical Provider, MD  ranitidine (ZANTAC) 150 MG tablet Take 150 mg by mouth at bedtime. Reported on 11/21/2015   Yes Historical Provider, MD  telmisartan (MICARDIS) 80 MG tablet Take 80 mg by mouth every morning.    Yes Historical Provider, MD  tetrahydrozoline (VISINE) 0.05 % ophthalmic solution Place 1 drop into both eyes 2 (two) times daily as needed (for allergies).    Yes Historical Provider, MD  esomeprazole (NEXIUM) 40 MG capsule Take 40 mg by mouth every morning.     Historical Provider, MD  metFORMIN (GLUCOPHAGE-XR) 500 MG 24 hr tablet Take 500 mg by mouth daily. 05/12/16   Historical Provider, MD     Family History  Problem Relation Age of Onset  . Heart disease Mother   . Hypertension Mother   . Stroke Mother   . Heart disease Father   . Heart attack Father   . Hypertension Father   . Heart disease Brother   . Heart attack Brother   . Heart attack Sister   . Hypertension Sister   . Stroke Brother     Social History   Social History  . Marital status: Widowed    Spouse name: N/A  . Number of children: N/A  . Years of education: N/A   Occupational History  . retired    Social History Main Topics  . Smoking status: Former Smoker    Packs/day: 0.50    Years: 35.00    Types: Cigarettes    Quit date: 12/26/1993  . Smokeless tobacco: Never Used  . Alcohol use Yes     Comment: very rarely wine, on holidays   . Drug use: No  . Sexual activity: Not on file   Other  Topics Concern  . Not on file   Social History Narrative  . No narrative on file    ECOG Status: 1 - Symptomatic but completely ambulatory  Review of Systems: A 12 point ROS discussed and pertinent positives are indicated in the HPI above.  All other systems are negative.  Review of  Systems  Vital Signs: BP (!) 138/55 (BP Location: Left Arm, Patient Position: Sitting, Cuff Size: Large)   Pulse 77   Temp 98 F (36.7 C) (Oral)   Resp 16   Ht 5' 4.25" (1.632 m)   Wt 228 lb (103.4 kg)   SpO2 96%   BMI 38.83 kg/m   Physical Exam  Constitutional: She is oriented to person, place, and time. She appears well-developed and well-nourished. No distress.  Eyes: Conjunctivae are normal. No scleral icterus.  Cardiovascular: Normal rate, regular rhythm and normal heart sounds.   No murmur heard. Pulmonary/Chest: Effort normal and breath sounds normal. She has no wheezes.  Abdominal: Soft. Bowel sounds are normal. She exhibits no distension and no mass. There is no tenderness.  Neurological: She is alert and oriented to person, place, and time.  Skin: She is not diaphoretic.  Psychiatric: She has a normal mood and affect. Her behavior is normal.      Imaging: Mr Abdomen Wwo Contrast  Result Date: 03/01/2017 CLINICAL DATA:  Hepatocellular carcinoma, 4 month follow up after treatment with yttrium 90 EXAM: MRI ABDOMEN WITHOUT AND WITH CONTRAST TECHNIQUE: Multiplanar multisequence MR imaging of the abdomen was performed both before and after the administration of intravenous contrast. CONTRAST:  10 cc Eovist COMPARISON:  10/05/2016 FINDINGS: Lower chest: Mild cardiomegaly. Hepatobiliary: The central hepatic mass involving segment 4 and extending into segments 5, eighth, 2, and 3 has enlarged, currently measuring 9.1 by 7.1 by 8.1 cm (volume = 270 cm^3), formerly approximately 5.5 by 5.6 by 4.8 cm (volume = 77 cm^3). 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 There is some central necrotic portion of the tumor with abnormal rim enhancement. The extrahepatic portion of the portal vein is not currently thrombosed. The portal vein thrombosis demonstrates some minimal enhancement suggesting at least a component of tumor thrombus. Pancreas:  Unremarkable Spleen: Splenomegaly, spleen measures 17.3 by 11.5 by 17.4 cm (volume = 1810 cm^3). Adrenals/Urinary Tract: Right kidney lower pole benign-appearing cyst. Adrenal glands unremarkable. Stomach/Bowel: Scattered colonic diverticula. Vascular/Lymphatic: Peripancreatic and porta hepatis adenopathy observed, a porta hepatis node measures 1.2 cm on image 30/9, formerly the same. There is edema along the porta hepatis likely related to the portal vein thrombosis. Most of the portal vein thrombosis is central and intrahepatic. Other:  Trace fluid along the margins of the liver and spleen. Musculoskeletal: No acute findings. IMPRESSION: 1. Significant enlargement of the central hepatic mass, with thrombus and tumor thrombus extending in the intrahepatic portal veins especially centrally, and with continued involvement of the middle hepatic vein. Tumor volume is estimated to have increased approximately 3.5 times since the prior scan. 2. Stable mild porta hepatis adenopathy. 3. Splenomegaly is mildly worsened. 4. Trace ascites especially along the liver margin. 5. Mild cardiomegaly. Electronically Signed   By: Van Clines M.D.   On: 03/01/2017 09:41    Labs:  CBC:  Recent Labs  05/31/16 1025 10/18/16 0902 11/03/16 0732 02/24/17 1106  WBC 4.1 2.6* 4.0 3.1*  HGB 11.8* 11.8* 11.3* 11.2*  HCT 38.2 36.8 34.8* 34.6*  PLT 90* 82* 89* 106*    COAGS:  Recent Labs  05/31/16 1025 10/18/16 0902 11/03/16 0732 02/24/17 1106  INR 1.05 1.05 1.06 1.1  APTT 31  --   --   --     BMP:  Recent Labs  05/31/16 1025 10/05/16 1029 10/18/16 0902 11/03/16 0732  02/24/17 1106  NA 140  --  137 140 140  K 4.0  --  3.7 3.7 3.9  CL 106  --  105 105 106  CO2 26  --  25 24 26   GLUCOSE 211*  --  169* 161* 160*  BUN 16  --  13 14 10   CALCIUM 8.5*  --  9.2 8.9 8.6  CREATININE 0.80 0.70 0.70 0.64 0.64  GFRNONAA >60  --  >60 >60  --   GFRAA >60  --  >60 >60  --     LIVER FUNCTION TESTS:  Recent Labs  05/31/16 1025 10/18/16 0902 11/03/16 0732 02/24/17 1106  BILITOT 1.0 0.7 0.7 0.9  AST 54* 50* 39 38*  ALT 60* 43 41 27  ALKPHOS 142* 133* 138* 141*  PROT 6.4* 6.9 6.9 6.4  ALBUMIN 3.6 3.6 3.4* 3.3*    TUMOR MARKERS:  Recent Labs  02/24/17 1106  AFPTM 2,301.8*    Assessment and Plan:  4 months status post left hepatic Y 90 radio embolization and 2 previous DEB TACE procedures over the last year. She returns for outpatient imaging. Unfortunately, today's MRI continues to show progression and enlargement of the central right liver hilar mass now with evidence of tumor thrombus in the portal vein but no biliary obstruction. Lesion roughly measures 7 x 9 cm. Despite this, she only reports mild fatigue without abdominal pain, right upper quadrant pain or signs of jaundice. AFP is also rising. She remains asymptomatic with an excellent functional status. Imaging findings reviewed with the patient today.  Plan: Refer back to oncology to discuss chemotherapy options since the 3 embolization procedures over 1 year have not been successful in local control.    Electronically Signed: Greggory Keen 03/01/2017, 1:58 PM   I spent a total of    25 Minutes in face to face in clinical consultation, greater than 50% of which was counseling/coordinating care for this patient with hepatocellular carcinoma.

## 2017-03-02 ENCOUNTER — Telehealth: Payer: Self-pay | Admitting: Hematology

## 2017-03-02 ENCOUNTER — Ambulatory Visit (HOSPITAL_BASED_OUTPATIENT_CLINIC_OR_DEPARTMENT_OTHER): Payer: Medicare Other | Admitting: Hematology

## 2017-03-02 VITALS — BP 138/59 | HR 80 | Temp 98.6°F | Resp 18 | Ht 64.25 in | Wt 228.9 lb

## 2017-03-02 DIAGNOSIS — C220 Liver cell carcinoma: Secondary | ICD-10-CM

## 2017-03-02 DIAGNOSIS — E039 Hypothyroidism, unspecified: Secondary | ICD-10-CM | POA: Diagnosis not present

## 2017-03-02 DIAGNOSIS — I10 Essential (primary) hypertension: Secondary | ICD-10-CM

## 2017-03-02 DIAGNOSIS — E119 Type 2 diabetes mellitus without complications: Secondary | ICD-10-CM | POA: Diagnosis not present

## 2017-03-02 DIAGNOSIS — K7460 Unspecified cirrhosis of liver: Secondary | ICD-10-CM

## 2017-03-02 NOTE — Telephone Encounter (Signed)
Appointments scheduled per 03/02/17 los. Patient was given a copy of the AVS report and appointment schedule per 03/02/17 los. °

## 2017-03-03 ENCOUNTER — Encounter: Payer: Self-pay | Admitting: Hematology

## 2017-03-14 ENCOUNTER — Ambulatory Visit: Payer: Medicare Other | Admitting: Hematology

## 2017-03-17 ENCOUNTER — Telehealth: Payer: Self-pay

## 2017-03-17 NOTE — Telephone Encounter (Signed)
Pt called stating she has not received her medicine yet. Pt does not know the name of the medicine.  Per OV note the medication is Sorafenib. This is not on MAR. Dr Burr Medico notified.  Pt stated it would have to go through [facility].

## 2017-03-18 ENCOUNTER — Other Ambulatory Visit: Payer: Self-pay | Admitting: Hematology

## 2017-03-18 MED ORDER — SORAFENIB TOSYLATE 200 MG PO TABS: 400.0000 mg | ORAL_TABLET | Freq: Two times a day (BID) | ORAL | 0 refills | Status: DC

## 2017-03-18 NOTE — Telephone Encounter (Signed)
Please let pt know that it's in the process now, and will keep her updated. Thanks   Truitt Merle MD

## 2017-03-18 NOTE — Progress Notes (Unsigned)
sorafenib

## 2017-03-21 DIAGNOSIS — E119 Type 2 diabetes mellitus without complications: Secondary | ICD-10-CM | POA: Diagnosis not present

## 2017-03-22 ENCOUNTER — Telehealth: Payer: Self-pay | Admitting: Pharmacist

## 2017-03-22 DIAGNOSIS — Z23 Encounter for immunization: Secondary | ICD-10-CM | POA: Diagnosis not present

## 2017-03-22 DIAGNOSIS — Z0000 Encounter for general adult medical examination without abnormal findings: Secondary | ICD-10-CM | POA: Diagnosis not present

## 2017-03-22 DIAGNOSIS — C220 Liver cell carcinoma: Secondary | ICD-10-CM

## 2017-03-22 DIAGNOSIS — E559 Vitamin D deficiency, unspecified: Secondary | ICD-10-CM | POA: Diagnosis not present

## 2017-03-22 DIAGNOSIS — N390 Urinary tract infection, site not specified: Secondary | ICD-10-CM | POA: Diagnosis not present

## 2017-03-22 DIAGNOSIS — R5383 Other fatigue: Secondary | ICD-10-CM | POA: Diagnosis not present

## 2017-03-22 DIAGNOSIS — I10 Essential (primary) hypertension: Secondary | ICD-10-CM | POA: Diagnosis not present

## 2017-03-22 DIAGNOSIS — E039 Hypothyroidism, unspecified: Secondary | ICD-10-CM | POA: Diagnosis not present

## 2017-03-22 DIAGNOSIS — E1122 Type 2 diabetes mellitus with diabetic chronic kidney disease: Secondary | ICD-10-CM | POA: Diagnosis not present

## 2017-03-22 DIAGNOSIS — E785 Hyperlipidemia, unspecified: Secondary | ICD-10-CM | POA: Diagnosis not present

## 2017-03-22 MED ORDER — SORAFENIB TOSYLATE 200 MG PO TABS: 400.0000 mg | ORAL_TABLET | Freq: Two times a day (BID) | ORAL | 0 refills | Status: DC

## 2017-03-22 NOTE — Telephone Encounter (Signed)
Oral Chemotherapy Pharmacist Encounter  Received notification from [facility] that per insurance requirement, Nexavar prescription would need to be sent to [facility]. 02/24/17 labs reviewed, OK for treatment Current medication list in Epic assessed, no DDIs with Nexavar identified.  Prescription has been e-scribed to [facility] in [place], [place].  Oral Oncology Clinic will continue to follow.   Johny Drilling, PharmD, BCPS, BCOP 03/22/2017  4:59 PM Oral Oncology Clinic [phone]

## 2017-03-29 NOTE — Telephone Encounter (Signed)
Oral Chemotherapy Pharmacist Encounter  I called [facility] at [phone] to follow-up on patient's Nexavar prescription  They confirmed they did receive the prescription on 5/15, however they have not processed prescription yet, patient advocate was not able to give me any reason why no movement has been made.  I provided [facility] ID number to patient advocate to get process moving. She stated they would reach out to the office if additional information is needed.   Oral Oncology Clinic will continue to follow.  Johny Drilling, PharmD, BCPS, BCOP 03/29/2017  4:31 PM Oral Oncology Clinic [phone]

## 2017-03-30 DIAGNOSIS — Z1231 Encounter for screening mammogram for malignant neoplasm of breast: Secondary | ICD-10-CM | POA: Diagnosis not present

## 2017-04-01 DIAGNOSIS — E039 Hypothyroidism, unspecified: Secondary | ICD-10-CM | POA: Diagnosis not present

## 2017-04-01 DIAGNOSIS — I10 Essential (primary) hypertension: Secondary | ICD-10-CM | POA: Diagnosis not present

## 2017-04-01 DIAGNOSIS — E119 Type 2 diabetes mellitus without complications: Secondary | ICD-10-CM | POA: Diagnosis not present

## 2017-04-01 DIAGNOSIS — C220 Liver cell carcinoma: Secondary | ICD-10-CM | POA: Diagnosis not present

## 2017-04-07 NOTE — Progress Notes (Signed)
[facility] [facility]  [phone] [phone]) [phone]  Clinic follow Up Note   Patient Care Team: Thressa Sheller, MD as PCP - General (Internal Medicine) Carol Ada, MD as Consulting Physician (Gastroenterology) Stark Klein, MD as Consulting Physician (General Surgery) Greggory Keen, MD as Consulting Physician (Interventional Radiology) 04/13/2017    CHIEF COMPLAINTS:  Follow up [facility]    Oncology History   Hepatocellular carcinoma [facility])   Staging form: Liver (Excluding Intrahepatic Bile Ducts), AJCC 7th Edition     Clinical stage from 12/01/2015: Stage I (T1, N0, M0) - Signed by Truitt Merle, MD on 04/12/2016       Hepatocellular carcinoma ([place])   10/27/2015 Imaging    Liver MRI with and without contrast showed a 4 cm lesion in the segment 8 liver, image characteristics  favor Harris. Mild hepatomegaly, no other metastasis.      11/21/2015 Initial Diagnosis    Hepatocellular carcinoma ([place])      12/01/2015 Initial Biopsy    Liver core needle biopsy showed hepatocellular carcinoma, moderately differentiated.      01/19/2016 Procedure    TACE to the right lobe liver cancer by Dr. Annamaria Boots       02/13/2016 Imaging    Abdominal MRI showed slightly smaller segment 8 liver lesion with reduced amount of central enhancement. Morphological findings of cirrhosis with background hepatic steatosis. Borderline prominent portal hepatis lymph node 1.3 cm, stable.      05/18/2016 Imaging    MRI abdomen with and without contrast 05/18/2016 IMPRESSION: No significant change in size of right hepatic lobe mass in segment 8. No new or progressive hepatic neoplasm identified. Stable mild porta hepatis lymphadenopathy measuring up to 1.3 cm. No new or progressive lymphadenopathy identified. Stable hepatic cirrhosis and findings of portal venous hypertension, including mild splenomegaly and upper abdominal portosystemic venous collaterals.      05/31/2016 Procedure    Repeat  visceral angiograms with peripheral right hepatic DEB- TACE to dominant right hepatic vasculature supplying the known hepatocellular carcinoma segment 8 on 05/31/16 by Dr. Annamaria Boots      10/05/2016 Imaging    MRI abdomen with and without contrast 10/05/2016 IMPRESSION: 1. Mild interval increase in size of central RIGHT hepatic lobe mass. 2. Interval extension of the tumor mass into the middle hepatic vein consistent with tumor progression. 3. Splenomegaly.  Portal vein is patent.  No ascites.      11/03/2016 Procedure    Y-90 radioembolization of the left hepatic lobe by Dr. Vernard Gambles on 11/03/16.      03/01/2017 Imaging    MRI abdomen with and without contrast 03/01/2017 IMPRESSION: 1. Significant enlargement of the central hepatic mass, with thrombus and tumor thrombus extending in the intrahepatic portal veins especially centrally, and with continued involvement of the middle hepatic vein. Tumor volume is estimated to have increased approximately 3.5 times since the prior scan. 2. Stable mild porta hepatis adenopathy. 3. Splenomegaly is mildly worsened. 4. Trace ascites especially along the liver margin. 5. Mild cardiomegaly.      03/28/2017 -  Chemotherapy    Sorafenib start at 200mg  bid, and increase by 200mg /day every 2 weeks (full dose 400mg  bid) if she tolerates well. Started 03/28/17  Due to tolerance issues she will reduce Sorafenib dose to 2 in the morning and 1 in the evening 1 hour before meal starting 04/13/17        HISTORY OF PRESENTING ILLNESS (11/21/2015):  Megan Horton 76 y.o. female is here because of recent abnormal scan finding  of a liver lesion.   She started having intermittent epigastric pain,  nausea, vomiting and weight loss 1.5 months ago, and presented to the ER on 10/10/2015 at [facility] for worsening symptoms. A CT scan was performed and she was noted to have 4.5 cm intralobar hypodense lesion in right love of liver and splenomegaly at 14.5 cm. There was  also noted dilation of her portal vein consistent with portal HTN. She was sent to [facility] for further evaluation.She underwent endoscopy by Dr. Benson Norway on 10/10/2015, which was unremarkable. Abdominal MRI was subsequently obtained on 10/27/2015, which showed a 4.0 x 4.1 cm lesion in segment 8 of right liver lobe, with imaging features hepatocellular carcinoma. 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  She still has epigastric pain after meal, good appetite, gained some weight back, no nausea, mild constipation. She denies hematochezia, change of bowel habits, or other symptoms. She lives alone and tolerates daily activities without much difficulty.  She denies prior history of liver disease, no significant alcohol drinking history, no blood transfusion. She had a screening colonoscopy on 05/23/2015 which showed 5 small polyps which were removed.  CURRENT THERAPY: Sorafenib start at 200mg  bid, and increase by 200mg /day every 2 weeks (full dose 400mg  bid) if she tolerates well. Started 03/28/17  -Due to tolerance issues she will reduce Sorafenib dose to 2 in the morning and 1 in the evening 1 hour before meal starting 04/13/17   INTERIM HISTORY:  [name] returns for follow up. She presents to the clinic today. She started her Sorafenib more than two weeks ago, She is now taking 2 in the morning and 2 at night. She is having trouble on it. When she sneezes or coughs her stomach hurts. When she eats something she will get abdominal pain and her left shoulder hurts her when she tries to lift it, like arthritis pain. This pain is everyday 10/10 and does not go away quickly. She will drink some milk and aleve. When taking Sorafenib 1 and 1 a day she still gets pain. She is eating a whole lot less because of pain and appetite loss. Since Sorafenib she has been tired all th time. She is will to try the pill again but will like to cut down.     MEDICAL HISTORY:  Past  Medical History:  Diagnosis Date  . Adhesive capsulitis of right shoulder   . Arthritis   . Complication of anesthesia 5/13   did have some disorientation post cerv fusion due to low sats  . Diabetes mellitus ([place])   . GERD (gastroesophageal reflux disease)   . Hyperlipemia   . Hypertension   . Hypothyroidism   . Left knee DJD   . Osteopenia   . Partial tear of rotator cuff(726.13) left  . Peripheral neuropathy    fingers   . Peripheral vascular disease ([place])   . Seasonal allergies   . Seasonal allergies   . Sleep apnea    CPAP, sleep study 3-4 years ago in [place], [place] Dr. Elbert Ewings   . Urgency of urination   . Wears glasses     SURGICAL HISTORY: Past Surgical History:  Procedure Laterality Date  . ABDOMINAL HYSTERECTOMY     complete  . ANTERIOR CERVICAL DECOMP/DISCECTOMY FUSION  03/10/2012   Procedure: ANTERIOR CERVICAL DECOMPRESSION/DISCECTOMY FUSION 1 LEVEL;  Surgeon: Eustace Moore, MD;  Location: [facility];  Service: Neurosurgery;  Laterality: N/A;  Cervical five-six anterior cervical decompression fusion with plating  .  CARPAL TUNNEL RELEASE     bilat  . CERVICAL FUSION    . CHOLECYSTECTOMY  2009  . COLONOSCOPY W/ POLYPECTOMY    . COLONOSCOPY WITH PROPOFOL N/A 05/23/2015   Procedure: COLONOSCOPY WITH PROPOFOL;  Surgeon: Carol Ada, MD;  Location: [facility];  Service: Endoscopy;  Laterality: N/A;  . ESOPHAGOGASTRODUODENOSCOPY (EGD) WITH PROPOFOL N/A 10/10/2015   Procedure: ESOPHAGOGASTRODUODENOSCOPY (EGD) WITH PROPOFOL;  Surgeon: Carol Ada, MD;  Location: [facility];  Service: Endoscopy;  Laterality: N/A;  . EYE SURGERY  2012   bilat cataract  . HEEL SPUR EXCISION     bilat  . HEMORRHOID SURGERY  2009  . IR GENERIC HISTORICAL  05/31/2016   IR ANGIOGRAM SELECTIVE EACH ADDITIONAL VESSEL 05/31/2016 Greggory Keen, MD [facility]  . IR GENERIC HISTORICAL  05/31/2016   IR ANGIOGRAM VISCERAL SELECTIVE 05/31/2016 Greggory Keen, MD [facility]  . IR  GENERIC HISTORICAL  05/31/2016   IR US GUIDE VASC ACCESS RIGHT 05/31/2016 Greggory Keen, MD [facility]  . IR GENERIC HISTORICAL  05/31/2016   IR ANGIOGRAM SELECTIVE EACH ADDITIONAL VESSEL 05/31/2016 Greggory Keen, MD [facility]  . IR GENERIC HISTORICAL  05/31/2016   IR EMBO TUMOR ORGAN ISCHEMIA INFARCT INC GUIDE ROADMAPPING 05/31/2016 Greggory Keen, MD [facility]  . IR GENERIC HISTORICAL  10/18/2016   IR ANGIOGRAM SELECTIVE EACH ADDITIONAL VESSEL 10/18/2016 Greggory Keen, MD [facility]  . IR GENERIC HISTORICAL  10/18/2016   IR ANGIOGRAM SELECTIVE EACH ADDITIONAL VESSEL 10/18/2016 Greggory Keen, MD [facility]  . IR GENERIC HISTORICAL  10/18/2016   IR US GUIDE VASC ACCESS RIGHT 10/18/2016 Greggory Keen, MD [facility]  . IR GENERIC HISTORICAL  10/18/2016   IR ANGIOGRAM VISCERAL SELECTIVE 10/18/2016 Greggory Keen, MD [facility]  . IR GENERIC HISTORICAL  10/18/2016   IR EMBO ARTERIAL NOT HEMORR HEMANG INC GUIDE ROADMAPPING 10/18/2016 Greggory Keen, MD [facility]  . IR GENERIC HISTORICAL  10/18/2016   IR ANGIOGRAM SELECTIVE EACH ADDITIONAL VESSEL 10/18/2016 Greggory Keen, MD [facility]  . IR GENERIC HISTORICAL  10/18/2016   IR ANGIOGRAM SELECTIVE EACH ADDITIONAL VESSEL 10/18/2016 Greggory Keen, MD [facility]  . IR GENERIC HISTORICAL  10/05/2016   IR RADIOLOGIST EVAL & MGMT 10/05/2016 Greggory Keen, MD [facility]  . IR GENERIC HISTORICAL  11/03/2016   IR EMBO TUMOR ORGAN ISCHEMIA INFARCT INC GUIDE ROADMAPPING 11/03/2016 Arne Cleveland, MD [facility]  . IR GENERIC HISTORICAL  11/03/2016   IR ANGIOGRAM VISCERAL SELECTIVE 11/03/2016 Arne Cleveland, MD [facility]  . IR GENERIC HISTORICAL  11/03/2016   IR ANGIOGRAM SELECTIVE EACH ADDITIONAL VESSEL 11/03/2016 Arne Cleveland, MD [facility]  . IR GENERIC HISTORICAL  11/03/2016   IR US GUIDE VASC ACCESS RIGHT 11/03/2016 Arne Cleveland, MD [facility]  . IR GENERIC HISTORICAL  11/03/2016   IR ANGIOGRAM  SELECTIVE EACH ADDITIONAL VESSEL 11/03/2016 Arne Cleveland, MD [facility]  . IR GENERIC HISTORICAL  11/03/2016   IR ANGIOGRAM SELECTIVE EACH ADDITIONAL VESSEL 11/03/2016 Arne Cleveland, MD [facility]  . IR GENERIC HISTORICAL  11/03/2016   IR ANGIOGRAM SELECTIVE EACH ADDITIONAL VESSEL 11/03/2016 Arne Cleveland, MD [facility]  . IR GENERIC HISTORICAL  07/07/2016   IR RADIOLOGIST EVAL & MGMT 07/07/2016 Greggory Keen, MD [facility]  . IR GENERIC HISTORICAL  05/18/2016   IR RADIOLOGIST EVAL & MGMT 05/18/2016 [facility]  . IR GENERIC HISTORICAL  12/08/2016   IR RADIOLOGIST EVAL & MGMT 12/08/2016 Greggory Keen, MD [facility]  . KNEE ARTHROSCOPY  Left x2  . NOSE SURGERY    . SHOULDER ARTHROSCOPY  06/27/2012   Procedure: ARTHROSCOPY SHOULDER;  Surgeon: Lorn Junes, MD;  Location: [facility];  Service: Orthopedics;  Laterality: Right;  right shoulder arthroscopy debridement extensive, distal clavulectomy, with resect adhesions with maniplulation  . TONSILLECTOMY    . TOTAL KNEE ARTHROPLASTY Left 07/02/2013   Procedure: TOTAL KNEE ARTHROPLASTY- left;  Surgeon: Lorn Junes, MD;  Location: [facility];  Service: Orthopedics;  Laterality: Left;  Marland Kitchen VARICOSE VEIN SURGERY     Right    SOCIAL HISTORY: Social History   Social History  . Marital status: Widowed    Spouse name: N/A  . Number of children: N/A  . Years of education: N/A   Occupational History  . retired    Social History Main Topics  . Smoking status: Former Smoker    Packs/day: 0.50    Years: 35.00    Types: Cigarettes    Quit date: 12/26/1993  . Smokeless tobacco: Never Used  . Alcohol use Yes     Comment: very rarely wine, on holidays   . Drug use: No  . Sexual activity: Not on file   Other Topics Concern  . Not on file   Social History Narrative  . No narrative on file    FAMILY HISTORY: Family History  Problem Relation Age of Onset  . Heart disease Mother   .  Hypertension Mother   . Stroke Mother   . Heart disease Father   . Heart attack Father   . Hypertension Father   . Heart disease Brother   . Heart attack Brother   . Heart attack Sister   . Hypertension Sister   . Stroke Brother     ALLERGIES:  is allergic to asa [aspirin]; adhesive [tape]; and tylenol [acetaminophen].  MEDICATIONS:  Current Outpatient Prescriptions  Medication Sig Dispense Refill  . alendronate (FOSAMAX) 70 MG tablet Take 70 mg by mouth once a week.     Marland Kitchen amLODipine (NORVASC) 5 MG tablet Take 5 mg by mouth every morning.     . B-D UF III MINI PEN NEEDLES 31G X 5 MM MISC     . BD PEN NEEDLE NANO U/F 32G X 4 MM MISC     . calcium-vitamin D (OSCAL WITH D) 250-125 MG-UNIT tablet Take 1 tablet by mouth daily.    Marland Kitchen COLCRYS 0.6 MG tablet Take 0.6 mg by mouth daily.     . cycloSPORINE (RESTASIS) 0.05 % ophthalmic emulsion Place 1 drop into both eyes 2 (two) times daily.    Marland Kitchen dexlansoprazole (DEXILANT) 60 MG capsule Take 60 mg by mouth every evening.     . docusate sodium (COLACE) 100 MG capsule Take 1 capsule (100 mg total) by mouth daily as needed for mild constipation. 10 capsule 0  . DULoxetine (CYMBALTA) 60 MG capsule Take 60 mg by mouth every evening.     Marland Kitchen esomeprazole (NEXIUM) 40 MG capsule Take 40 mg by mouth every morning.     . fluticasone (FLONASE) 50 MCG/ACT nasal spray Place 1 spray into both nostrils 2 (two) times daily as needed for allergies or rhinitis.     Marland Kitchen FREESTYLE LITE test strip     . furosemide (LASIX) 20 MG tablet Take 20 mg by mouth daily. Reported on 02/10/2016    . gabapentin (NEURONTIN) 300 MG capsule Take 300 mg by mouth at bedtime.     . hydrochlorothiazide (MICROZIDE) 12.5 MG capsule Take  12.5 mg by mouth daily.     . insulin aspart protamine- aspart (NOVOLOG MIX 70/30) (70-30) 100 UNIT/ML injection Inject 8 Units into the skin daily with breakfast.    . insulin aspart protamine- aspart (NOVOLOG MIX 70/30) (70-30) 100 UNIT/ML injection Inject  10 Units into the skin daily with supper.    . levothyroxine (SYNTHROID, LEVOTHROID) 112 MCG tablet Take 112 mcg by mouth daily.    Marland Kitchen lovastatin (MEVACOR) 40 MG tablet Take 40 mg by mouth at bedtime.    . Multiple Vitamins-Minerals (CENTRUM SILVER PO) Take 1 tablet by mouth every morning.     . Olopatadine HCl (PATANASE) 0.6 % SOLN Place 1 each into the nose daily as needed (for allergies).    . Omega-3 Fatty Acids (FISH OIL PO) Take 1 tablet by mouth daily.    . ranitidine (ZANTAC) 150 MG tablet Take 150 mg by mouth at bedtime. Reported on 11/21/2015    . SORAfenib (NEXAVAR) 200 MG tablet Take 2 tab in morning and 1 in evening. Give on an empty stomach 1 hour before or 2 hours after meals. 90 tablet 0  . telmisartan (MICARDIS) 80 MG tablet Take 80 mg by mouth every morning.     Marland Kitchen tetrahydrozoline (VISINE) 0.05 % ophthalmic solution Place 1 drop into both eyes 2 (two) times daily as needed (for allergies).     . traMADol (ULTRAM) 50 MG tablet Take 1 tablet (50 mg total) by mouth every 6 (six) hours as needed. 30 tablet 0   No current facility-administered medications for this visit.     REVIEW OF SYSTEMS:   Constitutional: Denies fevers, chills or abnormal night sweats (+) weight loss (+) loss of appetite (+) fatigue  Eyes: Denies blurriness of vision, double vision or watery eyes Ears, nose, mouth, throat, and face: Denies mucositis or sore throat Respiratory: Denies cough, dyspnea or wheezes Cardiovascular: Denies palpitation, chest discomfort (+) lower extremity swelling Gastrointestinal:  Denies nausea, heartburn or change in bowel habits (+) stomach pain  Skin: Denies abnormal skin rashes Lymphatics: Denies new lymphadenopathy or easy bruising Neurological:Denies numbness, tingling or new weaknesses MSK: (+) left shoulder pain Behavioral/Psych: Mood is stable, no new changes  All other systems were reviewed with the patient and are negative.  PHYSICAL EXAMINATION: ECOG PERFORMANCE  STATUS: 0  Vitals:   04/13/17 0946  BP: 137/62  Pulse: 77  Resp: 18  Temp: 99.3 F (37.4 C)   Filed Weights   04/13/17 0946  Weight: 221 lb 1.6 oz (100.3 kg)    GENERAL:alert, no distress and comfortable SKIN: skin color, texture, turgor are normal, no rashes or significant lesions EYES: normal, conjunctiva are pink and non-injected, sclera clear OROPHARYNX:no exudate, no erythema and lips, buccal mucosa, and tongue normal  NECK: supple, thyroid normal size, non-tender, without nodularity LYMPH:  no palpable lymphadenopathy in the cervical, axillary or inguinal LUNGS: clear to auscultation and percussion with normal breathing effort HEART: regular rate & rhythm and no murmurs and no lower extremity edema ABDOMEN:abdomen soft, non-tender and normal bowel sounds Musculoskeletal:no cyanosis of digits and no clubbing  PSYCH: alert & oriented x 3 with fluent speech NEURO: no focal motor/sensory deficits  LABORATORY DATA:  I have reviewed the data as listed CBC Latest Ref Rng & Units 04/13/2017 02/24/2017 11/03/2016  WBC 3.9 - 10.3 10e3/uL 4.8 3.1(L) 4.0  Hemoglobin 11.6 - 15.9 g/dL 11.8 11.2(L) 11.3(L)  Hematocrit 34.8 - 46.6 % 35.3 34.6(L) 34.8(L)  Platelets 145 - 400 10e3/uL 115(L) 106(L)  89(L)    Recent Labs  05/31/16 1025  10/18/16 0902 11/03/16 0732 02/24/17 1106 04/13/17 0833  NA 140  --  137 140 140 132*  K 4.0  --  3.7 3.7 3.9 4.4  CL 106  --  105 105 106  --   CO2 26  --  25 24 26 22   GLUCOSE 211*  --  169* 161* 160* 166*  BUN 16  --  13 14 10  11.8  CREATININE 0.80  < > 0.70 0.64 0.64 0.8  CALCIUM 8.5*  --  9.2 8.9 8.6 8.7  GFRNONAA >60  --  >60 >60  --   --   GFRAA >60  --  >60 >60  --   --   PROT 6.4*  --  6.9 6.9 6.4 7.4  ALBUMIN 3.6  --  3.6 3.4* 3.3* 2.5*  AST 54*  --  50* 39 38* 61*  ALT 60*  --  43 41 27 35  ALKPHOS 142*  --  133* 138* 141* 211*  BILITOT 1.0  --  0.7 0.7 0.9 1.76*  < > = values in this interval not displayed.   AFP 11/21/15:  124.5 04/12/16: 100.5 02/24/17: 2302.8 04/13/17: PENDING  PATHOLOGY REPORT  Diagnosis 12/01/2015 Liver, needle/core biopsy, central right mass HEPATOCELLULAR CARCINOMA, MODERATELY DIFFERENTIATED. Microscopic Comment The biopsy shows diffuse malignant lesions which composed of well and moderately differentiated neoplastic hepatocytes. The carcinoma stains focal positive for hepar-1, negative for ck7. Reticulin and cd 34 highlights the proliferation pattern. The morphology and immunostaining pattern support the above diagnosis. This case also reviewed by Dr. Saralyn Pilar and agree.   RADIOGRAPHIC STUDIES: I have personally reviewed the radiological images as listed and agreed with the findings in the report.   CT Chest WO Contrast 04/13/17 PENDING   MRI abdomen with and without contrast 03/01/2017 IMPRESSION: 1. Significant enlargement of the central hepatic mass, with thrombus and tumor thrombus extending in the intrahepatic portal veins especially centrally, and with continued involvement of the middle hepatic vein. Tumor volume is estimated to have increased approximately 3.5 times since the prior scan. 2. Stable mild porta hepatis adenopathy. 3. Splenomegaly is mildly worsened. 4. Trace ascites especially along the liver margin. 5. Mild cardiomegaly.  MRI abdomen with and without contrast 10/05/2016 IMPRESSION: 1. Mild interval increase in size of central RIGHT hepatic lobe mass. 2. Interval extension of the tumor mass into the middle hepatic vein consistent with tumor progression. 3. Splenomegaly.  Portal vein is patent.  No ascites.    ASSESSMENT & PLAN: 76 y.o. female with PMH of HTN, DM, arthritis, hypothyroidism, obesity, buthistory of liver disease or heavy drinking history, presented with epigastric pain, nausea, and weight loss. Initial abdominal CT and MRI showed a 4.1 cm mass in the right lobe of liver  1. Hepatocellular carcinoma of rght lobe, initial stage I, disease  progression in 09/2016 -I previously reviewed the CT and MRI findings with pt in details. The liver lesion was very concerning for malignancy, especially hepatocellular carcinoma. Although she does not have history of liver disease, image also showed portal hypertension and a mild liver cirrhosis, which is her high risk factor for [place].  -I previously reviewed her liver mass biopsy result, which confirmed [place].  -CT chest on 11/25/15 was negative for metastasis. This was previously discussed with patient -She is status post TACE by interventional radiology Dr. Annamaria Boots on 01/19/16, posttreatment MRI on 02/13/16 showed good partial response.  -MRI of the abd on 05/18/16 was  stable. -The patient had repeat visceral angiograms with peripheral right hepatic DEB- TACE todominant right hepatic vasculature supplying the known hepatocellular carcinoma segment 8 on 05/31/16 by Dr. Annamaria Boots. -MRI of the abd on 10/05/16 showed mild interval increase of size of the central right hepatic lobe mass and interval extension of the tumor mass into the middle hepatic vein consistent with tumor progression. -The patient underwent Y-90 radioembolization to the LEFT hepatic lobe by Dr. Vernard Gambles on 11/03/16. -MRI of the abd on 03/01/17 showed significant enlargement of the central hepatic mass with thrombus and tumor thrombus extending in the intrahepatic portal veins. Tumor volume is estimated to have increased approximately 3.5 times since the prior scan. Stable mild porta hepatis adenopathy. Splenomegaly is mildly worsened. Dr. Annamaria Boots does not think she is a candidate for more liver targeted therapy  -The patient's AFP has significantly increased from 100.5 in June 2017 to 2302.8 in April 2018. -she has started Sorafenib, but did not tolerated well, with abdominal pain, fatigue and anorexia -Due to tolerance issues she will reduce Sorafenib dose to 2 in the morning and 1 in the evening 1 hour before meal starting 04/13/17. If intolerance  continues I will change her treatment. -Labs reviewed and Bilirubin slightly elevated, platelets and anemia improved. We'll continue monitoring. -Close follow-up in 2 weeks.  2. Liver steatosis and early liver cirrhosis  -Her liver MRI on 02/13/16 showed mild hepatomegaly, steatosis and probable early cirrhosis -Her liver function is normal, no history of liver disease. -She will continue follow-up with Dr. Benson Norway  3. HTN, DM, hypothyroidism -She'll continue follow-up with her primary care physician -The patient is now off Metformin and is taking insulin.  4. Abdominal and shoulder pain -Intermittent, but some time pain is 10/10 and will give her a prescription of Tramadol  PLAN -Due to tolerance issues she will reduce Sorafenib dose to 2 tab in the morning and 1 in the evening 1 hour before meal starting 04/13/17 -I give her a prescription of Tramadol  -Labs and F/u on 6/18 11:45am    All questions were answered. The patient knows to call the clinic with any problems, questions or concerns.  I spent 25 minutes counseling the patient face to face. The total time spent in the appointment was 30 minutes and more than 50% was on counseling.     Truitt Merle, MD 04/13/2017   This document serves as a record of services personally performed by Truitt Merle, MD. It was created on her behalf by Joslyn Devon, a trained medical scribe. The creation of this record is based on the scribe's personal observations and the provider's statements to them. This document has been checked and approved by the attending provider.

## 2017-04-13 ENCOUNTER — Encounter (HOSPITAL_COMMUNITY): Payer: Self-pay

## 2017-04-13 ENCOUNTER — Ambulatory Visit (HOSPITAL_COMMUNITY)
Admission: RE | Admit: 2017-04-13 | Discharge: 2017-04-13 | Disposition: A | Discharge status: Home / Self Care | Payer: Medicare Other | Source: Ambulatory Visit | Attending: Hematology | Admitting: Hematology

## 2017-04-13 ENCOUNTER — Ambulatory Visit (HOSPITAL_BASED_OUTPATIENT_CLINIC_OR_DEPARTMENT_OTHER): Payer: Medicare Other | Admitting: Hematology

## 2017-04-13 ENCOUNTER — Other Ambulatory Visit (HOSPITAL_BASED_OUTPATIENT_CLINIC_OR_DEPARTMENT_OTHER): Payer: Medicare Other

## 2017-04-13 ENCOUNTER — Telehealth: Payer: Self-pay | Admitting: Hematology

## 2017-04-13 VITALS — BP 137/62 | HR 77 | Temp 99.3°F | Resp 18 | Ht 64.25 in | Wt 221.1 lb

## 2017-04-13 DIAGNOSIS — C220 Liver cell carcinoma: Secondary | ICD-10-CM

## 2017-04-13 DIAGNOSIS — D649 Anemia, unspecified: Secondary | ICD-10-CM | POA: Diagnosis not present

## 2017-04-13 DIAGNOSIS — I2510 Atherosclerotic heart disease of native coronary artery without angina pectoris: Secondary | ICD-10-CM | POA: Insufficient documentation

## 2017-04-13 DIAGNOSIS — E039 Hypothyroidism, unspecified: Secondary | ICD-10-CM | POA: Diagnosis not present

## 2017-04-13 LAB — COMPREHENSIVE METABOLIC PANEL
ALT: 35 U/L (ref 0–55)
ANION GAP: 7 meq/L (ref 3–11)
AST: 61 U/L — ABNORMAL HIGH (ref 5–34)
Albumin: 2.5 g/dL — ABNORMAL LOW (ref 3.5–5.0)
Alkaline Phosphatase: 211 U/L — ABNORMAL HIGH (ref 40–150)
BUN: 11.8 mg/dL (ref 7.0–26.0)
CHLORIDE: 103 meq/L (ref 98–109)
CO2: 22 meq/L (ref 22–29)
Calcium: 8.7 mg/dL (ref 8.4–10.4)
Creatinine: 0.8 mg/dL (ref 0.6–1.1)
EGFR: 76 mL/min/{1.73_m2} — AB (ref >90)
Glucose: 166 mg/dl — ABNORMAL HIGH (ref 70–140)
Potassium: 4.4 mEq/L (ref 3.5–5.1)
SODIUM: 132 meq/L — AB (ref 136–145)
Total Bilirubin: 1.76 mg/dL — ABNORMAL HIGH (ref 0.20–1.20)
Total Protein: 7.4 g/dL (ref 6.4–8.3)

## 2017-04-13 LAB — CBC WITH DIFFERENTIAL/PLATELET
BASO%: 1.3 % (ref 0.0–2.0)
Basophils Absolute: 0.1 10*3/uL (ref 0.0–0.1)
EOS%: 4 % (ref 0.0–7.0)
Eosinophils Absolute: 0.2 10*3/uL (ref 0.0–0.5)
HCT: 35.3 % (ref 34.8–46.6)
HGB: 11.8 g/dL (ref 11.6–15.9)
LYMPH%: 13.6 % — AB (ref 14.0–49.7)
MCH: 28.2 pg (ref 25.1–34.0)
MCHC: 33.5 g/dL (ref 31.5–36.0)
MCV: 84.2 fL (ref 79.5–101.0)
MONO#: 0.6 10*3/uL (ref 0.1–0.9)
MONO%: 13.6 % (ref 0.0–14.0)
NEUT#: 3.2 10*3/uL (ref 1.5–6.5)
NEUT%: 67.5 % (ref 38.4–76.8)
PLATELETS: 115 10*3/uL — AB (ref 145–400)
RBC: 4.19 10*6/uL (ref 3.70–5.45)
RDW: 15.5 % — ABNORMAL HIGH (ref 11.2–14.5)
WBC: 4.8 10*3/uL (ref 3.9–10.3)
lymph#: 0.6 10*3/uL — ABNORMAL LOW (ref 0.9–3.3)

## 2017-04-13 MED ORDER — TRAMADOL HCL 50 MG PO TABS: 50.0000 mg | ORAL_TABLET | Freq: Four times a day (QID) | ORAL | 0 refills | Status: DC | PRN

## 2017-04-13 MED ORDER — SORAFENIB TOSYLATE 200 MG PO TABS: ORAL_TABLET | ORAL | 0 refills | Status: DC

## 2017-04-13 NOTE — Telephone Encounter (Signed)
Appointments scheduled, per 04/13/17 los. Patient was given a copy of the AVS report and appointment schedule, per 04/13/17 los.

## 2017-04-14 LAB — AFP TUMOR MARKER

## 2017-04-21 NOTE — Telephone Encounter (Signed)
Oral Chemotherapy Pharmacist Encounter  Received notification from [facility] division that [place] prescription had mailed from pharmacy to patient on 6/12 for delivery on 04/20/17.  Oral Oncology Clinic will continue to follow.  Johny Drilling, PharmD, BCPS, BCOP 04/21/2017  1:56 PM Oral Oncology Clinic [phone]

## 2017-04-21 NOTE — Progress Notes (Signed)
[place]  [phone] [phone]  Clinic follow Up Note   Patient Care Team: Thressa Sheller, MD as PCP - General (Internal Medicine) Carol Ada, MD as Consulting Physician (Gastroenterology) Stark Klein, MD as Consulting Physician (General Surgery) Greggory Keen, MD as Consulting Physician (Interventional Radiology) 04/25/2017    CHIEF COMPLAINTS:  Follow up [facility]    Oncology History   Hepatocellular carcinoma ([place])   Staging form: Liver (Excluding Intrahepatic Bile Ducts), AJCC 7th Edition     Clinical stage from 12/01/2015: Stage I (T1, N0, M0) - Signed by Truitt Merle, MD on 04/12/2016       Hepatocellular carcinoma ([place])   10/27/2015 Imaging    Liver MRI with and without contrast showed a 4 cm lesion in the segment 8 liver, image characteristics  favor HCC. Mild hepatomegaly, no other metastasis.      11/21/2015 Initial Diagnosis    Hepatocellular carcinoma ([place])      12/01/2015 Initial Biopsy    Liver core needle biopsy showed hepatocellular carcinoma, moderately differentiated.      01/19/2016 Procedure    TACE to the right lobe liver cancer by Dr. Annamaria Boots       02/13/2016 Imaging    Abdominal MRI showed slightly smaller segment 8 liver lesion with reduced amount of central enhancement. Morphological findings of cirrhosis with background hepatic steatosis. Borderline prominent portal hepatis lymph node 1.3 cm, stable.      05/18/2016 Imaging    MRI abdomen with and without contrast 05/18/2016 IMPRESSION: No significant change in size of right hepatic lobe mass in segment 8. No new or progressive hepatic neoplasm identified. Stable mild porta hepatis lymphadenopathy measuring up to 1.3 cm. No new or progressive lymphadenopathy identified. Stable hepatic cirrhosis and findings of portal venous hypertension, including mild splenomegaly and upper abdominal portosystemic venous collaterals.      05/31/2016 Procedure    Repeat  visceral angiograms with peripheral right hepatic DEB- TACE to dominant right hepatic vasculature supplying the known hepatocellular carcinoma segment 8 on 05/31/16 by Dr. Annamaria Boots      10/05/2016 Imaging    MRI abdomen with and without contrast 10/05/2016 IMPRESSION: 1. Mild interval increase in size of central RIGHT hepatic lobe mass. 2. Interval extension of the tumor mass into the middle hepatic vein consistent with tumor progression. 3. Splenomegaly.  Portal vein is patent.  No ascites.      11/03/2016 Procedure    Y-90 radioembolization of the left hepatic lobe by Dr. Vernard Gambles on 11/03/16.      03/01/2017 Imaging    MRI abdomen with and without contrast 03/01/2017 IMPRESSION: 1. Significant enlargement of the central hepatic mass, with thrombus and tumor thrombus extending in the intrahepatic portal veins especially centrally, and with continued involvement of the middle hepatic vein. Tumor volume is estimated to have increased approximately 3.5 times since the prior scan. 2. Stable mild porta hepatis adenopathy. 3. Splenomegaly is mildly worsened. 4. Trace ascites especially along the liver margin. 5. Mild cardiomegaly.      03/28/2017 -  Chemotherapy    Sorafenib start at 200mg  bid, and increase by 200mg /day every 2 weeks (full dose 400mg  bid) if she tolerates well. Started 03/28/17  Due to tolerance issues she will reduce Sorafenib dose to 2 in the morning and 1 in the evening 1 hour before meal starting 04/13/17        HISTORY OF PRESENTING ILLNESS (11/21/2015):  Megan Horton 76 y.o. female is here because of recent abnormal scan finding  of a liver lesion.   She started having intermittent epigastric pain,  nausea, vomiting and weight loss 1.5 months ago, and presented to the ER on 10/10/2015 at [facility] for worsening symptoms. A CT scan was performed and she was noted to have 4.5 cm intralobar hypodense lesion in right love of liver and splenomegaly at 14.5 cm. There was  also noted dilation of her portal vein consistent with portal HTN. She was sent to [facility] for further evaluation.She underwent endoscopy by Dr. Benson Norway on 10/10/2015, which was unremarkable. Abdominal MRI was subsequently obtained on 10/27/2015, which showed a 4.0 x 4.1 cm lesion in segment 8 of right liver lobe, with imaging features hepatocellular carcinoma. 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  She still has epigastric pain after meal, good appetite, gained some weight back, no nausea, mild constipation. She denies hematochezia, change of bowel habits, or other symptoms. She lives alone and tolerates daily activities without much difficulty.  She denies prior history of liver disease, no significant alcohol drinking history, no blood transfusion. She had a screening colonoscopy on 05/23/2015 which showed 5 small polyps which were removed.  CURRENT THERAPY: Sorafenib start at 200mg  bid, and increase by 200mg /day every 2 weeks (full dose 400mg  bid) if she tolerates well. Started 03/28/17. Due to tolerance issues she will reduce Sorafenib dose to 2 in the morning and 1 in the evening 1 hour before meal starting 04/13/17, will try 400mg  bid again on 04/26/2017  INTERIM HISTORY:  Megan Horton returns for follow up. She presents to the clinic today with her son. She reports to having a rash on her left chin and up her face. She does not feel warm on her face. She also have rash on her chest which is from her liver cirrhosis. Her skin on her body feels dry and scaly. She has been using moisturizing.  Her son reports her rash caused her to be blood red and her skin looked molten. She was hot to the touch.   She complains about back pain and ULQ to the [place]. The pain comes when she eats something so she cuts back on eating. She gets pain as well from not eating enough. The pain is 2-3/10. She uses aleve for the pain. She was prescribed tramadol but her son reports sodium  upsets her stomach. She also uses Zantac twice a day.   She also reports to having Diarrhea after treatment which is not a regular BM.  She believes her energy level is lower. Her son said they cannot go out as much because she gets too tired or winded.   She feels overall she can tolerate the pill.    MEDICAL HISTORY:  Past Medical History:  Diagnosis Date  . Adhesive capsulitis of right shoulder   . Arthritis   . Complication of anesthesia 5/13   did have some disorientation post cerv fusion due to low sats  . Diabetes mellitus ([place])   . GERD (gastroesophageal reflux disease)   . Hyperlipemia   . Hypertension   . Hypothyroidism   . Left knee DJD   . Osteopenia   . Partial tear of rotator cuff(726.13) left  . Peripheral neuropathy    fingers   . Peripheral vascular disease ([place])   . Seasonal allergies   . Seasonal allergies   . Sleep apnea    CPAP, sleep study 3-4 years ago in [place], [place] Dr. Elbert Ewings   . Urgency of urination   .  Wears glasses     SURGICAL HISTORY: Past Surgical History:  Procedure Laterality Date  . ABDOMINAL HYSTERECTOMY     complete  . ANTERIOR CERVICAL DECOMP/DISCECTOMY FUSION  03/10/2012   Procedure: ANTERIOR CERVICAL DECOMPRESSION/DISCECTOMY FUSION 1 LEVEL;  Surgeon: Eustace Moore, MD;  Location: [facility];  Service: Neurosurgery;  Laterality: N/A;  Cervical five-six anterior cervical decompression fusion with plating  . CARPAL TUNNEL RELEASE     bilat  . CERVICAL FUSION    . CHOLECYSTECTOMY  2009  . COLONOSCOPY W/ POLYPECTOMY    . COLONOSCOPY WITH PROPOFOL N/A 05/23/2015   Procedure: COLONOSCOPY WITH PROPOFOL;  Surgeon: Carol Ada, MD;  Location: [facility];  Service: Endoscopy;  Laterality: N/A;  . ESOPHAGOGASTRODUODENOSCOPY (EGD) WITH PROPOFOL N/A 10/10/2015   Procedure: ESOPHAGOGASTRODUODENOSCOPY (EGD) WITH PROPOFOL;  Surgeon: Carol Ada, MD;  Location: [facility];  Service: Endoscopy;  Laterality: N/A;  . EYE SURGERY  2012     bilat cataract  . HEEL SPUR EXCISION     bilat  . HEMORRHOID SURGERY  2009  . IR GENERIC HISTORICAL  05/31/2016   IR ANGIOGRAM SELECTIVE EACH ADDITIONAL VESSEL 05/31/2016 Greggory Keen, MD [facility]  . IR GENERIC HISTORICAL  05/31/2016   IR ANGIOGRAM VISCERAL SELECTIVE 05/31/2016 Greggory Keen, MD [facility]  . IR GENERIC HISTORICAL  05/31/2016   IR US GUIDE VASC ACCESS RIGHT 05/31/2016 Greggory Keen, MD [facility]  . IR GENERIC HISTORICAL  05/31/2016   IR ANGIOGRAM SELECTIVE EACH ADDITIONAL VESSEL 05/31/2016 Greggory Keen, MD [facility]  . IR GENERIC HISTORICAL  05/31/2016   IR EMBO TUMOR ORGAN ISCHEMIA INFARCT INC GUIDE ROADMAPPING 05/31/2016 Greggory Keen, MD [facility]  . IR GENERIC HISTORICAL  10/18/2016   IR ANGIOGRAM SELECTIVE EACH ADDITIONAL VESSEL 10/18/2016 Greggory Keen, MD [facility]  . IR GENERIC HISTORICAL  10/18/2016   IR ANGIOGRAM SELECTIVE EACH ADDITIONAL VESSEL 10/18/2016 Greggory Keen, MD [facility]  . IR GENERIC HISTORICAL  10/18/2016   IR US GUIDE VASC ACCESS RIGHT 10/18/2016 Greggory Keen, MD [facility]  . IR GENERIC HISTORICAL  10/18/2016   IR ANGIOGRAM VISCERAL SELECTIVE 10/18/2016 Greggory Keen, MD [facility]  . IR GENERIC HISTORICAL  10/18/2016   IR EMBO ARTERIAL NOT HEMORR HEMANG INC GUIDE ROADMAPPING 10/18/2016 Greggory Keen, MD [facility]  . IR GENERIC HISTORICAL  10/18/2016   IR ANGIOGRAM SELECTIVE EACH ADDITIONAL VESSEL 10/18/2016 Greggory Keen, MD [facility]  . IR GENERIC HISTORICAL  10/18/2016   IR ANGIOGRAM SELECTIVE EACH ADDITIONAL VESSEL 10/18/2016 Greggory Keen, MD [facility]  . IR GENERIC HISTORICAL  10/05/2016   IR RADIOLOGIST EVAL & MGMT 10/05/2016 Greggory Keen, MD [facility]  . IR GENERIC HISTORICAL  11/03/2016   IR EMBO TUMOR ORGAN ISCHEMIA INFARCT INC GUIDE ROADMAPPING 11/03/2016 Arne Cleveland, MD [facility]  . IR GENERIC HISTORICAL  11/03/2016   IR ANGIOGRAM VISCERAL SELECTIVE 11/03/2016 Arne Cleveland, MD [facility]  . IR GENERIC HISTORICAL  11/03/2016   IR ANGIOGRAM SELECTIVE EACH ADDITIONAL VESSEL 11/03/2016 Arne Cleveland, MD [facility]  . IR GENERIC HISTORICAL  11/03/2016   IR US GUIDE VASC ACCESS RIGHT 11/03/2016 Arne Cleveland, MD [facility]  . IR GENERIC HISTORICAL  11/03/2016   IR ANGIOGRAM SELECTIVE EACH ADDITIONAL VESSEL 11/03/2016 Arne Cleveland, MD [facility]  . IR GENERIC HISTORICAL  11/03/2016   IR ANGIOGRAM SELECTIVE EACH ADDITIONAL VESSEL 11/03/2016 Arne Cleveland, MD [facility]  . IR GENERIC HISTORICAL  11/03/2016   IR ANGIOGRAM SELECTIVE EACH ADDITIONAL VESSEL 11/03/2016  Arne Cleveland, MD [facility]  . IR GENERIC HISTORICAL  07/07/2016   IR RADIOLOGIST EVAL & MGMT 07/07/2016 Greggory Keen, MD [facility]  . IR GENERIC HISTORICAL  05/18/2016   IR RADIOLOGIST EVAL & MGMT 05/18/2016 [facility]  . IR GENERIC HISTORICAL  12/08/2016   IR RADIOLOGIST EVAL & MGMT 12/08/2016 Greggory Keen, MD [facility]  . IR RADIOLOGIST EVAL & MGMT  03/01/2017  . KNEE ARTHROSCOPY     Left x2  . NOSE SURGERY    . SHOULDER ARTHROSCOPY  06/27/2012   Procedure: ARTHROSCOPY SHOULDER;  Surgeon: Lorn Junes, MD;  Location: [facility];  Service: Orthopedics;  Laterality: Right;  right shoulder arthroscopy debridement extensive, distal clavulectomy, with resect adhesions with maniplulation  . TONSILLECTOMY    . TOTAL KNEE ARTHROPLASTY Left 07/02/2013   Procedure: TOTAL KNEE ARTHROPLASTY- left;  Surgeon: Lorn Junes, MD;  Location: [facility];  Service: Orthopedics;  Laterality: Left;  Marland Kitchen VARICOSE VEIN SURGERY     Right    SOCIAL HISTORY: Social History   Social History  . Marital status: Widowed    Spouse name: N/A  . Number of children: N/A  . Years of education: N/A   Occupational History  . retired    Social History Main Topics  . Smoking status: Former Smoker    Packs/day: 0.50    Years: 35.00    Types: Cigarettes    Quit  date: 12/26/1993  . Smokeless tobacco: Never Used  . Alcohol use Yes     Comment: very rarely wine, on holidays   . Drug use: No  . Sexual activity: Not on file   Other Topics Concern  . Not on file   Social History Narrative  . No narrative on file    FAMILY HISTORY: Family History  Problem Relation Age of Onset  . Heart disease Mother   . Hypertension Mother   . Stroke Mother   . Heart disease Father   . Heart attack Father   . Hypertension Father   . Heart disease Brother   . Heart attack Brother   . Heart attack Sister   . Hypertension Sister   . Stroke Brother     ALLERGIES:  is allergic to asa [aspirin]; adhesive [tape]; and tylenol [acetaminophen].  MEDICATIONS:  Current Outpatient Prescriptions  Medication Sig Dispense Refill  . alendronate (FOSAMAX) 70 MG tablet Take 70 mg by mouth once a week.     Marland Kitchen amLODipine (NORVASC) 5 MG tablet Take 5 mg by mouth every morning.     . B-D UF III MINI PEN NEEDLES 31G X 5 MM MISC     . BD PEN NEEDLE NANO U/F 32G X 4 MM MISC     . calcium-vitamin D (OSCAL WITH D) 250-125 MG-UNIT tablet Take 1 tablet by mouth daily.    Marland Kitchen COLCRYS 0.6 MG tablet Take 0.6 mg by mouth daily.     . cycloSPORINE (RESTASIS) 0.05 % ophthalmic emulsion Place 1 drop into both eyes 2 (two) times daily.    Marland Kitchen dexlansoprazole (DEXILANT) 60 MG capsule Take 60 mg by mouth every evening.     . docusate sodium (COLACE) 100 MG capsule Take 1 capsule (100 mg total) by mouth daily as needed for mild constipation. 10 capsule 0  . DULoxetine (CYMBALTA) 60 MG capsule Take 60 mg by mouth every evening.     Marland Kitchen esomeprazole (NEXIUM) 40 MG capsule Take 40 mg by mouth every morning.     Marland Kitchen  fluticasone (FLONASE) 50 MCG/ACT nasal spray Place 1 spray into both nostrils 2 (two) times daily as needed for allergies or rhinitis.     Marland Kitchen FREESTYLE LITE test strip     . furosemide (LASIX) 20 MG tablet Take 20 mg by mouth daily. Reported on 02/10/2016    . gabapentin (NEURONTIN) 300 MG  capsule Take 300 mg by mouth at bedtime.     . hydrochlorothiazide (MICROZIDE) 12.5 MG capsule Take 12.5 mg by mouth daily.     . insulin aspart protamine- aspart (NOVOLOG MIX 70/30) (70-30) 100 UNIT/ML injection Inject 8 Units into the skin daily with breakfast.    . insulin aspart protamine- aspart (NOVOLOG MIX 70/30) (70-30) 100 UNIT/ML injection Inject 10 Units into the skin daily with supper.    . levothyroxine (SYNTHROID, LEVOTHROID) 112 MCG tablet Take 112 mcg by mouth daily.    Marland Kitchen lovastatin (MEVACOR) 40 MG tablet Take 40 mg by mouth at bedtime.    . Multiple Vitamins-Minerals (CENTRUM SILVER PO) Take 1 tablet by mouth every morning.     . Olopatadine HCl (PATANASE) 0.6 % SOLN Place 1 each into the nose daily as needed (for allergies).    . Omega-3 Fatty Acids (FISH OIL PO) Take 1 tablet by mouth daily.    . ranitidine (ZANTAC) 150 MG tablet Take 150 mg by mouth at bedtime. Reported on 11/21/2015    . SORAfenib (NEXAVAR) 200 MG tablet Take 2 tab in morning and 1 in evening. Give on an empty stomach 1 hour before or 2 hours after meals. 90 tablet 0  . telmisartan (MICARDIS) 80 MG tablet Take 80 mg by mouth every morning.     Marland Kitchen tetrahydrozoline (VISINE) 0.05 % ophthalmic solution Place 1 drop into both eyes 2 (two) times daily as needed (for allergies).     . traMADol (ULTRAM) 50 MG tablet Take 1 tablet (50 mg total) by mouth every 6 (six) hours as needed. 30 tablet 0   No current facility-administered medications for this visit.     REVIEW OF SYSTEMS:   Constitutional: Denies fevers, chills or abnormal night sweats (+) low appetite (+) fatigue  Eyes: Denies blurriness of vision, double vision or watery eyes Ears, nose, mouth, throat, and face: Denies mucositis or sore throat Respiratory: Denies cough, dyspnea or wheezes Cardiovascular: Denies palpitation, chest discomfort (+) lower extremity swelling Gastrointestinal:  Denies nausea, heartburn or change in bowel habits (+) URQ and ULQ  pain  Skin: (+) face and arm rash secondary to treatment (+) rash/bumps on chest secondary to liver cirrhosis Lymphatics: Denies new lymphadenopathy or easy bruising Neurological:Denies numbness, tingling or new weaknesses MSK: (+) back pain  Behavioral/Psych: Mood is stable, no new changes  All other systems were reviewed with the patient and are negative.  PHYSICAL EXAMINATION: ECOG PERFORMANCE STATUS: 1  Vitals:   04/25/17 1157  BP: (!) 139/59  Pulse: 80  Resp: 20  Temp: 98.3 F (36.8 C)   Filed Weights   04/25/17 1157  Weight: 222 lb 11.2 oz (101 kg)    GENERAL:alert, no distress and comfortable SKIN: skin color, texture, turgor are normal, no rashes or significant lesions EYES: normal, conjunctiva are pink and non-injected, sclera clear OROPHARYNX:no exudate, no erythema and lips, buccal mucosa, and tongue normal  NECK: supple, thyroid normal size, non-tender, without nodularity LYMPH:  no palpable lymphadenopathy in the cervical, axillary or inguinal LUNGS: clear to auscultation and percussion with normal breathing effort HEART: regular rate & rhythm and no murmurs and  no lower extremity edema ABDOMEN:abdomen soft, non-tender and normal bowel sounds Musculoskeletal:no cyanosis of digits and no clubbing  PSYCH: alert & oriented x 3 with fluent speech NEURO: no focal motor/sensory deficits  LABORATORY DATA:  I have reviewed the data as listed CBC Latest Ref Rng & Units 04/25/2017 04/13/2017 02/24/2017  WBC 3.9 - 10.3 10e3/uL 2.8(L) 4.8 3.1(L)  Hemoglobin 11.6 - 15.9 g/dL 11.0(L) 11.8 11.2(L)  Hematocrit 34.8 - 46.6 % 33.2(L) 35.3 34.6(L)  Platelets 145 - 400 10e3/uL 72(L) 115(L) 106(L)    Recent Labs  05/31/16 1025  10/18/16 0902 11/03/16 0732 02/24/17 1106 04/13/17 0833 04/25/17 1048  NA 140  --  137 140 140 132* 135*  K 4.0  --  3.7 3.7 3.9 4.4 4.2  CL 106  --  105 105 106  --   --   CO2 26  --  25 24 26 22 23   GLUCOSE 211*  --  169* 161* 160* 166* 235*    BUN 16  --  13 14 10  11.8 11.0  CREATININE 0.80  < > 0.70 0.64 0.64 0.8 0.7  CALCIUM 8.5*  --  9.2 8.9 8.6 8.7 8.5  GFRNONAA >60  --  >60 >60  --   --   --   GFRAA >60  --  >60 >60  --   --   --   PROT 6.4*  --  6.9 6.9 6.4 7.4 7.0  ALBUMIN 3.6  --  3.6 3.4* 3.3* 2.5* 2.4*  AST 54*  --  50* 39 38* 61* 94*  ALT 60*  --  43 41 27 35 56*  ALKPHOS 142*  --  133* 138* 141* 211* 349*  BILITOT 1.0  --  0.7 0.7 0.9 1.76* 1.81*  < > = values in this interval not displayed.   AFP  11/21/15: 124.5 04/12/16: 100.5 02/24/17: 2302.8 04/13/17: 7799   PATHOLOGY REPORT  Diagnosis 12/01/2015 Liver, needle/core biopsy, central right mass HEPATOCELLULAR CARCINOMA, MODERATELY DIFFERENTIATED. Microscopic Comment The biopsy shows diffuse malignant lesions which composed of well and moderately differentiated neoplastic hepatocytes. The carcinoma stains focal positive for hepar-1, negative for ck7. Reticulin and cd 34 highlights the proliferation pattern. The morphology and immunostaining pattern support the above diagnosis. This case also reviewed by Dr. Saralyn Pilar and agree.   RADIOGRAPHIC STUDIES: I have personally reviewed the radiological images as listed and agreed with the findings in the report.  CT Chest WO Contrast 04/13/17 IMPRESSION: 1. Stable mild prevascular item borderline paraesophageal lymphadenopathy since 11/25/2015. 2. No evidence for pulmonary metastases. 3. Coronary artery atherosclerosis.  MRI abdomen with and without contrast 03/01/2017 IMPRESSION: 1. Significant enlargement of the central hepatic mass, with thrombus and tumor thrombus extending in the intrahepatic portal veins especially centrally, and with continued involvement of the middle hepatic vein. Tumor volume is estimated to have increased approximately 3.5 times since the prior scan. 2. Stable mild porta hepatis adenopathy. 3. Splenomegaly is mildly worsened. 4. Trace ascites especially along the liver margin. 5.  Mild cardiomegaly.  MRI abdomen with and without contrast 10/05/2016 IMPRESSION: 1. Mild interval increase in size of central RIGHT hepatic lobe mass. 2. Interval extension of the tumor mass into the middle hepatic vein consistent with tumor progression. 3. Splenomegaly.  Portal vein is patent.  No ascites.    ASSESSMENT & PLAN: 76 y.o. female with PMH of HTN, DM, arthritis, hypothyroidism, obesity, buthistory of liver disease or heavy drinking history, presented with epigastric pain, nausea, and weight loss. Initial abdominal  CT and MRI showed a 4.1 cm mass in the right lobe of liver  1. Hepatocellular carcinoma of rght lobe, stage I  -I previously reviewed the CT and MRI findings with pt in details. The liver lesion was very concerning for malignancy, especially hepatocellular carcinoma. Although she does not have history of liver disease, image also showed portal hypertension and a mild liver cirrhosis, which is her high risk factor for [place].  -I previously reviewed her liver mass biopsy result, which confirmed [place].  -CT chest on 11/25/15 was negative for metastasis. This was previously discussed with patient -She is status post TACE by interventional radiology Dr. Annamaria Boots on 01/19/16, posttreatment MRI on 02/13/16 showed good partial response.  -MRI of the abd on 05/18/16 was stable. -The patient had repeat visceral angiograms with peripheral right hepatic DEB- TACE todominant right hepatic vasculature supplying the known hepatocellular carcinoma segment 8 on 05/31/16 by Dr. Annamaria Boots. -MRI of the abd on 10/05/16 showed mild interval increase of size of the central right hepatic lobe mass and interval extension of the tumor mass into the middle hepatic vein consistent with tumor progression. -The patient underwent Y-90 radioembolization to the LEFT hepatic lobe by Dr. Vernard Gambles on 11/03/16. -MRI of the abd on 03/01/17 showed significant enlargement of the central hepatic mass with thrombus and tumor  thrombus extending in the intrahepatic portal veins. Tumor volume is estimated to have increased approximately 3.5 times since the prior scan. Stable mild porta hepatis adenopathy. Splenomegaly is mildly worsened. Dr. Annamaria Boots does not think she is a candidate for more liver targeted therapy  -The patient's AFP has significantly increased from 100.5 in June 2017 to 2302.8 in April 2018. --We discussed her Tumor marker levels have been elevated, we will monitor to see if it comes down after Sorafenib, will repeat every 4 weeks.  -If tumor marker lowers then we will do a repeat scan the following month. If marker is increasing we will repeat scan sooner.  -If Sorafenib is not strong enough we will use IV chemo therapy.  -Labs reviewed and she is adequate to continue Sorafenib, I recommend her to try 400 mg twice daily.    2. Liver steatosis and early liver cirrhosis  -Her liver MRI on 02/13/16 showed mild hepatomegaly, steatosis and probable early cirrhosis -Her liver function is normal, no history of liver disease. -She will continue follow-up with Dr. Benson Norway -Rash on chest to upper neck secondary to liver cirrhosis has occurred. Will continue to monitor.    3. HTN, DM, hypothyroidism -She'll continue follow-up with her primary care physician -The patient is now off Metformin and is taking insulin. -Her sugar was elevated on 04/25/17 so I encouraged her to watch her sugar intake  4. Skin rash  -Secondary to Sorafenib on her face and arms.  -We discussed skin rashes being a side effect of the treatment. If this becomes intolerable she will let me know. I encouraged her to stay moisturized and if her skin gets inflamed I suggest she use Cortizone cream once a day as well as cover her skin when in the sun. -Will continue to monitor.   5. [place] stomach pain.  -Casused my stomach distention pressing on liver.  -I encouraged her to eat smaller portions.  -For her pain I suggest she uses Zantac  and pain medication aleve since she cannot tolerate tylenol/tramdaol   PLAN -Lab and follow up in 3 weeks. -increase Sorafenib dose to 400 mg twice daily now if she can tolerate -  If her AFT continue increasing on next visit, will obtain restaging scan next month, and possible switch to Nivolumab    All questions were answered. The patient knows to call the clinic with any problems, questions or concerns.  I spent 25 minutes counseling the patient face to face. The total time spent in the appointment was 30 minutes and more than 50% was on counseling.     Truitt Merle, MD 04/25/2017   This document serves as a record of services personally performed by Truitt Merle, MD. It was created on her behalf by Joslyn Devon, a trained medical scribe. The creation of this record is based on the scribe's personal observations and the provider's statements to them. This document has been checked and approved by the attending provider.

## 2017-04-22 ENCOUNTER — Encounter: Payer: Self-pay | Admitting: Interventional Radiology

## 2017-04-25 ENCOUNTER — Telehealth: Payer: Self-pay | Admitting: Hematology

## 2017-04-25 ENCOUNTER — Other Ambulatory Visit (HOSPITAL_BASED_OUTPATIENT_CLINIC_OR_DEPARTMENT_OTHER): Payer: Medicare Other

## 2017-04-25 ENCOUNTER — Encounter: Payer: Self-pay | Admitting: Hematology

## 2017-04-25 ENCOUNTER — Ambulatory Visit (HOSPITAL_BASED_OUTPATIENT_CLINIC_OR_DEPARTMENT_OTHER): Payer: Medicare Other | Admitting: Hematology

## 2017-04-25 VITALS — BP 139/59 | HR 80 | Temp 98.3°F | Resp 20 | Ht 64.25 in | Wt 222.7 lb

## 2017-04-25 DIAGNOSIS — M8580 Other specified disorders of bone density and structure, unspecified site: Secondary | ICD-10-CM | POA: Diagnosis not present

## 2017-04-25 DIAGNOSIS — E119 Type 2 diabetes mellitus without complications: Secondary | ICD-10-CM

## 2017-04-25 DIAGNOSIS — M79643 Pain in unspecified hand: Secondary | ICD-10-CM | POA: Diagnosis not present

## 2017-04-25 DIAGNOSIS — R160 Hepatomegaly, not elsewhere classified: Secondary | ICD-10-CM | POA: Diagnosis not present

## 2017-04-25 DIAGNOSIS — C220 Liver cell carcinoma: Secondary | ICD-10-CM

## 2017-04-25 DIAGNOSIS — E039 Hypothyroidism, unspecified: Secondary | ICD-10-CM

## 2017-04-25 DIAGNOSIS — R1013 Epigastric pain: Secondary | ICD-10-CM | POA: Diagnosis not present

## 2017-04-25 DIAGNOSIS — R21 Rash and other nonspecific skin eruption: Secondary | ICD-10-CM | POA: Diagnosis not present

## 2017-04-25 DIAGNOSIS — K760 Fatty (change of) liver, not elsewhere classified: Secondary | ICD-10-CM | POA: Diagnosis not present

## 2017-04-25 DIAGNOSIS — I10 Essential (primary) hypertension: Secondary | ICD-10-CM | POA: Diagnosis not present

## 2017-04-25 DIAGNOSIS — M064 Inflammatory polyarthropathy: Secondary | ICD-10-CM | POA: Diagnosis not present

## 2017-04-25 DIAGNOSIS — M1990 Unspecified osteoarthritis, unspecified site: Secondary | ICD-10-CM | POA: Diagnosis not present

## 2017-04-25 LAB — COMPREHENSIVE METABOLIC PANEL
ALT: 56 U/L — ABNORMAL HIGH (ref 0–55)
ANION GAP: 8 meq/L (ref 3–11)
AST: 94 U/L — ABNORMAL HIGH (ref 5–34)
Albumin: 2.4 g/dL — ABNORMAL LOW (ref 3.5–5.0)
Alkaline Phosphatase: 349 U/L — ABNORMAL HIGH (ref 40–150)
BUN: 11 mg/dL (ref 7.0–26.0)
CALCIUM: 8.5 mg/dL (ref 8.4–10.4)
CO2: 23 mEq/L (ref 22–29)
CREATININE: 0.7 mg/dL (ref 0.6–1.1)
Chloride: 103 mEq/L (ref 98–109)
EGFR: 81 mL/min/{1.73_m2} — AB (ref >90)
Glucose: 235 mg/dl — ABNORMAL HIGH (ref 70–140)
Potassium: 4.2 mEq/L (ref 3.5–5.1)
Sodium: 135 mEq/L — ABNORMAL LOW (ref 136–145)
Total Bilirubin: 1.81 mg/dL — ABNORMAL HIGH (ref 0.20–1.20)
Total Protein: 7 g/dL (ref 6.4–8.3)

## 2017-04-25 LAB — CBC WITH DIFFERENTIAL/PLATELET
BASO%: 1 % (ref 0.0–2.0)
BASOS ABS: 0 10*3/uL (ref 0.0–0.1)
EOS ABS: 0.2 10*3/uL (ref 0.0–0.5)
EOS%: 6.4 % (ref 0.0–7.0)
HEMATOCRIT: 33.2 % — AB (ref 34.8–46.6)
HGB: 11 g/dL — ABNORMAL LOW (ref 11.6–15.9)
LYMPH#: 0.4 10*3/uL — AB (ref 0.9–3.3)
LYMPH%: 15 % (ref 14.0–49.7)
MCH: 27.9 pg (ref 25.1–34.0)
MCHC: 33.2 g/dL (ref 31.5–36.0)
MCV: 84.2 fL (ref 79.5–101.0)
MONO#: 0.5 10*3/uL (ref 0.1–0.9)
MONO%: 16 % — ABNORMAL HIGH (ref 0.0–14.0)
NEUT#: 1.7 10*3/uL (ref 1.5–6.5)
NEUT%: 61.6 % (ref 38.4–76.8)
PLATELETS: 72 10*3/uL — AB (ref 145–400)
RBC: 3.94 10*6/uL (ref 3.70–5.45)
RDW: 16.3 % — ABNORMAL HIGH (ref 11.2–14.5)
WBC: 2.8 10*3/uL — ABNORMAL LOW (ref 3.9–10.3)

## 2017-04-25 NOTE — Telephone Encounter (Signed)
Appointments scheduled per 04/25/17 los. °Patient was given a copy of the AVS report and appointment schedule, per 04/25/17 los. °

## 2017-04-26 DIAGNOSIS — M792 Neuralgia and neuritis, unspecified: Secondary | ICD-10-CM | POA: Diagnosis not present

## 2017-04-26 DIAGNOSIS — M25571 Pain in right ankle and joints of right foot: Secondary | ICD-10-CM | POA: Diagnosis not present

## 2017-04-26 DIAGNOSIS — M722 Plantar fascial fibromatosis: Secondary | ICD-10-CM | POA: Diagnosis not present

## 2017-04-26 DIAGNOSIS — M25572 Pain in left ankle and joints of left foot: Secondary | ICD-10-CM | POA: Diagnosis not present

## 2017-05-03 DIAGNOSIS — M25571 Pain in right ankle and joints of right foot: Secondary | ICD-10-CM | POA: Diagnosis not present

## 2017-05-03 DIAGNOSIS — M722 Plantar fascial fibromatosis: Secondary | ICD-10-CM | POA: Diagnosis not present

## 2017-05-03 DIAGNOSIS — M25572 Pain in left ankle and joints of left foot: Secondary | ICD-10-CM | POA: Diagnosis not present

## 2017-05-03 DIAGNOSIS — M10079 Idiopathic gout, unspecified ankle and foot: Secondary | ICD-10-CM | POA: Diagnosis not present

## 2017-05-05 DIAGNOSIS — M542 Cervicalgia: Secondary | ICD-10-CM | POA: Diagnosis not present

## 2017-05-05 DIAGNOSIS — M7502 Adhesive capsulitis of left shoulder: Secondary | ICD-10-CM | POA: Diagnosis not present

## 2017-05-08 ENCOUNTER — Telehealth: Payer: Self-pay | Admitting: Hematology

## 2017-05-08 NOTE — Telephone Encounter (Signed)
Lvm advising appt 7/9 moved to 7/25 @ 1.30pm due to md on call.

## 2017-05-11 ENCOUNTER — Other Ambulatory Visit: Payer: Self-pay | Admitting: Hematology

## 2017-05-11 DIAGNOSIS — C220 Liver cell carcinoma: Secondary | ICD-10-CM

## 2017-05-12 DIAGNOSIS — M25512 Pain in left shoulder: Secondary | ICD-10-CM | POA: Diagnosis not present

## 2017-05-13 ENCOUNTER — Other Ambulatory Visit (HOSPITAL_BASED_OUTPATIENT_CLINIC_OR_DEPARTMENT_OTHER): Payer: Medicare Other

## 2017-05-13 ENCOUNTER — Telehealth: Payer: Self-pay | Admitting: *Deleted

## 2017-05-13 DIAGNOSIS — C220 Liver cell carcinoma: Secondary | ICD-10-CM | POA: Diagnosis not present

## 2017-05-13 LAB — CBC WITH DIFFERENTIAL/PLATELET
BASO%: 0.7 % (ref 0.0–2.0)
BASOS ABS: 0 10*3/uL (ref 0.0–0.1)
EOS%: 0.5 % (ref 0.0–7.0)
Eosinophils Absolute: 0 10*3/uL (ref 0.0–0.5)
HEMATOCRIT: 36.3 % (ref 34.8–46.6)
HGB: 11.7 g/dL (ref 11.6–15.9)
LYMPH#: 0.5 10*3/uL — AB (ref 0.9–3.3)
LYMPH%: 12.3 % — ABNORMAL LOW (ref 14.0–49.7)
MCH: 28.4 pg (ref 25.1–34.0)
MCHC: 32.2 g/dL (ref 31.5–36.0)
MCV: 88.1 fL (ref 79.5–101.0)
MONO#: 0.5 10*3/uL (ref 0.1–0.9)
MONO%: 11 % (ref 0.0–14.0)
NEUT#: 3.1 10*3/uL (ref 1.5–6.5)
NEUT%: 75.5 % (ref 38.4–76.8)
Platelets: 75 10*3/uL — ABNORMAL LOW (ref 145–400)
RBC: 4.12 10*6/uL (ref 3.70–5.45)
RDW: 17.9 % — ABNORMAL HIGH (ref 11.2–14.5)
WBC: 4.1 10*3/uL (ref 3.9–10.3)

## 2017-05-13 LAB — COMPREHENSIVE METABOLIC PANEL
ALT: 79 U/L — AB (ref 0–55)
ANION GAP: 9 meq/L (ref 3–11)
AST: 103 U/L — ABNORMAL HIGH (ref 5–34)
Albumin: 2.3 g/dL — ABNORMAL LOW (ref 3.5–5.0)
Alkaline Phosphatase: 409 U/L — ABNORMAL HIGH (ref 40–150)
BILIRUBIN TOTAL: 2.38 mg/dL — AB (ref 0.20–1.20)
BUN: 9.6 mg/dL (ref 7.0–26.0)
CALCIUM: 8.3 mg/dL — AB (ref 8.4–10.4)
CO2: 25 mEq/L (ref 22–29)
CREATININE: 0.7 mg/dL (ref 0.6–1.1)
Chloride: 105 mEq/L (ref 98–109)
EGFR: 86 mL/min/{1.73_m2} — ABNORMAL LOW (ref >90)
Glucose: 190 mg/dl — ABNORMAL HIGH (ref 70–140)
Potassium: 3.4 mEq/L — ABNORMAL LOW (ref 3.5–5.1)
Sodium: 139 mEq/L (ref 136–145)
TOTAL PROTEIN: 7.2 g/dL (ref 6.4–8.3)

## 2017-05-13 NOTE — Telephone Encounter (Signed)
Late Entry:  Pt called [facility] on 05/10/17 with c/o diarrhea since on nexavar every time she eats or drinks anything.  She had not tried imodium at the time.  Called pt on 05/12/17 & instructed to take imodium.  She states she can't tol taking 2 bid.  She took 2 04/11/17 am & 1 that pm.  Instructed to stay at that dose & Dr Burr Medico approved. Called pt & verified that she was doing better today.

## 2017-05-14 LAB — AFP TUMOR MARKER: AFP, Serum, Tumor Marker: 16347 ng/mL — ABNORMAL HIGH (ref 0.0–8.3)

## 2017-05-16 ENCOUNTER — Ambulatory Visit: Payer: Medicare Other | Admitting: Hematology

## 2017-05-16 ENCOUNTER — Telehealth: Payer: Self-pay | Admitting: Hematology

## 2017-05-16 ENCOUNTER — Encounter: Payer: Self-pay | Admitting: Hematology

## 2017-05-16 NOTE — Telephone Encounter (Signed)
lvm to inform pt of 7/10 appt at 1245 per sch msg

## 2017-05-17 ENCOUNTER — Ambulatory Visit: Payer: Medicare Other | Admitting: Hematology

## 2017-05-17 ENCOUNTER — Telehealth: Payer: Self-pay | Admitting: *Deleted

## 2017-05-17 ENCOUNTER — Other Ambulatory Visit: Payer: Self-pay | Admitting: Hematology

## 2017-05-17 DIAGNOSIS — R269 Unspecified abnormalities of gait and mobility: Secondary | ICD-10-CM | POA: Diagnosis not present

## 2017-05-17 DIAGNOSIS — M25571 Pain in right ankle and joints of right foot: Secondary | ICD-10-CM | POA: Diagnosis not present

## 2017-05-17 DIAGNOSIS — M25572 Pain in left ankle and joints of left foot: Secondary | ICD-10-CM | POA: Diagnosis not present

## 2017-05-17 DIAGNOSIS — L0291 Cutaneous abscess, unspecified: Secondary | ICD-10-CM | POA: Diagnosis not present

## 2017-05-17 DIAGNOSIS — C220 Liver cell carcinoma: Secondary | ICD-10-CM

## 2017-05-17 NOTE — Telephone Encounter (Signed)
Spoke with pt and gave pt new appts date and time for 06/01/17.  Informed pt that radiology scheduler will contact pt with appt for MRI to be done prior to office visit on 06/01/17.  Pt voiced understanding.

## 2017-05-23 ENCOUNTER — Telehealth: Payer: Self-pay | Admitting: *Deleted

## 2017-05-23 NOTE — Telephone Encounter (Signed)
Received refill request for Nexavar from [facility].   Spoke with pt and was informed that pt is still taking Nexavar  2 tabs in AM and 1 tab in PM.  Pt stated she has plenty of med left until next office visit.   Refill not done until after pt sees md on 06/01/17.

## 2017-05-27 DIAGNOSIS — L0291 Cutaneous abscess, unspecified: Secondary | ICD-10-CM | POA: Diagnosis not present

## 2017-05-27 DIAGNOSIS — R269 Unspecified abnormalities of gait and mobility: Secondary | ICD-10-CM | POA: Diagnosis not present

## 2017-05-27 DIAGNOSIS — M25572 Pain in left ankle and joints of left foot: Secondary | ICD-10-CM | POA: Diagnosis not present

## 2017-05-27 DIAGNOSIS — M25571 Pain in right ankle and joints of right foot: Secondary | ICD-10-CM | POA: Diagnosis not present

## 2017-05-30 ENCOUNTER — Ambulatory Visit (HOSPITAL_COMMUNITY)
Admission: RE | Admit: 2017-05-30 | Discharge: 2017-05-30 | Disposition: A | Discharge status: Home / Self Care | Payer: Medicare Other | Source: Ambulatory Visit | Attending: Hematology | Admitting: Hematology

## 2017-05-30 DIAGNOSIS — R188 Other ascites: Secondary | ICD-10-CM | POA: Diagnosis not present

## 2017-05-30 DIAGNOSIS — R590 Localized enlarged lymph nodes: Secondary | ICD-10-CM | POA: Insufficient documentation

## 2017-05-30 DIAGNOSIS — R161 Splenomegaly, not elsewhere classified: Secondary | ICD-10-CM | POA: Insufficient documentation

## 2017-05-30 DIAGNOSIS — C220 Liver cell carcinoma: Secondary | ICD-10-CM | POA: Insufficient documentation

## 2017-05-30 MED ORDER — GADOXETATE DISODIUM 0.25 MMOL/ML IV SOLN
10.0000 mL | Freq: Once | INTRAVENOUS | Status: AC | PRN
  Administered 2017-05-30: 10 mL via INTRAVENOUS

## 2017-05-31 DIAGNOSIS — R269 Unspecified abnormalities of gait and mobility: Secondary | ICD-10-CM | POA: Diagnosis not present

## 2017-05-31 DIAGNOSIS — M25572 Pain in left ankle and joints of left foot: Secondary | ICD-10-CM | POA: Diagnosis not present

## 2017-05-31 DIAGNOSIS — M25571 Pain in right ankle and joints of right foot: Secondary | ICD-10-CM | POA: Diagnosis not present

## 2017-06-01 ENCOUNTER — Ambulatory Visit: Payer: Medicare Other | Admitting: Hematology

## 2017-06-01 ENCOUNTER — Other Ambulatory Visit (HOSPITAL_BASED_OUTPATIENT_CLINIC_OR_DEPARTMENT_OTHER): Payer: Medicare Other

## 2017-06-01 ENCOUNTER — Encounter: Payer: Self-pay | Admitting: Hematology

## 2017-06-01 ENCOUNTER — Ambulatory Visit (HOSPITAL_BASED_OUTPATIENT_CLINIC_OR_DEPARTMENT_OTHER): Payer: Medicare Other | Admitting: Hematology

## 2017-06-01 ENCOUNTER — Telehealth: Payer: Self-pay | Admitting: Hematology

## 2017-06-01 VITALS — BP 145/69 | HR 70 | Temp 98.0°F | Resp 18 | Ht 64.25 in | Wt 209.6 lb

## 2017-06-01 DIAGNOSIS — I10 Essential (primary) hypertension: Secondary | ICD-10-CM | POA: Diagnosis not present

## 2017-06-01 DIAGNOSIS — L270 Generalized skin eruption due to drugs and medicaments taken internally: Secondary | ICD-10-CM | POA: Diagnosis not present

## 2017-06-01 DIAGNOSIS — D696 Thrombocytopenia, unspecified: Secondary | ICD-10-CM

## 2017-06-01 DIAGNOSIS — K7581 Nonalcoholic steatohepatitis (NASH): Secondary | ICD-10-CM

## 2017-06-01 DIAGNOSIS — Z7189 Other specified counseling: Secondary | ICD-10-CM

## 2017-06-01 DIAGNOSIS — G893 Neoplasm related pain (acute) (chronic): Secondary | ICD-10-CM | POA: Diagnosis not present

## 2017-06-01 DIAGNOSIS — K760 Fatty (change of) liver, not elsewhere classified: Secondary | ICD-10-CM

## 2017-06-01 DIAGNOSIS — K7460 Unspecified cirrhosis of liver: Secondary | ICD-10-CM | POA: Diagnosis not present

## 2017-06-01 DIAGNOSIS — C220 Liver cell carcinoma: Secondary | ICD-10-CM

## 2017-06-01 DIAGNOSIS — E119 Type 2 diabetes mellitus without complications: Secondary | ICD-10-CM

## 2017-06-01 DIAGNOSIS — E039 Hypothyroidism, unspecified: Secondary | ICD-10-CM | POA: Diagnosis not present

## 2017-06-01 LAB — COMPREHENSIVE METABOLIC PANEL
ALT: 82 U/L — ABNORMAL HIGH (ref 0–55)
AST: 154 U/L — AB (ref 5–34)
Albumin: 1.9 g/dL — ABNORMAL LOW (ref 3.5–5.0)
Alkaline Phosphatase: 462 U/L — ABNORMAL HIGH (ref 40–150)
Anion Gap: 8 mEq/L (ref 3–11)
BUN: 14.3 mg/dL (ref 7.0–26.0)
CO2: 22 meq/L (ref 22–29)
Calcium: 8.1 mg/dL — ABNORMAL LOW (ref 8.4–10.4)
Chloride: 107 mEq/L (ref 98–109)
Creatinine: 0.8 mg/dL (ref 0.6–1.1)
EGFR: 74 mL/min/{1.73_m2} — AB (ref >90)
GLUCOSE: 168 mg/dL — AB (ref 70–140)
POTASSIUM: 3.9 meq/L (ref 3.5–5.1)
SODIUM: 137 meq/L (ref 136–145)
Total Bilirubin: 3.83 mg/dL (ref 0.20–1.20)
Total Protein: 6.9 g/dL (ref 6.4–8.3)

## 2017-06-01 LAB — CBC WITH DIFFERENTIAL/PLATELET
BASO%: 1 % (ref 0.0–2.0)
BASOS ABS: 0 10*3/uL (ref 0.0–0.1)
EOS%: 0.5 % (ref 0.0–7.0)
Eosinophils Absolute: 0 10*3/uL (ref 0.0–0.5)
HCT: 37.4 % (ref 34.8–46.6)
HGB: 12 g/dL (ref 11.6–15.9)
LYMPH%: 12.4 % — ABNORMAL LOW (ref 14.0–49.7)
MCH: 28.1 pg (ref 25.1–34.0)
MCHC: 32.1 g/dL (ref 31.5–36.0)
MCV: 87.6 fL (ref 79.5–101.0)
MONO#: 0.6 10*3/uL (ref 0.1–0.9)
MONO%: 14.7 % — AB (ref 0.0–14.0)
NEUT#: 2.8 10*3/uL (ref 1.5–6.5)
NEUT%: 71.4 % (ref 38.4–76.8)
NRBC: 0 % (ref 0–0)
Platelets: 113 10*3/uL — ABNORMAL LOW (ref 145–400)
RBC: 4.27 10*6/uL (ref 3.70–5.45)
RDW: 19.1 % — AB (ref 11.2–14.5)
WBC: 4 10*3/uL (ref 3.9–10.3)
lymph#: 0.5 10*3/uL — ABNORMAL LOW (ref 0.9–3.3)

## 2017-06-01 MED ORDER — TRAMADOL HCL 50 MG PO TABS: 50.0000 mg | ORAL_TABLET | Freq: Three times a day (TID) | ORAL | 1 refills | Status: AC | PRN

## 2017-06-01 MED ORDER — LIDOCAINE-PRILOCAINE 2.5-2.5 % EX CREA: TOPICAL_CREAM | CUTANEOUS | 3 refills | Status: AC

## 2017-06-01 NOTE — Progress Notes (Signed)
Faxed script for Tramadol to [place].

## 2017-06-01 NOTE — Telephone Encounter (Signed)
Gave patient avs report and appointments for August. IR will contact patient re port once order entered.

## 2017-06-01 NOTE — Progress Notes (Signed)
[place]  [phone] [phone]  Clinic follow Up Note   Patient Care Team: Thressa Sheller, MD as PCP - General (Internal Medicine) Carol Ada, MD as Consulting Physician (Gastroenterology) Stark Klein, MD as Consulting Physician (General Surgery) Greggory Keen, MD as Consulting Physician (Interventional Radiology) 06/01/2017    CHIEF COMPLAINTS:  Follow up Megan Horton    Oncology History   Hepatocellular carcinoma [facility])   Staging form: Liver (Excluding Intrahepatic Bile Ducts), AJCC 7th Edition     Clinical stage from 12/01/2015: Stage I (T1, N0, M0) - Signed by Truitt Merle, MD on 04/12/2016       Hepatocellular carcinoma ([place])   10/27/2015 Imaging    Liver MRI with and without contrast showed a 4 cm lesion in the segment 8 liver, image characteristics  favor [place]. Mild hepatomegaly, no other metastasis.      11/21/2015 Initial Diagnosis    Hepatocellular carcinoma ([place])      12/01/2015 Initial Biopsy    Liver core needle biopsy showed hepatocellular carcinoma, moderately differentiated.      01/19/2016 Procedure    TACE to the right lobe liver cancer by Dr. Annamaria Boots       02/13/2016 Imaging    Abdominal MRI showed slightly smaller segment 8 liver lesion with reduced amount of central enhancement. Morphological findings of cirrhosis with background hepatic steatosis. Borderline prominent portal hepatis lymph node 1.3 cm, stable.      05/18/2016 Imaging    MRI abdomen with and without contrast 05/18/2016 IMPRESSION: No significant change in size of right hepatic lobe mass in segment 8. No new or progressive hepatic neoplasm identified. Stable mild porta hepatis lymphadenopathy measuring up to 1.3 cm. No new or progressive lymphadenopathy identified. Stable hepatic cirrhosis and findings of portal venous hypertension, including mild splenomegaly and upper abdominal portosystemic venous collaterals.      05/31/2016 Procedure    Repeat  visceral angiograms with peripheral right hepatic DEB- TACE to dominant right hepatic vasculature supplying the known hepatocellular carcinoma segment 8 on 05/31/16 by Dr. Annamaria Boots      10/05/2016 Imaging    MRI abdomen with and without contrast 10/05/2016 IMPRESSION: 1. Mild interval increase in size of central RIGHT hepatic lobe mass. 2. Interval extension of the tumor mass into the middle hepatic vein consistent with tumor progression. 3. Splenomegaly.  Portal vein is patent.  No ascites.      11/03/2016 Procedure    Y-90 radioembolization of the left hepatic lobe by Dr. Vernard Gambles on 11/03/16.      03/01/2017 Imaging    MRI abdomen with and without contrast 03/01/2017 IMPRESSION: 1. Significant enlargement of the central hepatic mass, with thrombus and tumor thrombus extending in the intrahepatic portal veins especially centrally, and with continued involvement of the middle hepatic vein. Tumor volume is estimated to have increased approximately 3.5 times since the prior scan. 2. Stable mild porta hepatis adenopathy. 3. Splenomegaly is mildly worsened. 4. Trace ascites especially along the liver margin. 5. Mild cardiomegaly.      03/28/2017 - 06/01/2017 Chemotherapy    Sorafenib start at 200mg  bid, and increase by 200mg /day every 2 weeks (full dose 400mg  bid) if she tolerates well. Started 03/28/17  Due to tolerance issues she will reduce Sorafenib dose to 2 in the morning and 1 in the evening 1 hour before meal starting 04/13/17  Increase to 400mg  twice daily starting 04/25/17  Stopped due to cancer progression       05/30/2017 Progression    MRI  results  IMPRESSION: 1. Marked progression of disease with significant enlargement of the previously noted central hepatic mass, and new multifocal disease throughout the liver, most pronounced throughout the left lobe which is now diffusely infiltrated. 2. Mildly enlarged hepatoduodenal ligament lymph node concerning for metastatic  disease. 3. Trace volume of ascites. 4. Splenomegaly. 5. Additional incidental findings, as above.       Chemotherapy    Nivolumab every 2 weeks        HISTORY OF PRESENTING ILLNESS (11/21/2015):  Talesha Ellithorpe 76 y.o. female is here because of recent abnormal scan finding of a liver lesion.   She started having intermittent epigastric pain,  nausea, vomiting and weight loss 1.5 months ago, and presented to the ER on 10/10/2015 at [facility] for worsening symptoms. A CT scan was performed and she was noted to have 4.5 cm intralobar hypodense lesion in right love of liver and splenomegaly at 14.5 cm. There was also noted dilation of her portal vein consistent with portal HTN. She was sent to [facility] for further evaluation.She underwent endoscopy by Dr. Benson Norway on 10/10/2015, which was unremarkable. Abdominal MRI was subsequently obtained on 10/27/2015, which showed a 4.0 x 4.1 cm lesion in segment 8 of right liver lobe, with imaging features hepatocellular carcinoma. 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  She still has epigastric pain after meal, good appetite, gained some weight back, no nausea, mild constipation. She denies hematochezia, change of bowel habits, or other symptoms. She lives alone and tolerates daily activities without much difficulty.  She denies prior history of liver disease, no significant alcohol drinking history, no blood transfusion. She had a screening colonoscopy on 05/23/2015 which showed 5 small polyps which were removed.  CURRENT THERAPY: Sorafenib start at 200mg  bid, and increase by 200mg /day every 2 weeks (full dose 400mg  bid) if she tolerates well. Started 03/28/17. Due to tolerance issues she will reduce Sorafenib dose to 2 in the morning and 1 in the evening 1 hour before meal starting 04/13/17, will try 400mg  bid again on 04/26/2017. Stopped 06/01/17 due to cancer progression Nivolumab.every 2 weeks starting in one week    INTERIM HISTORY:  Ms Digiulio returns for follow up. She presents to the clinic today with her son. She reports she has on going stomach pain when she eats. The pain does not subside. She did not get tramadol. She normally gets her medication through mail order. She has some nausea. She does get tired and have to take breaks throughout the day. She can still get around with her cane.  She has lost 12-13 pounds in the last week. Most likely due to pain.  She is able to tolerate Sorafenib.  He son requests FMLA paperwork signature.  Towards the evening her stomach will get bloated.    MEDICAL HISTORY:  Past Medical History:  Diagnosis Date  . Adhesive capsulitis of right shoulder   . Arthritis   . Complication of anesthesia 5/13   did have some disorientation post cerv fusion due to low sats  . Diabetes mellitus ([place])   . GERD (gastroesophageal reflux disease)   . Hyperlipemia   . Hypertension   . Hypothyroidism   . Left knee DJD   . Osteopenia   . Partial tear of rotator cuff(726.13) left  . Peripheral neuropathy    fingers   . Peripheral vascular disease ([place])   . Seasonal allergies   . Seasonal allergies   . Sleep apnea  CPAP, sleep study 3-4 years ago in [place], [place] Dr. Elbert Ewings   . Urgency of urination   . Wears glasses     SURGICAL HISTORY: Past Surgical History:  Procedure Laterality Date  . ABDOMINAL HYSTERECTOMY     complete  . ANTERIOR CERVICAL DECOMP/DISCECTOMY FUSION  03/10/2012   Procedure: ANTERIOR CERVICAL DECOMPRESSION/DISCECTOMY FUSION 1 LEVEL;  Surgeon: Eustace Moore, MD;  Location: [facility];  Service: Neurosurgery;  Laterality: N/A;  Cervical five-six anterior cervical decompression fusion with plating  . CARPAL TUNNEL RELEASE     bilat  . CERVICAL FUSION    . CHOLECYSTECTOMY  2009  . COLONOSCOPY W/ POLYPECTOMY    . COLONOSCOPY WITH PROPOFOL N/A 05/23/2015   Procedure: COLONOSCOPY WITH PROPOFOL;  Surgeon: Carol Ada, MD;  Location: [facility]  END[facility]COPY;  Service: Endoscopy;  Laterality: N/A;  . ESOPHAGOGASTRODUODENOSCOPY (EGD) WITH PROPOFOL N/A 10/10/2015   Procedure: ESOPHAGOGASTRODUODENOSCOPY (EGD) WITH PROPOFOL;  Surgeon: Carol Ada, MD;  Location: [facility];  Service: Endoscopy;  Laterality: N/A;  . EYE SURGERY  2012   bilat cataract  . HEEL SPUR EXCISION     bilat  . HEMORRHOID SURGERY  2009  . IR GENERIC HISTORICAL  05/31/2016   IR ANGIOGRAM SELECTIVE EACH ADDITIONAL VESSEL 05/31/2016 Greggory Keen, MD [facility]  . IR GENERIC HISTORICAL  05/31/2016   IR ANGIOGRAM VISCERAL SELECTIVE 05/31/2016 Greggory Keen, MD [facility]  . IR GENERIC HISTORICAL  05/31/2016   IR US GUIDE VASC ACCESS RIGHT 05/31/2016 Greggory Keen, MD [facility]  . IR GENERIC HISTORICAL  05/31/2016   IR ANGIOGRAM SELECTIVE EACH ADDITIONAL VESSEL 05/31/2016 Greggory Keen, MD [facility]  . IR GENERIC HISTORICAL  05/31/2016   IR EMBO TUMOR ORGAN ISCHEMIA INFARCT INC GUIDE ROADMAPPING 05/31/2016 Greggory Keen, MD [facility]  . IR GENERIC HISTORICAL  10/18/2016   IR ANGIOGRAM SELECTIVE EACH ADDITIONAL VESSEL 10/18/2016 Greggory Keen, MD [facility]  . IR GENERIC HISTORICAL  10/18/2016   IR ANGIOGRAM SELECTIVE EACH ADDITIONAL VESSEL 10/18/2016 Greggory Keen, MD [facility]  . IR GENERIC HISTORICAL  10/18/2016   IR US GUIDE VASC ACCESS RIGHT 10/18/2016 Greggory Keen, MD [facility]  . IR GENERIC HISTORICAL  10/18/2016   IR ANGIOGRAM VISCERAL SELECTIVE 10/18/2016 Greggory Keen, MD [facility]  . IR GENERIC HISTORICAL  10/18/2016   IR EMBO ARTERIAL NOT HEMORR HEMANG INC GUIDE ROADMAPPING 10/18/2016 Greggory Keen, MD [facility]  . IR GENERIC HISTORICAL  10/18/2016   IR ANGIOGRAM SELECTIVE EACH ADDITIONAL VESSEL 10/18/2016 Greggory Keen, MD [facility]  . IR GENERIC HISTORICAL  10/18/2016   IR ANGIOGRAM SELECTIVE EACH ADDITIONAL VESSEL 10/18/2016 Greggory Keen, MD [facility]  . IR GENERIC HISTORICAL  10/05/2016   IR RADIOLOGIST EVAL &  MGMT 10/05/2016 Greggory Keen, MD [facility]  . IR GENERIC HISTORICAL  11/03/2016   IR EMBO TUMOR ORGAN ISCHEMIA INFARCT INC GUIDE ROADMAPPING 11/03/2016 Arne Cleveland, MD [facility]  . IR GENERIC HISTORICAL  11/03/2016   IR ANGIOGRAM VISCERAL SELECTIVE 11/03/2016 Arne Cleveland, MD [facility]  . IR GENERIC HISTORICAL  11/03/2016   IR ANGIOGRAM SELECTIVE EACH ADDITIONAL VESSEL 11/03/2016 Arne Cleveland, MD [facility]  . IR GENERIC HISTORICAL  11/03/2016   IR US GUIDE VASC ACCESS RIGHT 11/03/2016 Arne Cleveland, MD [facility]  . IR GENERIC HISTORICAL  11/03/2016   IR ANGIOGRAM SELECTIVE EACH ADDITIONAL VESSEL 11/03/2016 Arne Cleveland, MD [facility]  . IR GENERIC HISTORICAL  11/03/2016   IR ANGIOGRAM SELECTIVE EACH ADDITIONAL VESSEL 11/03/2016 Arne Cleveland,  MD [facility]  . IR GENERIC HISTORICAL  11/03/2016   IR ANGIOGRAM SELECTIVE EACH ADDITIONAL VESSEL 11/03/2016 Arne Cleveland, MD [facility]  . IR GENERIC HISTORICAL  07/07/2016   IR RADIOLOGIST EVAL & MGMT 07/07/2016 Greggory Keen, MD [facility]  . IR GENERIC HISTORICAL  05/18/2016   IR RADIOLOGIST EVAL & MGMT 05/18/2016 [facility]  . IR GENERIC HISTORICAL  12/08/2016   IR RADIOLOGIST EVAL & MGMT 12/08/2016 Greggory Keen, MD [facility]  . IR RADIOLOGIST EVAL & MGMT  03/01/2017  . KNEE ARTHROSCOPY     Left x2  . NOSE SURGERY    . SHOULDER ARTHROSCOPY  06/27/2012   Procedure: ARTHROSCOPY SHOULDER;  Surgeon: Lorn Junes, MD;  Location: [facility];  Service: Orthopedics;  Laterality: Right;  right shoulder arthroscopy debridement extensive, distal clavulectomy, with resect adhesions with maniplulation  . TONSILLECTOMY    . TOTAL KNEE ARTHROPLASTY Left 07/02/2013   Procedure: TOTAL KNEE ARTHROPLASTY- left;  Surgeon: Lorn Junes, MD;  Location: [facility];  Service: Orthopedics;  Laterality: Left;  Marland Kitchen VARICOSE VEIN SURGERY     Right    SOCIAL HISTORY: Social History   Social  History  . Marital status: Widowed    Spouse name: N/A  . Number of children: N/A  . Years of education: N/A   Occupational History  . retired    Social History Main Topics  . Smoking status: Former Smoker    Packs/day: 0.50    Years: 35.00    Types: Cigarettes    Quit date: 12/26/1993  . Smokeless tobacco: Never Used  . Alcohol use Yes     Comment: very rarely wine, on holidays   . Drug use: No  . Sexual activity: Not on file   Other Topics Concern  . Not on file   Social History Narrative  . No narrative on file    FAMILY HISTORY: Family History  Problem Relation Age of Onset  . Heart disease Mother   . Hypertension Mother   . Stroke Mother   . Heart disease Father   . Heart attack Father   . Hypertension Father   . Heart disease Brother   . Heart attack Brother   . Heart attack Sister   . Hypertension Sister   . Stroke Brother     ALLERGIES:  is allergic to asa [aspirin]; adhesive [tape]; and tylenol [acetaminophen].  MEDICATIONS:  Current Outpatient Prescriptions  Medication Sig Dispense Refill  . alendronate (FOSAMAX) 70 MG tablet Take 70 mg by mouth once a week.     Marland Kitchen amLODipine (NORVASC) 5 MG tablet Take 5 mg by mouth every morning.     . B-D UF III MINI PEN NEEDLES 31G X 5 MM MISC     . BD PEN NEEDLE NANO U/F 32G X 4 MM MISC     . calcium-vitamin D (OSCAL WITH D) 250-125 MG-UNIT tablet Take 1 tablet by mouth daily.    Marland Kitchen COLCRYS 0.6 MG tablet Take 0.6 mg by mouth daily.     . cycloSPORINE (RESTASIS) 0.05 % ophthalmic emulsion Place 1 drop into both eyes 2 (two) times daily.    Marland Kitchen dexlansoprazole (DEXILANT) 60 MG capsule Take 60 mg by mouth every evening.     . docusate sodium (COLACE) 100 MG capsule Take 1 capsule (100 mg total) by mouth daily as needed for mild constipation. 10 capsule 0  . DULoxetine (CYMBALTA) 60 MG capsule Take 60 mg by mouth  every evening.     Marland Kitchen esomeprazole (NEXIUM) 40 MG capsule Take 40 mg by mouth every morning.     .  fluticasone (FLONASE) 50 MCG/ACT nasal spray Place 1 spray into both nostrils 2 (two) times daily as needed for allergies or rhinitis.     Marland Kitchen FREESTYLE LITE test strip     . furosemide (LASIX) 20 MG tablet Take 20 mg by mouth daily. Reported on 02/10/2016    . gabapentin (NEURONTIN) 300 MG capsule Take 300 mg by mouth at bedtime.     . hydrochlorothiazide (MICROZIDE) 12.5 MG capsule Take 12.5 mg by mouth daily.     . insulin aspart protamine- aspart (NOVOLOG MIX 70/30) (70-30) 100 UNIT/ML injection Inject 8 Units into the skin daily with breakfast.    . insulin aspart protamine- aspart (NOVOLOG MIX 70/30) (70-30) 100 UNIT/ML injection Inject 10 Units into the skin daily with supper.    . levothyroxine (SYNTHROID, LEVOTHROID) 112 MCG tablet Take 112 mcg by mouth daily.    Marland Kitchen loperamide (IMODIUM A-D) 2 MG tablet Take 2 mg by mouth at bedtime.    . lovastatin (MEVACOR) 40 MG tablet Take 40 mg by mouth at bedtime.    . Multiple Vitamins-Minerals (CENTRUM SILVER PO) Take 1 tablet by mouth every morning.     . Olopatadine HCl (PATANASE) 0.6 % SOLN Place 1 each into the nose daily as needed (for allergies).    . Omega-3 Fatty Acids (FISH OIL PO) Take 1 tablet by mouth daily.    . ranitidine (ZANTAC) 150 MG tablet Take 150 mg by mouth at bedtime. Reported on 11/21/2015    . SORAfenib (NEXAVAR) 200 MG tablet Take 2 tab in morning and 1 in evening. Give on an empty stomach 1 hour before or 2 hours after meals. 90 tablet 0  . telmisartan (MICARDIS) 80 MG tablet Take 80 mg by mouth every morning.     Marland Kitchen tetrahydrozoline (VISINE) 0.05 % ophthalmic solution Place 1 drop into both eyes 2 (two) times daily as needed (for allergies).     Marland Kitchen lidocaine-prilocaine (EMLA) cream Apply to affected area once 30 g 3  . traMADol (ULTRAM) 50 MG tablet Take 1 tablet (50 mg total) by mouth every 8 (eight) hours as needed. 30 tablet 1   No current facility-administered medications for this visit.     REVIEW OF SYSTEMS:     Constitutional: Denies fevers, chills or abnormal night sweats (+) low appetite (+) fatigue (+) weight loss Eyes: Denies blurriness of vision, double vision or watery eyes Ears, nose, mouth, throat, and face: Denies mucositis or sore throat Respiratory: Denies cough, dyspnea or wheezes Cardiovascular: Denies palpitation, chest discomfort (+) lower extremity swelling Gastrointestinal:  Denies nausea, heartburn or change in bowel habits (+) URQ and ULQ pain (+) stomach bloating  Skin: (+) face and arm rash secondary to treatment (+) rash/bumps on chest secondary to liver cirrhosis Lymphatics: Denies new lymphadenopathy or easy bruising Neurological:Denies numbness, tingling or new weaknesses MSK: (+) back pain  Behavioral/Psych: Mood is stable, no new changes  All other systems were reviewed with the patient and are negative.  PHYSICAL EXAMINATION: ECOG PERFORMANCE STATUS: 1  Vitals:   06/01/17 1332  BP: (!) 145/69  Pulse: 70  Resp: 18  Temp: 98 F (36.7 C)   Filed Weights   06/01/17 1332  Weight: 209 lb 9.6 oz (95.1 kg)    GENERAL:alert, no distress and comfortable SKIN: skin color, texture, turgor are normal, no rashes or significant  lesions EYES: normal, conjunctiva are pink and non-injected, sclera clear OROPHARYNX:no exudate, no erythema and lips, buccal mucosa, and tongue normal  NECK: supple, thyroid normal size, non-tender, without nodularity LYMPH:  no palpable lymphadenopathy in the cervical, axillary or inguinal LUNGS: clear to auscultation and percussion with normal breathing effort HEART: regular rate & rhythm and no murmurs and no lower extremity edema ABDOMEN:abdomen soft, non-tender and normal bowel sounds (+) tenderness in epigastric area, no palpable mass Musculoskeletal:no cyanosis of digits and no clubbing  PSYCH: alert & oriented x 3 with fluent speech NEURO: no focal motor/sensory deficits  LABORATORY DATA:  I have reviewed the data as listed CBC  Latest Ref Rng & Units 06/01/2017 05/13/2017 04/25/2017  WBC 3.9 - 10.3 10e3/uL 4.0 4.1 2.8(L)  Hemoglobin 11.6 - 15.9 g/dL 12.0 11.7 11.0(L)  Hematocrit 34.8 - 46.6 % 37.4 36.3 33.2(L)  Platelets 145 - 400 10e3/uL 113(L) 75(L) 72(L)    Recent Labs  10/18/16 0902 11/03/16 0732 02/24/17 1106  04/25/17 1048 05/13/17 0830 06/01/17 1307  NA 137 140 140  < > 135* 139 137  K 3.7 3.7 3.9  < > 4.2 3.4* 3.9  CL 105 105 106  --   --   --   --   CO2 25 24 26   < > 23 25 22   GLUCOSE 169* 161* 160*  < > 235* 190* 168*  BUN 13 14 10   < > 11.0 9.6 14.3  CREATININE 0.70 0.64 0.64  < > 0.7 0.7 0.8  CALCIUM 9.2 8.9 8.6  < > 8.5 8.3* 8.1*  GFRNONAA >60 >60  --   --   --   --   --   GFRAA >60 >60  --   --   --   --   --   PROT 6.9 6.9 6.4  < > 7.0 7.2 6.9  ALBUMIN 3.6 3.4* 3.3*  < > 2.4* 2.3* 1.9*  AST 50* 39 38*  < > 94* 103* 154*  ALT 43 41 27  < > 56* 79* 82*  ALKPHOS 133* 138* 141*  < > 349* 409* 462*  BILITOT 0.7 0.7 0.9  < > 1.81* 2.38* 3.83*  < > = values in this interval not displayed.   AFP  11/21/15: 124.5 04/12/16: 100.5 02/24/17: 2302.8 04/13/17: 7799 05/13/17: 16,347   PATHOLOGY REPORT  Diagnosis 12/01/2015 Liver, needle/core biopsy, central right mass HEPATOCELLULAR CARCINOMA, MODERATELY DIFFERENTIATED. Microscopic Comment The biopsy shows diffuse malignant lesions which composed of well and moderately differentiated neoplastic hepatocytes. The carcinoma stains focal positive for hepar-1, negative for ck7. Reticulin and cd 34 highlights the proliferation pattern. The morphology and immunostaining pattern support the above diagnosis. This case also reviewed by Dr. Saralyn Pilar and agree.   RADIOGRAPHIC STUDIES: I have personally reviewed the radiological images as listed and agreed with the findings in the report.  MRI Abdomen w wow Contrast 05/30/17 IMPRESSION: 1. Marked progression of disease with significant enlargement of the previously noted central hepatic mass, and new  multifocal disease throughout the liver, most pronounced throughout the left lobe which is now diffusely infiltrated. 2. Mildly enlarged hepatoduodenal ligament lymph node concerning for metastatic disease. 3. Trace volume of ascites. 4. Splenomegaly. 5. Additional incidental findings, as above.   CT Chest WO Contrast 04/13/17 IMPRESSION: 1. Stable mild prevascular item borderline paraesophageal lymphadenopathy since 11/25/2015. 2. No evidence for pulmonary metastases. 3. Coronary artery atherosclerosis.  MRI abdomen with and without contrast 03/01/2017 IMPRESSION: 1. Significant enlargement of the central hepatic  mass, with thrombus and tumor thrombus extending in the intrahepatic portal veins especially centrally, and with continued involvement of the middle hepatic vein. Tumor volume is estimated to have increased approximately 3.5 times since the prior scan. 2. Stable mild porta hepatis adenopathy. 3. Splenomegaly is mildly worsened. 4. Trace ascites especially along the liver margin. 5. Mild cardiomegaly.  MRI abdomen with and without contrast 10/05/2016 IMPRESSION: 1. Mild interval increase in size of central RIGHT hepatic lobe mass. 2. Interval extension of the tumor mass into the middle hepatic vein consistent with tumor progression. 3. Splenomegaly.  Portal vein is patent.  No ascites.    ASSESSMENT & PLAN: 76 y.o. female with PMH of HTN, DM, arthritis, hypothyroidism, obesity, buthistory of liver disease or heavy drinking history, presented with epigastric pain, nausea, and weight loss. Initial abdominal CT and MRI showed a 4.1 cm mass in the right lobe of liver  1. Hepatocellular carcinoma of rght lobe, stage I -I previously reviewed the CT and MRI findings with pt in details. The liver lesion was very concerning for malignancy, especially hepatocellular carcinoma. Although she does not have history of liver disease, image also showed portal hypertension and a mild  liver cirrhosis, which is her high risk factor for [place].  -I previously reviewed her liver mass biopsy result, which confirmed [place].  -CT chest on 11/25/15 was negative for metastasis. This was previously discussed with patient -She is status post TACE by interventional radiology Dr. Annamaria Boots on 01/19/16, posttreatment MRI on 02/13/16 showed good partial response.  -MRI of the abd on 05/18/16 was stable. -The patient had repeat visceral angiograms with peripheral right hepatic DEB- TACE todominant right hepatic vasculature supplying the known hepatocellular carcinoma segment 8 on 05/31/16 by Dr. Annamaria Boots. -MRI of the abd on 10/05/16 showed mild interval increase of size of the central right hepatic lobe mass and interval extension of the tumor mass into the middle hepatic vein consistent with tumor progression. -The patient underwent Y-90 radioembolization to the LEFT hepatic lobe by Dr. Vernard Gambles on 11/03/16. -MRI of the abd on 03/01/17 showed significant enlargement of the central hepatic mass with thrombus and tumor thrombus extending in the intrahepatic portal veins. Tumor volume is estimated to have increased approximately 3.5 times since the prior scan. Stable mild porta hepatis adenopathy. Splenomegaly is mildly worsened. Dr. Annamaria Boots does not think she is a candidate for more liver targeted therapy  -The patient's AFP has significantly increased from 100.5 in June 2017 to 2302.8 in April 2018. --she has started first line systemic therapy with sorafenib, tolerated not very well and had dose reduction -We reviewed her MRI from 05/30/17 and it shows significant cancer progression in liver with new lesions. Will stop sorafenib and I will change her to IV nivolumab every 2 weeks, which is FDA approved for HCC, the response rate is in 20-30% -Potential side effects from Nivolumab, which includes but not limited to, fatigue, skin rash, hypothyroidism, hyperthyroidism, pneumonitis, other endocrine disorders, etc. were  discussed with patient in details. She agrees to proceed -We discussed the possibility of a port placement. She agrees to port.  -Labs reviewed and her Bilirubin is 3.8. Her kidney functions and CBC is normal. She is still adequate to do new treatment.  -I discussed why she would not be a good candidate for the a liver transplant.  -We will start nivolumab next in a week after port placement -f/u in 3 weeks   2. Liver steatosis and early liver cirrhosis  -Her liver MRI  on 02/13/16 showed mild hepatomegaly, steatosis and probable early cirrhosis -Her liver function is normal, no history of liver disease. -She will continue follow-up with Dr. Benson Norway -Rash on chest to upper neck secondary to liver cirrhosis has occurred. Will continue to monitor.    3. HTN, DM, hypothyroidism -She'll continue follow-up with her primary care physician -The patient is now off Metformin and is taking insulin. -Her sugar was elevated on 04/25/17 so I encouraged her to watch her sugar intake  4. Skin rash  -Secondary to Sorafenib on her face and arms.  -We discussed skin rashes being a side effect of the treatment. If this becomes intolerable she will let me know. I encouraged her to stay moisturized and if her skin gets inflamed I suggest she use Cortizone cream once a day as well as cover her skin when in the sun. -Will continue to monitor.   5. Upper Quadrant stomach pain.  -Secondary to his liver cancer -I encouraged her to eat smaller portions.  -For her pain I suggest she uses Zantac and pain medication aleve since she cannot tolerate tylenol/tramdaol.  -Pain is now ongoing whenever she eats. She has lost 12-13 pounds due to this. I give her order tramadol to take up to 3 times a day.   6. Goal of care discussion  -We again discussed the incurable nature of her cancer, and the overall poor prognosis, especially if she does not have good response to chemotherapy or progress on chemo -The patient understands  the goal of care is palliative. -I recommend DNR/DNI, she will think about it     PLAN -Order tramadol to use up to 3 times a day  -Lab, flush, and nivolumab in 1 and 3 weeks -f/u in 3 weeks -Port placement by IR -sign FMLA for her son    All questions were answered. The patient knows to call the clinic with any problems, questions or concerns.  I spent 30 minutes counseling the patient face to face. The total time spent in the appointment was 40 minutes and more than 50% was on counseling.     Truitt Merle, MD 06/01/2017   This document serves as a record of services personally performed by Truitt Merle, MD. It was created on her behalf by Joslyn Devon, a trained medical scribe. The creation of this record is based on the scribe's personal observations and the provider's statements to them. This document has been checked and approved by the attending provider.

## 2017-06-01 NOTE — Progress Notes (Signed)
START OFF PATHWAY REGIMEN - Other Dx   [id number]:Nivolumab 240 mg q14 Days:   A cycle is every 14 days:     Nivolumab   **Always confirm dose/schedule in your pharmacy ordering system**  Patient Characteristics: Intent of Therapy: Non-Curative / Palliative Intent, Discussed with Patient 

## 2017-06-03 DIAGNOSIS — M25571 Pain in right ankle and joints of right foot: Secondary | ICD-10-CM | POA: Diagnosis not present

## 2017-06-03 DIAGNOSIS — R269 Unspecified abnormalities of gait and mobility: Secondary | ICD-10-CM | POA: Diagnosis not present

## 2017-06-03 DIAGNOSIS — M25572 Pain in left ankle and joints of left foot: Secondary | ICD-10-CM | POA: Diagnosis not present

## 2017-06-07 ENCOUNTER — Ambulatory Visit: Payer: Medicare Other | Admitting: Pulmonary Disease

## 2017-06-08 DIAGNOSIS — M25571 Pain in right ankle and joints of right foot: Secondary | ICD-10-CM | POA: Diagnosis not present

## 2017-06-08 DIAGNOSIS — M25572 Pain in left ankle and joints of left foot: Secondary | ICD-10-CM | POA: Diagnosis not present

## 2017-06-09 ENCOUNTER — Ambulatory Visit: Payer: Medicare Other

## 2017-06-09 ENCOUNTER — Ambulatory Visit (HOSPITAL_BASED_OUTPATIENT_CLINIC_OR_DEPARTMENT_OTHER): Payer: Medicare Other

## 2017-06-09 ENCOUNTER — Other Ambulatory Visit (HOSPITAL_BASED_OUTPATIENT_CLINIC_OR_DEPARTMENT_OTHER): Payer: Medicare Other

## 2017-06-09 VITALS — BP 110/79 | HR 85 | Temp 97.6°F | Resp 17

## 2017-06-09 DIAGNOSIS — C220 Liver cell carcinoma: Secondary | ICD-10-CM

## 2017-06-09 DIAGNOSIS — Z5112 Encounter for antineoplastic immunotherapy: Secondary | ICD-10-CM

## 2017-06-09 DIAGNOSIS — Z95828 Presence of other vascular implants and grafts: Secondary | ICD-10-CM

## 2017-06-09 LAB — CBC WITH DIFFERENTIAL/PLATELET
BASO%: 0.3 % (ref 0.0–2.0)
Basophils Absolute: 0 10*3/uL (ref 0.0–0.1)
EOS%: 0.4 % (ref 0.0–7.0)
Eosinophils Absolute: 0 10*3/uL (ref 0.0–0.5)
HCT: 37.6 % (ref 34.8–46.6)
HGB: 12.4 g/dL (ref 11.6–15.9)
LYMPH%: 8.3 % — AB (ref 14.0–49.7)
MCH: 28.4 pg (ref 25.1–34.0)
MCHC: 33.1 g/dL (ref 31.5–36.0)
MCV: 85.9 fL (ref 79.5–101.0)
MONO#: 0.7 10*3/uL (ref 0.1–0.9)
MONO%: 10.4 % (ref 0.0–14.0)
NEUT#: 5.7 10*3/uL (ref 1.5–6.5)
NEUT%: 80.6 % — AB (ref 38.4–76.8)
PLATELETS: 112 10*3/uL — AB (ref 145–400)
RBC: 4.37 10*6/uL (ref 3.70–5.45)
RDW: 20.3 % — ABNORMAL HIGH (ref 11.2–14.5)
WBC: 7.1 10*3/uL (ref 3.9–10.3)
lymph#: 0.6 10*3/uL — ABNORMAL LOW (ref 0.9–3.3)

## 2017-06-09 LAB — COMPREHENSIVE METABOLIC PANEL
ALT: 99 U/L — AB (ref 0–55)
ANION GAP: 8 meq/L (ref 3–11)
AST: 213 U/L — AB (ref 5–34)
Albumin: 1.9 g/dL — ABNORMAL LOW (ref 3.5–5.0)
Alkaline Phosphatase: 638 U/L — ABNORMAL HIGH (ref 40–150)
BUN: 28.6 mg/dL — ABNORMAL HIGH (ref 7.0–26.0)
CHLORIDE: 102 meq/L (ref 98–109)
CO2: 24 meq/L (ref 22–29)
CREATININE: 0.8 mg/dL (ref 0.6–1.1)
Calcium: 8.6 mg/dL (ref 8.4–10.4)
EGFR: 77 mL/min/{1.73_m2} — ABNORMAL LOW (ref >90)
Glucose: 186 mg/dl — ABNORMAL HIGH (ref 70–140)
POTASSIUM: 3.8 meq/L (ref 3.5–5.1)
Sodium: 133 mEq/L — ABNORMAL LOW (ref 136–145)
Total Bilirubin: 6.46 mg/dL (ref 0.20–1.20)
Total Protein: 6.9 g/dL (ref 6.4–8.3)

## 2017-06-09 LAB — TSH: TSH: 3.084 m(IU)/L (ref 0.308–3.960)

## 2017-06-09 MED ORDER — SODIUM CHLORIDE 0.9 % IV SOLN
240.0000 mg | Freq: Once | INTRAVENOUS | Status: AC
  Administered 2017-06-09: 240 mg via INTRAVENOUS
  Filled 2017-06-09: qty 24

## 2017-06-09 MED ORDER — SODIUM CHLORIDE 0.9% FLUSH
10.0000 mL | INTRAVENOUS | Status: AC | PRN
  Filled 2017-06-09: qty 10

## 2017-06-09 MED ORDER — SODIUM CHLORIDE 0.9 % IV SOLN
Freq: Once | INTRAVENOUS | Status: AC
  Administered 2017-06-09: 14:00:00 via INTRAVENOUS

## 2017-06-09 NOTE — Patient Instructions (Signed)
[facility] Discharge Instructions for Patients Receiving Chemotherapy  Today you received the following chemotherapy agents: Opdivo   To help prevent nausea and vomiting after your treatment, we encourage you to take your nausea medication as prescribed.     If you develop nausea and vomiting that is not controlled by your nausea medication, call the clinic.   BELOW ARE SYMPTOMS THAT SHOULD BE REPORTED IMMEDIATELY:  *FEVER GREATER THAN 100.5 F  *CHILLS WITH OR WITHOUT FEVER  NAUSEA AND VOMITING THAT IS NOT CONTROLLED WITH YOUR NAUSEA MEDICATION  *UNUSUAL SHORTNESS OF BREATH  *UNUSUAL BRUISING OR BLEEDING  TENDERNESS IN MOUTH AND THROAT WITH OR WITHOUT PRESENCE OF ULCERS  *URINARY PROBLEMS  *BOWEL PROBLEMS  UNUSUAL RASH Items with * indicate a potential emergency and should be followed up as soon as possible.  Feel free to call the clinic you have any questions or concerns. The clinic phone number is [phone].  Please show the CHEMO ALERT CARD at check-in to the Emergency Department and triage nurse.  Nivolumab injection What is this medicine? NIVOLUMAB (nye VOL ue mab) is a monoclonal antibody. It is used to treat melanoma, lung cancer, kidney cancer, head and neck cancer, Hodgkin lymphoma, urothelial cancer, colon cancer, and liver cancer. This medicine may be used for other purposes; ask your health care provider or pharmacist if you have questions. COMMON BRAND NAME(S): Opdivo What should I tell my health care provider before I take this medicine? They need to know if you have any of these conditions: -diabetes -immune system problems -kidney disease -liver disease -lung disease -organ transplant -stomach or intestine problems -thyroid disease -an unusual or allergic reaction to nivolumab, other medicines, foods, dyes, or preservatives -pregnant or trying to get pregnant -breast-feeding How should I use this medicine? This medicine is for  infusion into a vein. It is given by a health care professional in a hospital or clinic setting. A special MedGuide will be given to you before each treatment. Be sure to read this information carefully each time. Talk to your pediatrician regarding the use of this medicine in children. While this drug may be prescribed for children as young as 12 years for selected conditions, precautions do apply. Overdosage: If you think you have taken too much of this medicine contact a poison control center or emergency room at once. NOTE: This medicine is only for you. Do not share this medicine with others. What if I miss a dose? It is important not to miss your dose. Call your doctor or health care professional if you are unable to keep an appointment. What may interact with this medicine? Interactions have not been studied. Give your health care provider a list of all the medicines, herbs, non-prescription drugs, or dietary supplements you use. Also tell them if you smoke, drink alcohol, or use illegal drugs. Some items may interact with your medicine. This list may not describe all possible interactions. Give your health care provider a list of all the medicines, herbs, non-prescription drugs, or dietary supplements you use. Also tell them if you smoke, drink alcohol, or use illegal drugs. Some items may interact with your medicine. What should I watch for while using this medicine? This drug may make you feel generally unwell. Continue your course of treatment even though you feel ill unless your doctor tells you to stop. You may need blood work done while you are taking this medicine. Do not become pregnant while taking this medicine or for 5 months   after stopping it. Women should inform their doctor if they wish to become pregnant or think they might be pregnant. There is a potential for serious side effects to an unborn child. Talk to your health care professional or pharmacist for more information. Do  not breast-feed an infant while taking this medicine. What side effects may I notice from receiving this medicine? Side effects that you should report to your doctor or health care professional as soon as possible: -allergic reactions like skin rash, itching or hives, swelling of the face, lips, or tongue -black, tarry stools -blood in the urine -bloody or watery diarrhea -changes in vision -change in sex drive -changes in emotions or moods -chest pain -confusion -cough -decreased appetite -diarrhea -facial flushing -feeling faint or lightheaded -fever, chills -hair loss -hallucination, loss of contact with reality -headache -irritable -joint pain -loss of memory -muscle pain -muscle weakness -seizures -shortness of breath -signs and symptoms of high blood sugar such as dizziness; dry mouth; dry skin; fruity breath; nausea; stomach pain; increased hunger or thirst; increased urination -signs and symptoms of kidney injury like trouble passing urine or change in the amount of urine -signs and symptoms of liver injury like dark yellow or brown urine; general ill feeling or flu-like symptoms; light-colored stools; loss of appetite; nausea; right upper belly pain; unusually weak or tired; yellowing of the eyes or skin -stiff neck -swelling of the ankles, feet, hands -weight gain Side effects that usually do not require medical attention (report to your doctor or health care professional if they continue or are bothersome): -bone pain -constipation -tiredness -vomiting This list may not describe all possible side effects. Call your doctor for medical advice about side effects. You may report side effects to FDA at 1-800-FDA-1088. Where should I keep my medicine? This drug is given in a hospital or clinic and will not be stored at home. NOTE: This sheet is a summary. It may not cover all possible information. If you have questions about this medicine, talk to your doctor,  pharmacist, or health care provider.  2018 Elsevier/Gold Standard (2016-08-02 17:49:34)  

## 2017-06-09 NOTE — Progress Notes (Signed)
Dr. Burr Medico reviewed CMET results today.  OK to proceed with chemo as per Dr. Burr Medico.

## 2017-06-10 ENCOUNTER — Telehealth: Payer: Self-pay | Admitting: *Deleted

## 2017-06-10 DIAGNOSIS — R269 Unspecified abnormalities of gait and mobility: Secondary | ICD-10-CM | POA: Diagnosis not present

## 2017-06-10 DIAGNOSIS — M25571 Pain in right ankle and joints of right foot: Secondary | ICD-10-CM | POA: Diagnosis not present

## 2017-06-10 DIAGNOSIS — M25572 Pain in left ankle and joints of left foot: Secondary | ICD-10-CM | POA: Diagnosis not present

## 2017-06-10 LAB — AFP TUMOR MARKER

## 2017-06-10 NOTE — Telephone Encounter (Signed)
-----   Message from Sherril Croon, RN sent at 06/09/2017  4:33 PM EDT ----- Regarding: Chemo Follow UP  1st time opdivo

## 2017-06-10 NOTE — Telephone Encounter (Signed)
Left message on voicemail for pt to call office to discuss possible side effects/ symptom management.

## 2017-06-13 DIAGNOSIS — M25572 Pain in left ankle and joints of left foot: Secondary | ICD-10-CM | POA: Diagnosis not present

## 2017-06-13 DIAGNOSIS — M25571 Pain in right ankle and joints of right foot: Secondary | ICD-10-CM | POA: Diagnosis not present

## 2017-06-13 DIAGNOSIS — R269 Unspecified abnormalities of gait and mobility: Secondary | ICD-10-CM | POA: Diagnosis not present

## 2017-06-15 DIAGNOSIS — M25572 Pain in left ankle and joints of left foot: Secondary | ICD-10-CM | POA: Diagnosis not present

## 2017-06-15 DIAGNOSIS — M25571 Pain in right ankle and joints of right foot: Secondary | ICD-10-CM | POA: Diagnosis not present

## 2017-06-15 DIAGNOSIS — R269 Unspecified abnormalities of gait and mobility: Secondary | ICD-10-CM | POA: Diagnosis not present

## 2017-06-16 ENCOUNTER — Other Ambulatory Visit: Payer: Self-pay | Admitting: Radiology

## 2017-06-17 ENCOUNTER — Ambulatory Visit (HOSPITAL_COMMUNITY)
Admission: RE | Admit: 2017-06-17 | Discharge: 2017-06-17 | Disposition: A | Discharge status: Home / Self Care | Payer: Medicare Other | Source: Ambulatory Visit | Attending: Hematology | Admitting: Hematology

## 2017-06-17 ENCOUNTER — Other Ambulatory Visit: Payer: Self-pay | Admitting: General Surgery

## 2017-06-17 ENCOUNTER — Encounter (HOSPITAL_COMMUNITY): Payer: Self-pay

## 2017-06-17 ENCOUNTER — Other Ambulatory Visit: Payer: Self-pay | Admitting: Physician Assistant

## 2017-06-17 DIAGNOSIS — Z5309 Procedure and treatment not carried out because of other contraindication: Secondary | ICD-10-CM | POA: Insufficient documentation

## 2017-06-17 DIAGNOSIS — C220 Liver cell carcinoma: Secondary | ICD-10-CM

## 2017-06-17 LAB — CBC
HCT: 35.2 % — ABNORMAL LOW (ref 36.0–46.0)
Hemoglobin: 12.4 g/dL (ref 12.0–15.0)
MCH: 28.5 pg (ref 26.0–34.0)
MCHC: 35.2 g/dL (ref 30.0–36.0)
MCV: 80.9 fL (ref 78.0–100.0)
PLATELETS: 177 10*3/uL (ref 150–400)
RBC: 4.35 MIL/uL (ref 3.87–5.11)
RDW: 20.2 % — AB (ref 11.5–15.5)
WBC: 8.6 10*3/uL (ref 4.0–10.5)

## 2017-06-17 LAB — GLUCOSE, CAPILLARY: GLUCOSE-CAPILLARY: 134 mg/dL — AB (ref 65–99)

## 2017-06-17 LAB — PROTIME-INR
INR: 1.32
PROTHROMBIN TIME: 16.5 s — AB (ref 11.4–15.2)

## 2017-06-17 LAB — APTT: aPTT: 35 seconds (ref 24–36)

## 2017-06-17 MED ORDER — CEFAZOLIN SODIUM-DEXTROSE 2-4 GM/100ML-% IV SOLN: 2.0000 g | Freq: Once | INTRAVENOUS | Status: DC

## 2017-06-17 MED ORDER — SODIUM CHLORIDE 0.9 % IV SOLN
INTRAVENOUS | Status: DC
  Administered 2017-06-17: 10:00:00 via INTRAVENOUS

## 2017-06-17 NOTE — Progress Notes (Signed)
Patient presents today for [facility] placement.  After being instructed to not eat or drink after MN, she drank 8OZ of water at 0900.  Her procedure is scheduled at 1130.  Her procedure will be cancelled today.  She is rescheduled for Monday at 1500.  It has been thoroughly discussed with her that she can NOT eat or drink after 0830 on Monday.  She understands.    Shalaine Payson E 10:15 AM 06/17/2017

## 2017-06-17 NOTE — Progress Notes (Signed)
After patient asked multiple times with son not in room, asked patient again about last time she ate and drink, her son spoke up that she had water this morning, patient finally agreed that she had 8 oz of water at 0900, [employer] PA notified.

## 2017-06-19 ENCOUNTER — Other Ambulatory Visit: Payer: Self-pay | Admitting: Radiology

## 2017-06-20 ENCOUNTER — Ambulatory Visit (HOSPITAL_COMMUNITY)
Admission: RE | Admit: 2017-06-20 | Discharge: 2017-06-20 | Disposition: A | Discharge status: Home / Self Care | Payer: Medicare Other | Source: Ambulatory Visit | Attending: Hematology | Admitting: Hematology

## 2017-06-20 ENCOUNTER — Encounter (HOSPITAL_COMMUNITY): Payer: Self-pay

## 2017-06-20 ENCOUNTER — Other Ambulatory Visit: Payer: Self-pay | Admitting: Hematology

## 2017-06-20 DIAGNOSIS — G4730 Sleep apnea, unspecified: Secondary | ICD-10-CM | POA: Diagnosis not present

## 2017-06-20 DIAGNOSIS — C220 Liver cell carcinoma: Secondary | ICD-10-CM

## 2017-06-20 DIAGNOSIS — I10 Essential (primary) hypertension: Secondary | ICD-10-CM | POA: Insufficient documentation

## 2017-06-20 DIAGNOSIS — M8580 Other specified disorders of bone density and structure, unspecified site: Secondary | ICD-10-CM | POA: Insufficient documentation

## 2017-06-20 DIAGNOSIS — Z452 Encounter for adjustment and management of vascular access device: Secondary | ICD-10-CM | POA: Insufficient documentation

## 2017-06-20 DIAGNOSIS — E1151 Type 2 diabetes mellitus with diabetic peripheral angiopathy without gangrene: Secondary | ICD-10-CM | POA: Insufficient documentation

## 2017-06-20 DIAGNOSIS — Z79899 Other long term (current) drug therapy: Secondary | ICD-10-CM | POA: Diagnosis not present

## 2017-06-20 DIAGNOSIS — Z96652 Presence of left artificial knee joint: Secondary | ICD-10-CM | POA: Diagnosis not present

## 2017-06-20 DIAGNOSIS — Z794 Long term (current) use of insulin: Secondary | ICD-10-CM | POA: Insufficient documentation

## 2017-06-20 DIAGNOSIS — Z886 Allergy status to analgesic agent status: Secondary | ICD-10-CM | POA: Insufficient documentation

## 2017-06-20 DIAGNOSIS — E785 Hyperlipidemia, unspecified: Secondary | ICD-10-CM | POA: Insufficient documentation

## 2017-06-20 DIAGNOSIS — K219 Gastro-esophageal reflux disease without esophagitis: Secondary | ICD-10-CM | POA: Diagnosis not present

## 2017-06-20 DIAGNOSIS — Z8505 Personal history of malignant neoplasm of liver: Secondary | ICD-10-CM | POA: Diagnosis not present

## 2017-06-20 DIAGNOSIS — E039 Hypothyroidism, unspecified: Secondary | ICD-10-CM | POA: Diagnosis not present

## 2017-06-20 DIAGNOSIS — E1142 Type 2 diabetes mellitus with diabetic polyneuropathy: Secondary | ICD-10-CM | POA: Diagnosis not present

## 2017-06-20 DIAGNOSIS — Z5111 Encounter for antineoplastic chemotherapy: Secondary | ICD-10-CM | POA: Diagnosis not present

## 2017-06-20 HISTORY — PX: IR US GUIDE VASC ACCESS RIGHT: IMG2390

## 2017-06-20 HISTORY — PX: IR FLUORO GUIDE PORT INSERTION RIGHT: IMG5741

## 2017-06-20 LAB — GLUCOSE, CAPILLARY: Glucose-Capillary: 132 mg/dL — ABNORMAL HIGH (ref 65–99)

## 2017-06-20 LAB — PROTIME-INR
INR: 1.3
PROTHROMBIN TIME: 16.2 s — AB (ref 11.4–15.2)

## 2017-06-20 MED ORDER — ONDANSETRON HCL 4 MG/2ML IJ SOLN
INTRAMUSCULAR | Status: AC
  Administered 2017-06-20: 4 mg
  Filled 2017-06-20: qty 2

## 2017-06-20 MED ORDER — CEFAZOLIN SODIUM-DEXTROSE 2-4 GM/100ML-% IV SOLN
INTRAVENOUS | Status: AC
Start: 2017-06-20 — End: 2017-06-20
  Administered 2017-06-20: 2 g via INTRAVENOUS
  Filled 2017-06-20: qty 100

## 2017-06-20 MED ORDER — FENTANYL CITRATE (PF) 100 MCG/2ML IJ SOLN
INTRAMUSCULAR | Status: AC | PRN
  Administered 2017-06-20 (×2): 50 ug via INTRAVENOUS

## 2017-06-20 MED ORDER — HEPARIN SOD (PORK) LOCK FLUSH 100 UNIT/ML IV SOLN
INTRAVENOUS | Status: AC
  Filled 2017-06-20: qty 5

## 2017-06-20 MED ORDER — FENTANYL CITRATE (PF) 100 MCG/2ML IJ SOLN
INTRAMUSCULAR | Status: DC
Start: 2017-06-20 — End: 2017-06-21
  Filled 2017-06-20: qty 4

## 2017-06-20 MED ORDER — SODIUM CHLORIDE 0.9 % IV SOLN
INTRAVENOUS | Status: DC
  Administered 2017-06-20: 15:00:00 via INTRAVENOUS

## 2017-06-20 MED ORDER — LIDOCAINE-EPINEPHRINE (PF) 2 %-1:200000 IJ SOLN
INTRAMUSCULAR | Status: AC | PRN
  Administered 2017-06-20: 20 mL

## 2017-06-20 MED ORDER — MIDAZOLAM HCL 2 MG/2ML IJ SOLN
INTRAMUSCULAR | Status: AC | PRN
  Administered 2017-06-20 (×2): 1 mg via INTRAVENOUS

## 2017-06-20 MED ORDER — CEFAZOLIN SODIUM-DEXTROSE 2-4 GM/100ML-% IV SOLN
2.0000 g | INTRAVENOUS | Status: AC
  Administered 2017-06-20: 2 g via INTRAVENOUS

## 2017-06-20 MED ORDER — LIDOCAINE-EPINEPHRINE (PF) 2 %-1:200000 IJ SOLN
INTRAMUSCULAR | Status: AC
  Filled 2017-06-20: qty 20

## 2017-06-20 MED ORDER — MIDAZOLAM HCL 2 MG/2ML IJ SOLN
INTRAMUSCULAR | Status: AC
  Filled 2017-06-20: qty 4

## 2017-06-20 NOTE — H&P (Signed)
Referring Physician(s): Feng,Yan  Supervising Physician: Sandi Mariscal  Patient Status:  [facility] OP  Chief Complaint: "I'm here to get a port a cath"   Subjective: Pt familiar to IR service from prior liver mass biopsy in 2017 as well as right hepatic DEB-TACE x2 secondary to [facility] 2017 as well as hepatic Y- 90 in  December 2017. She now has evidence of disease progression and presents today for Port-A-Cath placement for additional chemotherapy.she denies fever, headache, or abnormal bleeding. She does have some intermittent chest discomfort, occasional dyspnea as well as abdominal/back pain and nausea. Past Medical History:  Diagnosis Date  . Adhesive capsulitis of right shoulder   . Arthritis   . Complication of anesthesia 5/13   did have some disorientation post cerv fusion due to low sats  . Diabetes mellitus ([place])   . GERD (gastroesophageal reflux disease)   . Hyperlipemia   . Hypertension   . Hypothyroidism   . Left knee DJD   . Osteopenia   . Partial tear of rotator cuff(726.13) left  . Peripheral neuropathy    fingers   . Peripheral vascular disease ([place])   . Seasonal allergies   . Seasonal allergies   . Sleep apnea    CPAP, sleep study 3-4 years ago in [place], [place] Dr. Elbert Ewings   . Urgency of urination   . Wears glasses    Past Surgical History:  Procedure Laterality Date  . ABDOMINAL HYSTERECTOMY     complete  . ANTERIOR CERVICAL DECOMP/DISCECTOMY FUSION  03/10/2012   Procedure: ANTERIOR CERVICAL DECOMPRESSION/DISCECTOMY FUSION 1 LEVEL;  Surgeon: Eustace Moore, MD;  Location: [place] [facility]URO ORS;  Service: Neurosurgery;  Laterality: N/A;  Cervical five-six anterior cervical decompression fusion with plating  . CARPAL TUNNEL RELEASE     bilat  . CERVICAL FUSION    . CHOLECYSTECTOMY  2009  . COLONOSCOPY W/ POLYPECTOMY    . COLONOSCOPY WITH PROPOFOL N/A 05/23/2015   Procedure: COLONOSCOPY WITH PROPOFOL;  Surgeon: Carol Ada, MD;  Location: [facility];  Service:  Endoscopy;  Laterality: N/A;  . ESOPHAGOGASTRODUODENOSCOPY (EGD) WITH PROPOFOL N/A 10/10/2015   Procedure: ESOPHAGOGASTRODUODENOSCOPY (EGD) WITH PROPOFOL;  Surgeon: Carol Ada, MD;  Location: [facility];  Service: Endoscopy;  Laterality: N/A;  . EYE SURGERY  2012   bilat cataract  . HEEL SPUR EXCISION     bilat  . HEMORRHOID SURGERY  2009  . IR GENERIC HISTORICAL  05/31/2016   IR ANGIOGRAM SELECTIVE EACH ADDITIONAL VESSEL 05/31/2016 Greggory Keen, MD [facility]  . IR GENERIC HISTORICAL  05/31/2016   IR ANGIOGRAM VISCERAL SELECTIVE 05/31/2016 Greggory Keen, MD [facility]  . IR GENERIC HISTORICAL  05/31/2016   IR US GUIDE VASC ACCESS RIGHT 05/31/2016 Greggory Keen, MD [facility]  . IR GENERIC HISTORICAL  05/31/2016   IR ANGIOGRAM SELECTIVE EACH ADDITIONAL VESSEL 05/31/2016 Greggory Keen, MD [facility]  . IR GENERIC HISTORICAL  05/31/2016   IR EMBO TUMOR ORGAN ISCHEMIA INFARCT INC GUIDE ROADMAPPING 05/31/2016 Greggory Keen, MD [facility]  . IR GENERIC HISTORICAL  10/18/2016   IR ANGIOGRAM SELECTIVE EACH ADDITIONAL VESSEL 10/18/2016 Greggory Keen, MD [facility]  . IR GENERIC HISTORICAL  10/18/2016   IR ANGIOGRAM SELECTIVE EACH ADDITIONAL VESSEL 10/18/2016 Greggory Keen, MD [facility]  . IR GENERIC HISTORICAL  10/18/2016   IR US GUIDE VASC ACCESS RIGHT 10/18/2016 Greggory Keen, MD [facility]  . IR GENERIC HISTORICAL  10/18/2016   IR ANGIOGRAM VISCERAL SELECTIVE 10/18/2016 Greggory Keen, MD [facility]  .  IR GENERIC HISTORICAL  10/18/2016   IR EMBO ARTERIAL NOT HEMORR HEMANG INC GUIDE ROADMAPPING 10/18/2016 Greggory Keen, MD [facility]  . IR GENERIC HISTORICAL  10/18/2016   IR ANGIOGRAM SELECTIVE EACH ADDITIONAL VESSEL 10/18/2016 Greggory Keen, MD [facility]  . IR GENERIC HISTORICAL  10/18/2016   IR ANGIOGRAM SELECTIVE EACH ADDITIONAL VESSEL 10/18/2016 Greggory Keen, MD [facility]  . IR GENERIC HISTORICAL  10/05/2016   IR RADIOLOGIST EVAL & MGMT 10/05/2016  Greggory Keen, MD [facility]  . IR GENERIC HISTORICAL  11/03/2016   IR EMBO TUMOR ORGAN ISCHEMIA INFARCT INC GUIDE ROADMAPPING 11/03/2016 Arne Cleveland, MD [facility]  . IR GENERIC HISTORICAL  11/03/2016   IR ANGIOGRAM VISCERAL SELECTIVE 11/03/2016 Arne Cleveland, MD [facility]  . IR GENERIC HISTORICAL  11/03/2016   IR ANGIOGRAM SELECTIVE EACH ADDITIONAL VESSEL 11/03/2016 Arne Cleveland, MD [facility]  . IR GENERIC HISTORICAL  11/03/2016   IR US GUIDE VASC ACCESS RIGHT 11/03/2016 Arne Cleveland, MD [facility]  . IR GENERIC HISTORICAL  11/03/2016   IR ANGIOGRAM SELECTIVE EACH ADDITIONAL VESSEL 11/03/2016 Arne Cleveland, MD [facility]  . IR GENERIC HISTORICAL  11/03/2016   IR ANGIOGRAM SELECTIVE EACH ADDITIONAL VESSEL 11/03/2016 Arne Cleveland, MD [facility]  . IR GENERIC HISTORICAL  11/03/2016   IR ANGIOGRAM SELECTIVE EACH ADDITIONAL VESSEL 11/03/2016 Arne Cleveland, MD [facility]  . IR GENERIC HISTORICAL  07/07/2016   IR RADIOLOGIST EVAL & MGMT 07/07/2016 Greggory Keen, MD [facility]  . IR GENERIC HISTORICAL  05/18/2016   IR RADIOLOGIST EVAL & MGMT 05/18/2016 [facility]  . IR GENERIC HISTORICAL  12/08/2016   IR RADIOLOGIST EVAL & MGMT 12/08/2016 Greggory Keen, MD [facility]  . IR RADIOLOGIST EVAL & MGMT  03/01/2017  . KNEE ARTHROSCOPY     Left x2  . NOSE SURGERY    . SHOULDER ARTHROSCOPY  06/27/2012   Procedure: ARTHROSCOPY SHOULDER;  Surgeon: Lorn Junes, MD;  Location: [place];  Service: Orthopedics;  Laterality: Right;  right shoulder arthroscopy debridement extensive, distal clavulectomy, with resect adhesions with maniplulation  . TONSILLECTOMY    . TOTAL KNEE ARTHROPLASTY Left 07/02/2013   Procedure: TOTAL KNEE ARTHROPLASTY- left;  Surgeon: Lorn Junes, MD;  Location: [place];  Service: Orthopedics;  Laterality: Left;  Marland Kitchen VARICOSE VEIN SURGERY     Right     Allergies: Asa [aspirin]; Adhesive [tape]; and Tylenol  [acetaminophen]  Medications: Prior to Admission medications   Medication Sig Start Date End Date Taking? Authorizing Provider  alendronate (FOSAMAX) 70 MG tablet Take 70 mg by mouth once a week.  02/10/17   [provider]  amLODipine (NORVASC) 5 MG tablet Take 5 mg by mouth every morning.     [provider]  B-D UF III MINI PEN NEEDLES 31G X 5 MM MISC  03/01/17   [provider]  BD PEN NEEDLE NANO U/F 32G X 4 MM MISC  01/28/17   [provider]  calcium-vitamin D (OSCAL WITH D) 250-125 MG-UNIT tablet Take 1 tablet by mouth daily.    [provider]  COLCRYS 0.6 MG tablet Take 0.6 mg by mouth daily.  02/07/17   [provider]  cycloSPORINE (RESTASIS) 0.05 % ophthalmic emulsion Place 1 drop into both eyes 2 (two) times daily.    [provider]  dexlansoprazole (DEXILANT) 60 MG capsule Take 60 mg by mouth every evening.     [provider]  docusate sodium (COLACE)  100 MG capsule Take 1 capsule (100 mg total) by mouth daily as needed for mild constipation. 01/20/16   Saverio Danker, PA-C  DULoxetine (CYMBALTA) 60 MG capsule Take 60 mg by mouth every evening.     [provider]  esomeprazole (NEXIUM) 40 MG capsule Take 40 mg by mouth every morning.     [provider]  fluticasone (FLONASE) 50 MCG/ACT nasal spray Place 1 spray into both nostrils 2 (two) times daily as needed for allergies or rhinitis.     [provider]  FREESTYLE LITE test strip  12/29/16   [provider]  furosemide (LASIX) 20 MG tablet Take 20 mg by mouth daily. Reported on 02/10/2016 10/20/15   [provider]  gabapentin (NEURONTIN) 300 MG capsule Take 300 mg by mouth at bedtime.     [provider]  hydrochlorothiazide (MICROZIDE) 12.5 MG capsule Take 12.5 mg by mouth daily.     [provider]  insulin aspart protamine- aspart (NOVOLOG MIX 70/30) (70-30) 100 UNIT/ML injection Inject 8 Units  into the skin daily with breakfast.    [provider]  insulin aspart protamine- aspart (NOVOLOG MIX 70/30) (70-30) 100 UNIT/ML injection Inject 10 Units into the skin daily with supper.    [provider]  levothyroxine (SYNTHROID, LEVOTHROID) 112 MCG tablet Take 112 mcg by mouth daily. 12/22/15   [provider]  lidocaine-prilocaine (EMLA) cream Apply to affected area once 06/01/17   Truitt Merle, MD  loperamide (IMODIUM A-D) 2 MG tablet Take 2 mg by mouth at bedtime.    [provider]  lovastatin (MEVACOR) 40 MG tablet Take 40 mg by mouth at bedtime.    [provider]  Multiple Vitamins-Minerals (CENTRUM SILVER PO) Take 1 tablet by mouth every morning.     [provider]  Olopatadine HCl (PATANASE) 0.6 % SOLN Place 1 each into the nose daily as needed (for allergies).    [provider]  Omega-3 Fatty Acids (FISH OIL PO) Take 1 tablet by mouth daily.    [provider]  ranitidine (ZANTAC) 150 MG tablet Take 150 mg by mouth at bedtime. Reported on 11/21/2015    [provider]  telmisartan (MICARDIS) 80 MG tablet Take 80 mg by mouth every morning.     [provider]  tetrahydrozoline (VISINE) 0.05 % ophthalmic solution Place 1 drop into both eyes 2 (two) times daily as needed (for allergies).     [provider]  traMADol (ULTRAM) 50 MG tablet Take 1 tablet (50 mg total) by mouth every 8 (eight) hours as needed. 06/01/17   Truitt Merle, MD     Vital Signs:pend    Physical Exam awake, alert. Scleral icterus noted. Chest with slightly diminished breath sounds bases, heart with regular rate and rhythm. Abdomen soft, positive bowel sounds,mildly distended, mild diffuse tenderness to palpation; lower extremity edema noted.  Imaging: No results found.  Labs:  CBC:  Recent Labs  05/13/17 0830 06/01/17 1307 06/09/17 1147 06/17/17 0948  WBC 4.1 4.0 7.1 8.6  HGB 11.7 12.0 12.4 12.4  HCT 36.3  37.4 37.6 35.2*  PLT 75* 113* 112* 177    COAGS:  Recent Labs  10/18/16 0902 11/03/16 0732 02/24/17 1106 06/17/17 0948  INR 1.05 1.06 1.1 1.32  APTT  --   --   --  35    BMP:  Recent Labs  10/18/16 0902 11/03/16 0732 02/24/17 1106  04/25/17 1048 05/13/17 0830 06/01/17 1307 06/09/17 1147  NA 137 140 140  < > 135* 139 137 133*  K 3.7 3.7 3.9  < > 4.2 3.4* 3.9 3.8  CL 105 105 106  --   --   --   --   --   CO2 25 24 26   < > 23 25 22 24   GLUCOSE 169* 161* 160*  < > 235* 190* 168* 186*  BUN 13 14 10   < > 11.0 9.6 14.3 28.6*  CALCIUM 9.2 8.9 8.6  < > 8.5 8.3* 8.1* 8.6  CREATININE 0.70 0.64 0.64  < > 0.7 0.7 0.8 0.8  GFRNONAA >60 >60  --   --   --   --   --   --   GFRAA >60 >60  --   --   --   --   --   --   < > = values in this interval not displayed.  LIVER FUNCTION TESTS:  Recent Labs  04/25/17 1048 05/13/17 0830 06/01/17 1307 06/09/17 1147  BILITOT 1.81* 2.38* 3.83* 6.46*  AST 94* 103* 154* 213*  ALT 56* 79* 82* 99*  ALKPHOS 349* 409* 462* 638*  PROT 7.0 7.2 6.9 6.9  ALBUMIN 2.4* 2.3* 1.9* 1.9*    Assessment and Plan: Patient with history of  hepatocellular carcinoma, status post hepatic DEB-TACE and Y-90 treatments in 2017; now with marked disease progression; she presents today for Port-A-Cath placement for additional chemotherapy.Risks and benefits discussed with the patient/son including, but not limited to bleeding, infection, pneumothorax, or fibrin sheath development and need for additional procedures.All of the patient's questions were answered, patient is agreeable to proceed.Consent signed and in chart.     Electronically Signed: D. Rowe Robert, PA-C 06/20/2017, 3:14 PM   I spent a total of 20 minutes  at the the patient's bedside AND on the patient's hospital floor or unit, greater than 50% of which was counseling/coordinating care for port a cath placement

## 2017-06-20 NOTE — Discharge Instructions (Signed)
Implanted Port Home Guide °An implanted port is a type of central line that is placed under the skin. Central lines are used to provide IV access when treatment or nutrition needs to be given through a person’s veins. Implanted ports are used for long-term IV access. An implanted port may be placed because: °· You need IV medicine that would be irritating to the small veins in your hands or arms. °· You need long-term IV medicines, such as antibiotics. °· You need IV nutrition for a long period. °· You need frequent blood draws for lab tests. °· You need dialysis. ° °Implanted ports are usually placed in the chest area, but they can also be placed in the upper arm, the abdomen, or the leg. An implanted port has two main parts: °· Reservoir. The reservoir is round and will appear as a small, raised area under your skin. The reservoir is the part where a needle is inserted to give medicines or draw blood. °· Catheter. The catheter is a thin, flexible tube that extends from the reservoir. The catheter is placed into a large vein. Medicine that is inserted into the reservoir goes into the catheter and then into the vein. ° °How will I care for my incision site? °Do not get the incision site wet. Bathe or shower as directed by your health care provider. °How is my port accessed? °Special steps must be taken to access the port: °· Before the port is accessed, a numbing cream can be placed on the skin. This helps numb the skin over the port site. °· Your health care provider uses a sterile technique to access the port. °? Your health care provider must put on a mask and sterile gloves. °? The skin over your port is cleaned carefully with an antiseptic and allowed to dry. °? The port is gently pinched between sterile gloves, and a needle is inserted into the port. °· Only "non-coring" port needles should be used to access the port. Once the port is accessed, a blood return should be checked. This helps ensure that the port  is in the vein and is not clogged. °· If your port needs to remain accessed for a constant infusion, a clear (transparent) bandage will be placed over the needle site. The bandage and needle will need to be changed every week, or as directed by your health care provider. °· Keep the bandage covering the needle clean and dry. Do not get it wet. Follow your health care provider’s instructions on how to take a shower or bath while the port is accessed. °· If your port does not need to stay accessed, no bandage is needed over the port. ° °What is flushing? °Flushing helps keep the port from getting clogged. Follow your health care provider’s instructions on how and when to flush the port. Ports are usually flushed with saline solution or a medicine called heparin. The need for flushing will depend on how the port is used. °· If the port is used for intermittent medicines or blood draws, the port will need to be flushed: °? After medicines have been given. °? After blood has been drawn. °? As part of routine maintenance. °· If a constant infusion is running, the port may not need to be flushed. ° °How long will my port stay implanted? °The port can stay in for as long as your health care provider thinks it is needed. When it is time for the port to come out, surgery will be   done to remove it. The procedure is similar to the one performed when the port was put in. °When should I seek immediate medical care? °When you have an implanted port, you should seek immediate medical care if: °· You notice a bad smell coming from the incision site. °· You have swelling, redness, or drainage at the incision site. °· You have more swelling or pain at the port site or the surrounding area. °· You have a fever that is not controlled with medicine. ° °This information is not intended to replace advice given to you by your health care provider. Make sure you discuss any questions you have with your health care provider. °Document  Released: 10/25/2005 Document Revised: 04/01/2016 Document Reviewed: 07/02/2013 °[employer] Interactive Patient Education © 2017 [employer] °Moderate Conscious Sedation, Adult, Care After °These instructions provide you with information about caring for yourself after your procedure. Your health care provider may also give you more specific instructions. Your treatment has been planned according to current medical practices, but problems sometimes occur. Call your health care provider if you have any problems or questions after your procedure. °What can I expect after the procedure? °After your procedure, it is common: °· To feel sleepy for several hours. °· To feel clumsy and have poor balance for several hours. °· To have poor judgment for several hours. °· To vomit if you eat too soon. ° °Follow these instructions at home: °For at least 24 hours after the procedure: ° °· Do not: °? Participate in activities where you could fall or become injured. °? Drive. °? Use heavy machinery. °? Drink alcohol. °? Take sleeping pills or medicines that cause drowsiness. °? Make important decisions or sign legal documents. °? Take care of children on your own. °· Rest. °Eating and drinking °· Follow the diet recommended by your health care provider. °· If you vomit: °? Drink water, juice, or soup when you can drink without vomiting. °? Make sure you have little or no nausea before eating solid foods. °General instructions °· Have a responsible adult stay with you until you are awake and alert. °· Take over-the-counter and prescription medicines only as told by your health care provider. °· If you smoke, do not smoke without supervision. °· Keep all follow-up visits as told by your health care provider. This is important. °Contact a health care provider if: °· You keep feeling nauseous or you keep vomiting. °· You feel light-headed. °· You develop a rash. °· You have a fever. °Get help right away if: °· You have trouble  breathing. °This information is not intended to replace advice given to you by your health care provider. Make sure you discuss any questions you have with your health care provider. °Document Released: 08/15/2013 Document Revised: 03/29/2016 Document Reviewed: 02/14/2016 °[employer] Interactive Patient Education © 2018 [employer] ° °

## 2017-06-20 NOTE — Procedures (Signed)
Pre Procedure Dx: [facility] Post Procedural Dx: Same  Successful placement of right IJ approach port-a-cath with tip at the superior caval atrial junction. The catheter is ready for immediate use.  Estimated Blood Loss: Minimal  Complications: None immediate.  Jay Rily Nickey, MD Pager #: [phone]   

## 2017-06-23 ENCOUNTER — Other Ambulatory Visit (HOSPITAL_BASED_OUTPATIENT_CLINIC_OR_DEPARTMENT_OTHER): Payer: Medicare Other

## 2017-06-23 ENCOUNTER — Ambulatory Visit: Payer: Medicare Other

## 2017-06-23 ENCOUNTER — Ambulatory Visit (HOSPITAL_BASED_OUTPATIENT_CLINIC_OR_DEPARTMENT_OTHER): Payer: Medicare Other | Admitting: Hematology

## 2017-06-23 ENCOUNTER — Encounter: Payer: Self-pay | Admitting: Hematology

## 2017-06-23 VITALS — BP 127/51 | HR 95 | Temp 98.7°F | Resp 18 | Ht 64.25 in | Wt 216.2 lb

## 2017-06-23 DIAGNOSIS — K7290 Hepatic failure, unspecified without coma: Secondary | ICD-10-CM

## 2017-06-23 DIAGNOSIS — I10 Essential (primary) hypertension: Secondary | ICD-10-CM | POA: Diagnosis not present

## 2017-06-23 DIAGNOSIS — Z95828 Presence of other vascular implants and grafts: Secondary | ICD-10-CM

## 2017-06-23 DIAGNOSIS — E119 Type 2 diabetes mellitus without complications: Secondary | ICD-10-CM

## 2017-06-23 DIAGNOSIS — G893 Neoplasm related pain (acute) (chronic): Secondary | ICD-10-CM

## 2017-06-23 DIAGNOSIS — C220 Liver cell carcinoma: Secondary | ICD-10-CM

## 2017-06-23 DIAGNOSIS — K7581 Nonalcoholic steatohepatitis (NASH): Secondary | ICD-10-CM

## 2017-06-23 DIAGNOSIS — K7460 Unspecified cirrhosis of liver: Secondary | ICD-10-CM

## 2017-06-23 LAB — COMPREHENSIVE METABOLIC PANEL
ALBUMIN: 1.5 g/dL — AB (ref 3.5–5.0)
ALT: 76 U/L — AB (ref 0–55)
Alkaline Phosphatase: 597 U/L — ABNORMAL HIGH (ref 40–150)
Anion Gap: 10 mEq/L (ref 3–11)
BUN: 39.5 mg/dL — ABNORMAL HIGH (ref 7.0–26.0)
CALCIUM: 8.4 mg/dL (ref 8.4–10.4)
CO2: 16 meq/L — AB (ref 22–29)
Chloride: 101 mEq/L (ref 98–109)
Creatinine: 0.9 mg/dL (ref 0.6–1.1)
EGFR: 65 mL/min/{1.73_m2} — ABNORMAL LOW (ref >90)
Glucose: 158 mg/dl — ABNORMAL HIGH (ref 70–140)
Potassium: 4.5 mEq/L (ref 3.5–5.1)
SODIUM: 127 meq/L — AB (ref 136–145)
TOTAL PROTEIN: 6.4 g/dL (ref 6.4–8.3)
Total Bilirubin: 10.52 mg/dL (ref 0.20–1.20)

## 2017-06-23 LAB — CBC WITH DIFFERENTIAL/PLATELET
BASO%: 1 % (ref 0.0–2.0)
Basophils Absolute: 0.1 10*3/uL (ref 0.0–0.1)
EOS%: 1.2 % (ref 0.0–7.0)
Eosinophils Absolute: 0.1 10*3/uL (ref 0.0–0.5)
HEMATOCRIT: 36 % (ref 34.8–46.6)
HEMOGLOBIN: 12.2 g/dL (ref 11.6–15.9)
LYMPH#: 0.9 10*3/uL (ref 0.9–3.3)
LYMPH%: 15.3 % (ref 14.0–49.7)
MCH: 27.7 pg (ref 25.1–34.0)
MCHC: 33.9 g/dL (ref 31.5–36.0)
MCV: 81.8 fL (ref 79.5–101.0)
MONO#: 1.4 10*3/uL — ABNORMAL HIGH (ref 0.1–0.9)
MONO%: 22.6 % — AB (ref 0.0–14.0)
NEUT%: 59.9 % (ref 38.4–76.8)
NEUTROS ABS: 3.6 10*3/uL (ref 1.5–6.5)
Platelets: 137 10*3/uL — ABNORMAL LOW (ref 145–400)
RBC: 4.4 10*6/uL (ref 3.70–5.45)
RDW: 21.4 % — AB (ref 11.2–14.5)
WBC: 6 10*3/uL (ref 3.9–10.3)

## 2017-06-23 MED ORDER — HEPARIN SOD (PORK) LOCK FLUSH 100 UNIT/ML IV SOLN
500.0000 [IU] | Freq: Once | INTRAVENOUS | Status: AC
  Administered 2017-06-23: 500 [IU]
  Filled 2017-06-23: qty 5

## 2017-06-23 MED ORDER — SODIUM CHLORIDE 0.9% FLUSH
10.0000 mL | INTRAVENOUS | Status: DC | PRN
  Administered 2017-06-23: 10 mL via INTRAVENOUS
  Filled 2017-06-23: qty 10

## 2017-06-23 MED ORDER — OXYCODONE HCL 5 MG PO TABS: 5.0000 mg | ORAL_TABLET | Freq: Four times a day (QID) | ORAL | 0 refills | Status: AC | PRN

## 2017-06-23 MED ORDER — SODIUM CHLORIDE 0.9% FLUSH
10.0000 mL | Freq: Once | INTRAVENOUS | Status: AC
  Administered 2017-06-23: 10 mL
  Filled 2017-06-23: qty 10

## 2017-06-23 NOTE — Progress Notes (Signed)
When deaccessed huber needle at port site, nurse noticed skin tear at port area.  No bleeding, no drainage noted.   Area cleansed with normal saline - wet to dry.  Telfa dressing applied and hypafix tape cover. Hospice referral made today to [facility].

## 2017-06-23 NOTE — Progress Notes (Signed)
[facility]  Telephone:([phone]) [phone] Fax:(336) [phone]  Clinic follow Up Note   Patient Care Team: Thressa Sheller, MD as PCP - General (Internal Medicine) Carol Ada, MD as Consulting Physician (Gastroenterology) Stark Klein, MD as Consulting Physician (General Surgery) Greggory Keen, MD as Consulting Physician (Interventional Radiology) 06/23/2017    CHIEF COMPLAINTS:  Follow up [facility]    Oncology History   Hepatocellular carcinoma [facility])   Staging form: Liver (Excluding Intrahepatic Bile Ducts), AJCC 7th Edition     Clinical stage from 12/01/2015: Stage I (T1, N0, M0) - Signed by Truitt Merle, MD on 04/12/2016       Hepatocellular carcinoma ([place])   10/27/2015 Imaging    Liver MRI with and without contrast showed a 4 cm lesion in the segment 8 liver, image characteristics  favor [place]. Mild hepatomegaly, no other metastasis.      11/21/2015 Initial Diagnosis    Hepatocellular carcinoma ([place])      12/01/2015 Initial Biopsy    Liver core needle biopsy showed hepatocellular carcinoma, moderately differentiated.      01/19/2016 Procedure    TACE to the right lobe liver cancer by Dr. Annamaria Boots       02/13/2016 Imaging    Abdominal MRI showed slightly smaller segment 8 liver lesion with reduced amount of central enhancement. Morphological findings of cirrhosis with background hepatic steatosis. Borderline prominent portal hepatis lymph node 1.3 cm, stable.      05/18/2016 Imaging    MRI abdomen with and without contrast 05/18/2016 IMPRESSION: No significant change in size of right hepatic lobe mass in segment 8. No new or progressive hepatic neoplasm identified. Stable mild porta hepatis lymphadenopathy measuring up to 1.3 cm. No new or progressive lymphadenopathy identified. Stable hepatic cirrhosis and findings of portal venous hypertension, including mild splenomegaly and upper abdominal portosystemic venous collaterals.      05/31/2016 Procedure    Repeat  visceral angiograms with peripheral right hepatic DEB- TACE to dominant right hepatic vasculature supplying the known hepatocellular carcinoma segment 8 on 05/31/16 by Dr. Annamaria Boots      10/05/2016 Imaging    MRI abdomen with and without contrast 10/05/2016 IMPRESSION: 1. Mild interval increase in size of central RIGHT hepatic lobe mass. 2. Interval extension of the tumor mass into the middle hepatic vein consistent with tumor progression. 3. Splenomegaly.  Portal vein is patent.  No ascites.      11/03/2016 Procedure    Y-90 radioembolization of the left hepatic lobe by Dr. Vernard Gambles on 11/03/16.      03/01/2017 Imaging    MRI abdomen with and without contrast 03/01/2017 IMPRESSION: 1. Significant enlargement of the central hepatic mass, with thrombus and tumor thrombus extending in the intrahepatic portal veins especially centrally, and with continued involvement of the middle hepatic vein. Tumor volume is estimated to have increased approximately 3.5 times since the prior scan. 2. Stable mild porta hepatis adenopathy. 3. Splenomegaly is mildly worsened. 4. Trace ascites especially along the liver margin. 5. Mild cardiomegaly.      03/28/2017 - 06/01/2017 Chemotherapy    Sorafenib start at 200mg  bid, and increase by 200mg /day every 2 weeks (full dose 400mg  bid) if she tolerates well. Started 03/28/17  Due to tolerance issues she will reduce Sorafenib dose to 2 in the morning and 1 in the evening 1 hour before meal starting 04/13/17  Increase to 400mg  twice daily starting 04/25/17  Stopped due to cancer progression       05/30/2017 Progression    MRI  results  IMPRESSION: 1. Marked progression of disease with significant enlargement of the previously noted central hepatic mass, and new multifocal disease throughout the liver, most pronounced throughout the left lobe which is now diffusely infiltrated. 2. Mildly enlarged hepatoduodenal ligament lymph node concerning for metastatic  disease. 3. Trace volume of ascites. 4. Splenomegaly. 5. Additional incidental findings, as above.      06/09/2017 - 06/23/2017 Antibody Plan    Nivolumab every 2 weeks starting 06/09/17; stopped due to worsening liver function       HISTORY OF PRESENTING ILLNESS (11/21/2015):  Megan Horton 76 y.o. female is here because of recent abnormal scan finding of a liver lesion.   She started having intermittent epigastric pain,  nausea, vomiting and weight loss 1.5 months ago, and presented to the ER on 10/10/2015 at [facility] for worsening symptoms. A CT scan was performed and she was noted to have 4.5 cm intralobar hypodense lesion in right love of liver and splenomegaly at 14.5 cm. There was also noted dilation of her portal vein consistent with portal HTN. She was sent to [facility] for further evaluation.She underwent endoscopy by Dr. Benson Norway on 10/10/2015, which was unremarkable. Abdominal MRI was subsequently obtained on 10/27/2015, which showed a 4.0 x 4.1 cm lesion in segment 8 of right liver lobe, with imaging features hepatocellular carcinoma. 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  She still has epigastric pain after meal, good appetite, gained some weight back, no nausea, mild constipation. She denies hematochezia, change of bowel habits, or other symptoms. She lives alone and tolerates daily activities without much difficulty.  She denies prior history of liver disease, no significant alcohol drinking history, no blood transfusion. She had a screening colonoscopy on 05/23/2015 which showed 5 small polyps which were removed.  CURRENT THERAPY: transition to Supportive care  INTERIM HISTORY:  Megan Horton returns for follow up. She presents to the clinic today with her son.  She reports to sleeping all the time. She has pain in her middle abdomen that has been about the same as before 9/10. Her son says she has tramadol but has not been taking it. She  hasn't taken any of her medications since her port placement 4 days ago. The doctor who did her port communicated that she was not to take medication but it ws only for the procedure.  She lives by herself. She is unable to clean her own home now. She needs someone to help her. Her urin color is dark tea color with a little red. She lives in an apartment, first level.   Her son reports she is becoming forgetful. He wants to know is there is a way to test her for dementia.    MEDICAL HISTORY:  Past Medical History:  Diagnosis Date  . Adhesive capsulitis of right shoulder   . Arthritis   . Complication of anesthesia 5/13   did have some disorientation post cerv fusion due to low sats  . Diabetes mellitus ([place])   . GERD (gastroesophageal reflux disease)   . Hyperlipemia   . Hypertension   . Hypothyroidism   . Left knee DJD   . Osteopenia   . Partial tear of rotator cuff(726.13) left  . Peripheral neuropathy    fingers   . Peripheral vascular disease ([place])   . Seasonal allergies   . Seasonal allergies   . Sleep apnea    CPAP, sleep study 3-4 years ago in [place], [place] Dr. Elbert Ewings   .  Urgency of urination   . Wears glasses     SURGICAL HISTORY: Past Surgical History:  Procedure Laterality Date  . ABDOMINAL HYSTERECTOMY     complete  . ANTERIOR CERVICAL DECOMP/DISCECTOMY FUSION  03/10/2012   Procedure: ANTERIOR CERVICAL DECOMPRESSION/DISCECTOMY FUSION 1 LEVEL;  Surgeon: Eustace Moore, MD;  Location: [facility];  Service: Neurosurgery;  Laterality: N/A;  Cervical five-six anterior cervical decompression fusion with plating  . CARPAL TUNNEL RELEASE     bilat  . CERVICAL FUSION    . CHOLECYSTECTOMY  2009  . COLONOSCOPY W/ POLYPECTOMY    . COLONOSCOPY WITH PROPOFOL N/A 05/23/2015   Procedure: COLONOSCOPY WITH PROPOFOL;  Surgeon: Carol Ada, MD;  Location: [facility];  Service: Endoscopy;  Laterality: N/A;  . ESOPHAGOGASTRODUODENOSCOPY (EGD) WITH PROPOFOL N/A 10/10/2015    Procedure: ESOPHAGOGASTRODUODENOSCOPY (EGD) WITH PROPOFOL;  Surgeon: Carol Ada, MD;  Location: [facility];  Service: Endoscopy;  Laterality: N/A;  . EYE SURGERY  2012   bilat cataract  . HEEL SPUR EXCISION     bilat  . HEMORRHOID SURGERY  2009  . IR FLUORO GUIDE PORT INSERTION RIGHT  06/20/2017  . IR GENERIC HISTORICAL  05/31/2016   IR ANGIOGRAM SELECTIVE EACH ADDITIONAL VESSEL 05/31/2016 Greggory Keen, MD [facility]  . IR GENERIC HISTORICAL  05/31/2016   IR ANGIOGRAM VISCERAL SELECTIVE 05/31/2016 Greggory Keen, MD [facility]  . IR GENERIC HISTORICAL  05/31/2016   IR US GUIDE VASC ACCESS RIGHT 05/31/2016 Greggory Keen, MD [facility]  . IR GENERIC HISTORICAL  05/31/2016   IR ANGIOGRAM SELECTIVE EACH ADDITIONAL VESSEL 05/31/2016 Greggory Keen, MD [facility]  . IR GENERIC HISTORICAL  05/31/2016   IR EMBO TUMOR ORGAN ISCHEMIA INFARCT INC GUIDE ROADMAPPING 05/31/2016 Greggory Keen, MD [facility]  . IR GENERIC HISTORICAL  10/18/2016   IR ANGIOGRAM SELECTIVE EACH ADDITIONAL VESSEL 10/18/2016 Greggory Keen, MD [facility]  . IR GENERIC HISTORICAL  10/18/2016   IR ANGIOGRAM SELECTIVE EACH ADDITIONAL VESSEL 10/18/2016 Greggory Keen, MD [facility]  . IR GENERIC HISTORICAL  10/18/2016   IR US GUIDE VASC ACCESS RIGHT 10/18/2016 Greggory Keen, MD [facility]  . IR GENERIC HISTORICAL  10/18/2016   IR ANGIOGRAM VISCERAL SELECTIVE 10/18/2016 Greggory Keen, MD [facility]  . IR GENERIC HISTORICAL  10/18/2016   IR EMBO ARTERIAL NOT HEMORR HEMANG INC GUIDE ROADMAPPING 10/18/2016 Greggory Keen, MD [facility]  . IR GENERIC HISTORICAL  10/18/2016   IR ANGIOGRAM SELECTIVE EACH ADDITIONAL VESSEL 10/18/2016 Greggory Keen, MD [facility]  . IR GENERIC HISTORICAL  10/18/2016   IR ANGIOGRAM SELECTIVE EACH ADDITIONAL VESSEL 10/18/2016 Greggory Keen, MD [facility]  . IR GENERIC HISTORICAL  10/05/2016   IR RADIOLOGIST EVAL & MGMT 10/05/2016 Greggory Keen, MD [facility]  . IR GENERIC  HISTORICAL  11/03/2016   IR EMBO TUMOR ORGAN ISCHEMIA INFARCT INC GUIDE ROADMAPPING 11/03/2016 Arne Cleveland, MD [facility]  . IR GENERIC HISTORICAL  11/03/2016   IR ANGIOGRAM VISCERAL SELECTIVE 11/03/2016 Arne Cleveland, MD [facility]  . IR GENERIC HISTORICAL  11/03/2016   IR ANGIOGRAM SELECTIVE EACH ADDITIONAL VESSEL 11/03/2016 Arne Cleveland, MD [facility]  . IR GENERIC HISTORICAL  11/03/2016   IR US GUIDE VASC ACCESS RIGHT 11/03/2016 Arne Cleveland, MD [facility]  . IR GENERIC HISTORICAL  11/03/2016   IR ANGIOGRAM SELECTIVE EACH ADDITIONAL VESSEL 11/03/2016 Arne Cleveland, MD [facility]  . IR GENERIC HISTORICAL  11/03/2016   IR ANGIOGRAM SELECTIVE EACH ADDITIONAL VESSEL 11/03/2016 Arne Cleveland, MD [facility]  .  IR GENERIC HISTORICAL  11/03/2016   IR ANGIOGRAM SELECTIVE EACH ADDITIONAL VESSEL 11/03/2016 Arne Cleveland, MD [facility]  . IR GENERIC HISTORICAL  07/07/2016   IR RADIOLOGIST EVAL & MGMT 07/07/2016 Greggory Keen, MD [facility]  . IR GENERIC HISTORICAL  05/18/2016   IR RADIOLOGIST EVAL & MGMT 05/18/2016 [facility]  . IR GENERIC HISTORICAL  12/08/2016   IR RADIOLOGIST EVAL & MGMT 12/08/2016 Greggory Keen, MD [facility]  . IR RADIOLOGIST EVAL & MGMT  03/01/2017  . IR US GUIDE VASC ACCESS RIGHT  06/20/2017  . KNEE ARTHROSCOPY     Left x2  . NOSE SURGERY    . SHOULDER ARTHROSCOPY  06/27/2012   Procedure: ARTHROSCOPY SHOULDER;  Surgeon: Lorn Junes, MD;  Location: [facility];  Service: Orthopedics;  Laterality: Right;  right shoulder arthroscopy debridement extensive, distal clavulectomy, with resect adhesions with maniplulation  . TONSILLECTOMY    . TOTAL KNEE ARTHROPLASTY Left 07/02/2013   Procedure: TOTAL KNEE ARTHROPLASTY- left;  Surgeon: Lorn Junes, MD;  Location: [facility];  Service: Orthopedics;  Laterality: Left;  Marland Kitchen VARICOSE VEIN SURGERY     Right    SOCIAL HISTORY: Social History   Social History  . Marital  status: Widowed    Spouse name: N/A  . Number of children: N/A  . Years of education: N/A   Occupational History  . retired    Social History Main Topics  . Smoking status: Former Smoker    Packs/day: 0.50    Years: 35.00    Types: Cigarettes    Quit date: 12/26/1993  . Smokeless tobacco: Never Used  . Alcohol use Yes     Comment: very rarely wine, on holidays   . Drug use: No  . Sexual activity: Not on file   Other Topics Concern  . Not on file   Social History Narrative  . No narrative on file    FAMILY HISTORY: Family History  Problem Relation Age of Onset  . Heart disease Mother   . Hypertension Mother   . Stroke Mother   . Heart disease Father   . Heart attack Father   . Hypertension Father   . Heart disease Brother   . Heart attack Brother   . Heart attack Sister   . Hypertension Sister   . Stroke Brother     ALLERGIES:  is allergic to asa [aspirin]; adhesive [tape]; and tylenol [acetaminophen].  MEDICATIONS:  Current Outpatient Prescriptions  Medication Sig Dispense Refill  . alendronate (FOSAMAX) 70 MG tablet Take 70 mg by mouth once a week.     Marland Kitchen amLODipine (NORVASC) 5 MG tablet Take 5 mg by mouth every morning.     . B-D UF III MINI PEN NEEDLES 31G X 5 MM MISC     . BD PEN NEEDLE NANO U/F 32G X 4 MM MISC     . calcium-vitamin D (OSCAL WITH D) 250-125 MG-UNIT tablet Take 1 tablet by mouth daily.    Marland Kitchen COLCRYS 0.6 MG tablet Take 0.6 mg by mouth daily.     . cycloSPORINE (RESTASIS) 0.05 % ophthalmic emulsion Place 1 drop into both eyes 2 (two) times daily.    Marland Kitchen dexlansoprazole (DEXILANT) 60 MG capsule Take 60 mg by mouth every evening.     . docusate sodium (COLACE) 100 MG capsule Take 1 capsule (100 mg total) by mouth daily as needed for mild constipation. (Patient not taking: Reported on 06/23/2017) 10 capsule 0  .  DULoxetine (CYMBALTA) 60 MG capsule Take 60 mg by mouth every evening.     Marland Kitchen esomeprazole (NEXIUM) 40 MG capsule Take 40 mg by mouth every  morning.     . fluticasone (FLONASE) 50 MCG/ACT nasal spray Place 1 spray into both nostrils 2 (two) times daily as needed for allergies or rhinitis.     Marland Kitchen FREESTYLE LITE test strip     . furosemide (LASIX) 20 MG tablet Take 20 mg by mouth daily. Reported on 02/10/2016    . gabapentin (NEURONTIN) 300 MG capsule Take 300 mg by mouth at bedtime.     . hydrochlorothiazide (MICROZIDE) 12.5 MG capsule Take 12.5 mg by mouth daily.     . insulin aspart protamine- aspart (NOVOLOG MIX 70/30) (70-30) 100 UNIT/ML injection Inject 8 Units into the skin daily with breakfast.    . insulin aspart protamine- aspart (NOVOLOG MIX 70/30) (70-30) 100 UNIT/ML injection Inject 10 Units into the skin daily with supper.    . levothyroxine (SYNTHROID, LEVOTHROID) 112 MCG tablet Take 112 mcg by mouth daily.    Marland Kitchen lidocaine-prilocaine (EMLA) cream Apply to affected area once (Patient not taking: Reported on 06/23/2017) 30 g 3  . loperamide (IMODIUM A-D) 2 MG tablet Take 2 mg by mouth at bedtime.    . lovastatin (MEVACOR) 40 MG tablet Take 40 mg by mouth at bedtime.    . Multiple Vitamins-Minerals (CENTRUM SILVER PO) Take 1 tablet by mouth every morning.     . Olopatadine HCl (PATANASE) 0.6 % SOLN Place 1 each into the nose daily as needed (for allergies).    . Omega-3 Fatty Acids (FISH OIL PO) Take 1 tablet by mouth daily.    Marland Kitchen oxyCODONE (OXY IR/ROXICODONE) 5 MG immediate release tablet Take 1 tablet (5 mg total) by mouth every 6 (six) hours as needed for severe pain. 30 tablet 0  . ranitidine (ZANTAC) 150 MG tablet Take 150 mg by mouth at bedtime. Reported on 11/21/2015    . telmisartan (MICARDIS) 80 MG tablet Take 80 mg by mouth every morning.     Marland Kitchen tetrahydrozoline (VISINE) 0.05 % ophthalmic solution Place 1 drop into both eyes 2 (two) times daily as needed (for allergies).     . traMADol (ULTRAM) 50 MG tablet Take 1 tablet (50 mg total) by mouth every 8 (eight) hours as needed. (Patient not taking: Reported on 06/23/2017) 30  tablet 1   No current facility-administered medications for this visit.    Facility-Administered Medications Ordered in Other Visits  Medication Dose Route Frequency Provider Last Rate Last Dose  . sodium chloride flush (NS) 0.9 % injection 10 mL  10 mL Intravenous PRN Truitt Merle, MD        REVIEW OF SYSTEMS:   Constitutional: Denies fevers, chills or abnormal night sweats (+) low appetite (+) fatigue (+) weight loss Eyes: Denies blurriness of vision, double vision or watery eyes Ears, nose, mouth, throat, and face: Denies mucositis or sore throat Respiratory: Denies cough, dyspnea or wheezes Cardiovascular: Denies palpitation, chest discomfort (+) lower extremity swelling Gastrointestinal:  Denies nausea, heartburn or change in bowel habits (+) URQ and ULQ pain (+) stomach bloating  (+) Dark tea color urine (+) constipation  Skin:  (+) rash/bumps on chest secondary to liver cirrhosis (+) jaundice  Lymphatics: Denies new lymphadenopathy or easy bruising Neurological:Denies numbness, tingling or new weaknesses MSK: (+) back pain  Behavioral/Psych: Mood is stable, no new changes  All other systems were reviewed with the patient and are negative.  PHYSICAL EXAMINATION: ECOG PERFORMANCE STATUS: 3  Vitals:   06/23/17 1414  BP: (!) 127/51  Pulse: 95  Resp: 18  Temp: 98.7 F (37.1 C)  SpO2: 97%   Filed Weights   06/23/17 1414  Weight: 216 lb 3.2 oz (98.1 kg)    GENERAL:alert, no distress and comfortable SKIN: skin color, texture, turgor are normal, no rashes or significant lesions EYES: normal, conjunctiva are pink and non-injected, sclera clear OROPHARYNX:no exudate, no erythema and lips, buccal mucosa, and tongue normal  NECK: supple, thyroid normal size, non-tender, without nodularity LYMPH:  no palpable lymphadenopathy in the cervical, axillary or inguinal LUNGS: clear to auscultation and percussion with normal breathing effort HEART: regular rate & rhythm and no murmurs  and no lower extremity edema ABDOMEN:abdomen soft, non-tender and normal bowel sounds (+) tenderness in epigastric area, no palpable mass Musculoskeletal:no cyanosis of digits and no clubbing  PSYCH: alert & oriented x 3 with fluent speech NEURO: no focal motor/sensory deficits  LABORATORY DATA:  I have reviewed the data as listed CBC Latest Ref Rng & Units 06/23/2017 06/17/2017 06/09/2017  WBC 3.9 - 10.3 10e3/uL 6.0 8.6 7.1  Hemoglobin 11.6 - 15.9 g/dL 12.2 12.4 12.4  Hematocrit 34.8 - 46.6 % 36.0 35.2(L) 37.6  Platelets 145 - 400 10e3/uL 137(L) 177 112(L)    Recent Labs  10/18/16 0902 11/03/16 0732 02/24/17 1106  06/01/17 1307 06/09/17 1147 06/23/17 1306  NA 137 140 140  < > 137 133* 127*  K 3.7 3.7 3.9  < > 3.9 3.8 4.5  CL 105 105 106  --   --   --   --   CO2 25 24 26   < > 22 24 16*  GLUCOSE 169* 161* 160*  < > 168* 186* 158*  BUN 13 14 10   < > 14.3 28.6* 39.5*  CREATININE 0.70 0.64 0.64  < > 0.8 0.8 0.9  CALCIUM 9.2 8.9 8.6  < > 8.1* 8.6 8.4  GFRNONAA >60 >60  --   --   --   --   --   GFRAA >60 >60  --   --   --   --   --   PROT 6.9 6.9 6.4  < > 6.9 6.9 6.4  ALBUMIN 3.6 3.4* 3.3*  < > 1.9* 1.9* 1.5*  AST 50* 39 38*  < > 154* 213* 249 Repeated and Verified*  ALT 43 41 27  < > 82* 99* 76*  ALKPHOS 133* 138* 141*  < > 462* 638* 597*  BILITOT 0.7 0.7 0.9  < > 3.83* 6.46* 10.52 Repeated and Verified*  < > = values in this interval not displayed.    AFP  11/21/15: 124.5 04/12/16: 100.5 02/24/17: 2302.8 04/13/17: 7799 05/13/17: 16,347 06/09/17: 96,295.2   PATHOLOGY REPORT  Diagnosis 12/01/2015 Liver, needle/core biopsy, central right mass HEPATOCELLULAR CARCINOMA, MODERATELY DIFFERENTIATED. Microscopic Comment The biopsy shows diffuse malignant lesions which composed of well and moderately differentiated neoplastic hepatocytes. The carcinoma stains focal positive for hepar-1, negative for ck7. Reticulin and cd 34 highlights the proliferation pattern. The morphology and  immunostaining pattern support the above diagnosis. This case also reviewed by Dr. Saralyn Pilar and agree.   RADIOGRAPHIC STUDIES: I have personally reviewed the radiological images as listed and agreed with the findings in the report.  MRI Abdomen w wow Contrast 05/30/17 IMPRESSION: 1. Marked progression of disease with significant enlargement of the previously noted central hepatic mass, and new multifocal disease throughout the liver, most pronounced throughout the  left lobe which is now diffusely infiltrated. 2. Mildly enlarged hepatoduodenal ligament lymph node concerning for metastatic disease. 3. Trace volume of ascites. 4. Splenomegaly. 5. Additional incidental findings, as above.   CT Chest WO Contrast 04/13/17 IMPRESSION: 1. Stable mild prevascular item borderline paraesophageal lymphadenopathy since 11/25/2015. 2. No evidence for pulmonary metastases. 3. Coronary artery atherosclerosis.  MRI abdomen with and without contrast 03/01/2017 IMPRESSION: 1. Significant enlargement of the central hepatic mass, with thrombus and tumor thrombus extending in the intrahepatic portal veins especially centrally, and with continued involvement of the middle hepatic vein. Tumor volume is estimated to have increased approximately 3.5 times since the prior scan. 2. Stable mild porta hepatis adenopathy. 3. Splenomegaly is mildly worsened. 4. Trace ascites especially along the liver margin. 5. Mild cardiomegaly.  MRI abdomen with and without contrast 10/05/2016 IMPRESSION: 1. Mild interval increase in size of central RIGHT hepatic lobe mass. 2. Interval extension of the tumor mass into the middle hepatic vein consistent with tumor progression. 3. Splenomegaly.  Portal vein is patent.  No ascites.    ASSESSMENT & PLAN: 76 y.o. female with PMH of HTN, DM, arthritis, hypothyroidism, obesity, buthistory of liver disease or heavy drinking history, presented with epigastric pain, nausea,  and weight loss. Initial abdominal CT and MRI showed a 4.1 cm mass in the right lobe of liver  1. Hepatocellular carcinoma of rght lobe, recurrent  -I previously reviewed the CT and MRI findings with pt in details. The liver lesion was very concerning for malignancy, especially hepatocellular carcinoma. Although she does not have history of liver disease, image also showed portal hypertension and a mild liver cirrhosis, which is her high risk factor for [place].  -I previously reviewed her liver mass biopsy result, which confirmed [place].  -CT chest on 11/25/15 was negative for metastasis. This was previously discussed with patient -She is status post TACE by interventional radiology Dr. Annamaria Boots on 01/19/16, posttreatment MRI on 02/13/16 showed good partial response.  -MRI of the abd on 05/18/16 was stable. -The patient had repeat visceral angiograms with peripheral right hepatic DEB- TACE todominant right hepatic vasculature supplying the known hepatocellular carcinoma segment 8 on 05/31/16 by Dr. Annamaria Boots. -MRI of the abd on 10/05/16 showed mild interval increase of size of the central right hepatic lobe mass and interval extension of the tumor mass into the middle hepatic vein consistent with tumor progression. -The patient underwent Y-90 radioembolization to the LEFT hepatic lobe by Dr. Vernard Gambles on 11/03/16. -MRI of the abd on 03/01/17 showed significant enlargement of the central hepatic mass with thrombus and tumor thrombus extending in the intrahepatic portal veins. Tumor volume is estimated to have increased approximately 3.5 times since the prior scan. Stable mild porta hepatis adenopathy. Splenomegaly is mildly worsened. Dr. Annamaria Boots does not think she is a candidate for more liver targeted therapy  -The patient's AFP has significantly increased from 100.5 in June 2017 to 2302.8 in April 2018. --she has started first line systemic therapy with sorafenib, tolerated not very well and had dose reduction -We reviewed  her MRI from 05/30/17 and it shows significant cancer progression in liver with new lesions.  -She has started second line treatment with Nivolumab, had one nivolumab treatment.  -Unfortunately, she has deteriorated quickly lately. Her performance status is very poor, she sleeps most of time during day, with slow appetite and severe abdominal pain. She appears to be jaundice and lethargic today.  -labs reviewed and her Bilirubin has elevated 10.52 and her AFP  tumor  marker is over 26K which is evident of her worsening liver function and her liver cancer. Based on her liver function and her body weakness I do not think further treatment will help her condition. Will stop nivolumab.  -We had a long discussion about the goal of her care, which is palliative. Giving the no cancer treatment at this point, and her poor prognosis, I recommend hospice and comfort care. She lives alone, her son and daughter has started talking about a plan to take care of her at home. After a lengthy discussion, patient agreed with home hospice. I'll refer her today. -We discussed symptom management, I'll start her on oxycodone 5 mg every 6 hours as needed for her abdominal pain. -We discussed the medications she is fine to stop. Hold: Amlodipine, Neurontin, HTCZ, Lovastatin, and Telmisartin and use Novolog only if BG is >150.  -She knows she can contact us if she needs anything.    2. 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 -We discussed important of avoid constipation, she will use over-the-counter laxatives    3. HTN, DM, hypothyroidism -She'll continue follow-up with her primary care physician -The patient is now off Metformin and is taking insulin. -Due to her much worsening liver function, poor by mouth intake, I'll hold on her blood pressure medication for now. She has been  not taking the for the past 4 days and her blood pressure is normal today. -She knows to check her blood glucose before she take insulin. We discussed hypoglycemia from insulin    4. Upper Quadrant abdominal pain.  -Secondary to his liver cancer -I encouraged her to eat smaller portions.  -For her pain I suggest she uses Zantac and pain medication aleve since she cannot tolerate tylenol/tramdaol.  -Pain is now ongoing whenever she eats. She has lost 12-13 pounds due to this. I give her order tramadol to take up to 3 times a day.  -Her pain is 9/10, I will prescribe her to low dose, starting half tablets of oxycodone to control her pain. In home nurse will help titrate medication.  -To help her constipation I suggest she make sure she has regular BM so she should take mirlax as needed.   5. Goal of care discussion  -We again discussed the incurable nature of her cancer, and the overall poor prognosis, especially now she has significant cancer progression through treatment -The patient understands the goal of care is palliative. -I recommend DNR/DNI, she will think about it  -I suggest supportive care at home or if her she is getting worse she can do inpatient hospice care.  -Pt prefers in home supportive care. I will send referral   PLAN -Due to her much worsening liver function and performance status, I'll stop Nivolumab treatment.  -Refer to [facility] program, I'll remain to be her M.D. when she is on hospice care. -f/u open, she knows to call us if she has a concerns.   All questions were answered. The patient knows to call the clinic with any problems, questions or concerns.  I spent 30 minutes counseling the patient face to face. The total time spent in the appointment was 40 minutes and more than 50% was on counseling.     Truitt Merle, MD 06/23/2017   This document serves as a record of services personally performed by Truitt Merle, MD. It was created on her behalf  by Joslyn Devon, a trained medical scribe. The creation of this record is based on the scribe's personal observations and the provider's statements to them. This document has been checked and approved by the attending provider.

## 2017-06-25 DIAGNOSIS — G619 Inflammatory polyneuropathy, unspecified: Secondary | ICD-10-CM | POA: Diagnosis not present

## 2017-06-25 DIAGNOSIS — I739 Peripheral vascular disease, unspecified: Secondary | ICD-10-CM | POA: Diagnosis not present

## 2017-06-25 DIAGNOSIS — G4733 Obstructive sleep apnea (adult) (pediatric): Secondary | ICD-10-CM | POA: Diagnosis not present

## 2017-06-25 DIAGNOSIS — K219 Gastro-esophageal reflux disease without esophagitis: Secondary | ICD-10-CM | POA: Diagnosis not present

## 2017-06-25 DIAGNOSIS — R0600 Dyspnea, unspecified: Secondary | ICD-10-CM | POA: Diagnosis not present

## 2017-06-25 DIAGNOSIS — R634 Abnormal weight loss: Secondary | ICD-10-CM | POA: Diagnosis not present

## 2017-06-25 DIAGNOSIS — M810 Age-related osteoporosis without current pathological fracture: Secondary | ICD-10-CM | POA: Diagnosis not present

## 2017-06-25 DIAGNOSIS — E039 Hypothyroidism, unspecified: Secondary | ICD-10-CM | POA: Diagnosis not present

## 2017-06-25 DIAGNOSIS — R630 Anorexia: Secondary | ICD-10-CM | POA: Diagnosis not present

## 2017-06-25 DIAGNOSIS — R1013 Epigastric pain: Secondary | ICD-10-CM | POA: Diagnosis not present

## 2017-06-25 DIAGNOSIS — I10 Essential (primary) hypertension: Secondary | ICD-10-CM | POA: Diagnosis not present

## 2017-06-25 DIAGNOSIS — R17 Unspecified jaundice: Secondary | ICD-10-CM | POA: Diagnosis not present

## 2017-06-25 DIAGNOSIS — J449 Chronic obstructive pulmonary disease, unspecified: Secondary | ICD-10-CM | POA: Diagnosis not present

## 2017-06-25 DIAGNOSIS — E785 Hyperlipidemia, unspecified: Secondary | ICD-10-CM | POA: Diagnosis not present

## 2017-06-25 DIAGNOSIS — R609 Edema, unspecified: Secondary | ICD-10-CM | POA: Diagnosis not present

## 2017-06-25 DIAGNOSIS — C228 Malignant neoplasm of liver, primary, unspecified as to type: Secondary | ICD-10-CM | POA: Diagnosis not present

## 2017-06-25 DIAGNOSIS — E119 Type 2 diabetes mellitus without complications: Secondary | ICD-10-CM | POA: Diagnosis not present

## 2017-06-25 DIAGNOSIS — M109 Gout, unspecified: Secondary | ICD-10-CM | POA: Diagnosis not present

## 2017-06-26 DIAGNOSIS — R0600 Dyspnea, unspecified: Secondary | ICD-10-CM | POA: Diagnosis not present

## 2017-06-26 DIAGNOSIS — C228 Malignant neoplasm of liver, primary, unspecified as to type: Secondary | ICD-10-CM | POA: Diagnosis not present

## 2017-06-26 DIAGNOSIS — R630 Anorexia: Secondary | ICD-10-CM | POA: Diagnosis not present

## 2017-06-26 DIAGNOSIS — R609 Edema, unspecified: Secondary | ICD-10-CM | POA: Diagnosis not present

## 2017-06-26 DIAGNOSIS — R17 Unspecified jaundice: Secondary | ICD-10-CM | POA: Diagnosis not present

## 2017-06-26 DIAGNOSIS — R634 Abnormal weight loss: Secondary | ICD-10-CM | POA: Diagnosis not present

## 2017-06-27 DIAGNOSIS — R17 Unspecified jaundice: Secondary | ICD-10-CM | POA: Diagnosis not present

## 2017-06-27 DIAGNOSIS — C228 Malignant neoplasm of liver, primary, unspecified as to type: Secondary | ICD-10-CM | POA: Diagnosis not present

## 2017-06-27 DIAGNOSIS — R609 Edema, unspecified: Secondary | ICD-10-CM | POA: Diagnosis not present

## 2017-06-27 DIAGNOSIS — R634 Abnormal weight loss: Secondary | ICD-10-CM | POA: Diagnosis not present

## 2017-06-27 DIAGNOSIS — R0600 Dyspnea, unspecified: Secondary | ICD-10-CM | POA: Diagnosis not present

## 2017-06-27 DIAGNOSIS — R630 Anorexia: Secondary | ICD-10-CM | POA: Diagnosis not present

## 2017-06-28 DIAGNOSIS — R634 Abnormal weight loss: Secondary | ICD-10-CM | POA: Diagnosis not present

## 2017-06-28 DIAGNOSIS — R0600 Dyspnea, unspecified: Secondary | ICD-10-CM | POA: Diagnosis not present

## 2017-06-28 DIAGNOSIS — R630 Anorexia: Secondary | ICD-10-CM | POA: Diagnosis not present

## 2017-06-28 DIAGNOSIS — R17 Unspecified jaundice: Secondary | ICD-10-CM | POA: Diagnosis not present

## 2017-06-28 DIAGNOSIS — C228 Malignant neoplasm of liver, primary, unspecified as to type: Secondary | ICD-10-CM | POA: Diagnosis not present

## 2017-06-28 DIAGNOSIS — R609 Edema, unspecified: Secondary | ICD-10-CM | POA: Diagnosis not present

## 2017-06-29 DIAGNOSIS — Z8669 Personal history of other diseases of the nervous system and sense organs: Secondary | ICD-10-CM | POA: Diagnosis not present

## 2017-06-29 DIAGNOSIS — R609 Edema, unspecified: Secondary | ICD-10-CM | POA: Diagnosis not present

## 2017-06-29 DIAGNOSIS — R630 Anorexia: Secondary | ICD-10-CM | POA: Diagnosis not present

## 2017-06-29 DIAGNOSIS — Z8709 Personal history of other diseases of the respiratory system: Secondary | ICD-10-CM | POA: Diagnosis not present

## 2017-06-29 DIAGNOSIS — Z8719 Personal history of other diseases of the digestive system: Secondary | ICD-10-CM | POA: Diagnosis not present

## 2017-06-29 DIAGNOSIS — C220 Liver cell carcinoma: Secondary | ICD-10-CM | POA: Diagnosis not present

## 2017-06-29 DIAGNOSIS — R0600 Dyspnea, unspecified: Secondary | ICD-10-CM | POA: Diagnosis not present

## 2017-06-29 DIAGNOSIS — Z8639 Personal history of other endocrine, nutritional and metabolic disease: Secondary | ICD-10-CM | POA: Diagnosis not present

## 2017-06-29 DIAGNOSIS — C228 Malignant neoplasm of liver, primary, unspecified as to type: Secondary | ICD-10-CM | POA: Diagnosis not present

## 2017-06-29 DIAGNOSIS — R634 Abnormal weight loss: Secondary | ICD-10-CM | POA: Diagnosis not present

## 2017-06-29 DIAGNOSIS — R17 Unspecified jaundice: Secondary | ICD-10-CM | POA: Diagnosis not present

## 2017-06-30 DIAGNOSIS — Z8639 Personal history of other endocrine, nutritional and metabolic disease: Secondary | ICD-10-CM | POA: Diagnosis not present

## 2017-06-30 DIAGNOSIS — R630 Anorexia: Secondary | ICD-10-CM | POA: Diagnosis not present

## 2017-06-30 DIAGNOSIS — Z8709 Personal history of other diseases of the respiratory system: Secondary | ICD-10-CM | POA: Diagnosis not present

## 2017-06-30 DIAGNOSIS — R17 Unspecified jaundice: Secondary | ICD-10-CM | POA: Diagnosis not present

## 2017-06-30 DIAGNOSIS — R634 Abnormal weight loss: Secondary | ICD-10-CM | POA: Diagnosis not present

## 2017-06-30 DIAGNOSIS — C220 Liver cell carcinoma: Secondary | ICD-10-CM | POA: Diagnosis not present

## 2017-07-01 DIAGNOSIS — R634 Abnormal weight loss: Secondary | ICD-10-CM | POA: Diagnosis not present

## 2017-07-01 DIAGNOSIS — Z8709 Personal history of other diseases of the respiratory system: Secondary | ICD-10-CM | POA: Diagnosis not present

## 2017-07-01 DIAGNOSIS — R630 Anorexia: Secondary | ICD-10-CM | POA: Diagnosis not present

## 2017-07-01 DIAGNOSIS — Z8639 Personal history of other endocrine, nutritional and metabolic disease: Secondary | ICD-10-CM | POA: Diagnosis not present

## 2017-07-01 DIAGNOSIS — R17 Unspecified jaundice: Secondary | ICD-10-CM | POA: Diagnosis not present

## 2017-07-01 DIAGNOSIS — C220 Liver cell carcinoma: Secondary | ICD-10-CM | POA: Diagnosis not present

## 2017-07-09 DEATH — deceased

## 2017-07-22 ENCOUNTER — Encounter: Payer: Self-pay | Admitting: Pulmonary Disease

## 2017-08-19 ENCOUNTER — Ambulatory Visit: Payer: Medicare Other | Admitting: Pulmonary Disease
